# Patient Record
Sex: Male | Born: 1950
Health system: Southern US, Community
[De-identification: ages and names within clinical notes are randomized; demographics above are authoritative.]

## PROBLEM LIST (undated history)

## (undated) DIAGNOSIS — N201 Calculus of ureter: Secondary | ICD-10-CM

## (undated) DIAGNOSIS — R399 Unspecified symptoms and signs involving the genitourinary system: Secondary | ICD-10-CM

## (undated) DIAGNOSIS — Z86010 Personal history of colonic polyps: Secondary | ICD-10-CM

## (undated) DIAGNOSIS — R030 Elevated blood-pressure reading, without diagnosis of hypertension: Secondary | ICD-10-CM

## (undated) DIAGNOSIS — M1990 Unspecified osteoarthritis, unspecified site: Secondary | ICD-10-CM

## (undated) DIAGNOSIS — E291 Testicular hypofunction: Secondary | ICD-10-CM

## (undated) DIAGNOSIS — I10 Essential (primary) hypertension: Secondary | ICD-10-CM

## (undated) DIAGNOSIS — Z87442 Personal history of urinary calculi: Secondary | ICD-10-CM

## (undated) DIAGNOSIS — M545 Low back pain, unspecified: Secondary | ICD-10-CM

## (undated) DIAGNOSIS — Z860101 Personal history of adenomatous and serrated colon polyps: Secondary | ICD-10-CM

## (undated) DIAGNOSIS — I4510 Unspecified right bundle-branch block: Secondary | ICD-10-CM

## (undated) DIAGNOSIS — C679 Malignant neoplasm of bladder, unspecified: Secondary | ICD-10-CM

## (undated) DIAGNOSIS — G8929 Other chronic pain: Secondary | ICD-10-CM

## (undated) DIAGNOSIS — N400 Enlarged prostate without lower urinary tract symptoms: Secondary | ICD-10-CM

## (undated) DIAGNOSIS — E7800 Pure hypercholesterolemia, unspecified: Secondary | ICD-10-CM

## (undated) DIAGNOSIS — N529 Male erectile dysfunction, unspecified: Secondary | ICD-10-CM

## (undated) HISTORY — DX: Essential (primary) hypertension: I10

## (undated) HISTORY — PX: TONSILLECTOMY: SUR1361

## (undated) HISTORY — PX: COLONOSCOPY: SHX174

## (undated) HISTORY — PX: REFRACTIVE SURGERY: SHX103

---

## 1987-09-27 HISTORY — PX: TRANSURETHRAL RESECTION OF BLADDER TUMOR WITH GYRUS (TURBT-GYRUS): SHX6458

## 1987-09-27 HISTORY — PX: CYSTOSCOPY/RETROGRADE/URETEROSCOPY/STONE EXTRACTION WITH BASKET: SHX5317

## 1998-09-16 ENCOUNTER — Emergency Department (HOSPITAL_COMMUNITY): Admission: EM | Admit: 1998-09-16 | Discharge: 1998-09-16 | Payer: Self-pay | Admitting: Emergency Medicine

## 1999-05-02 ENCOUNTER — Encounter: Payer: Self-pay | Admitting: Urology

## 1999-05-02 ENCOUNTER — Emergency Department (HOSPITAL_COMMUNITY): Admission: EM | Admit: 1999-05-02 | Discharge: 1999-05-02 | Payer: Self-pay | Admitting: Emergency Medicine

## 2000-09-06 ENCOUNTER — Encounter: Payer: Self-pay | Admitting: Urology

## 2000-09-06 ENCOUNTER — Encounter: Admission: RE | Admit: 2000-09-06 | Discharge: 2000-09-06 | Payer: Self-pay | Admitting: Urology

## 2000-09-12 ENCOUNTER — Encounter: Admission: RE | Admit: 2000-09-12 | Discharge: 2000-09-12 | Payer: Self-pay | Admitting: Urology

## 2000-09-12 ENCOUNTER — Encounter: Payer: Self-pay | Admitting: Urology

## 2000-09-26 HISTORY — PX: PROSTATE SURGERY: SHX751

## 2002-10-28 ENCOUNTER — Encounter: Admission: RE | Admit: 2002-10-28 | Discharge: 2002-10-28 | Payer: Self-pay | Admitting: Urology

## 2002-10-28 ENCOUNTER — Encounter: Payer: Self-pay | Admitting: Urology

## 2003-10-20 ENCOUNTER — Ambulatory Visit (HOSPITAL_COMMUNITY): Admission: RE | Admit: 2003-10-20 | Discharge: 2003-10-20 | Payer: Self-pay | Admitting: Gastroenterology

## 2005-06-24 ENCOUNTER — Ambulatory Visit (HOSPITAL_COMMUNITY): Admission: RE | Admit: 2005-06-24 | Discharge: 2005-06-24 | Payer: Self-pay | Admitting: Family Medicine

## 2005-06-28 ENCOUNTER — Emergency Department (HOSPITAL_COMMUNITY): Admission: EM | Admit: 2005-06-28 | Discharge: 2005-06-28 | Payer: Self-pay | Admitting: Emergency Medicine

## 2008-04-15 ENCOUNTER — Ambulatory Visit: Payer: Self-pay | Admitting: Internal Medicine

## 2008-04-29 ENCOUNTER — Encounter: Payer: Self-pay | Admitting: Internal Medicine

## 2008-04-29 ENCOUNTER — Ambulatory Visit: Payer: Self-pay | Admitting: Internal Medicine

## 2008-05-01 ENCOUNTER — Encounter: Payer: Self-pay | Admitting: Internal Medicine

## 2011-09-27 HISTORY — PX: SHOULDER ARTHROSCOPY WITH SUBACROMIAL DECOMPRESSION, ROTATOR CUFF REPAIR AND BICEP TENDON REPAIR: SHX5687

## 2012-05-18 ENCOUNTER — Ambulatory Visit (HOSPITAL_COMMUNITY)
Admission: RE | Admit: 2012-05-18 | Discharge: 2012-05-18 | Disposition: A | Discharge status: Home / Self Care | Payer: Worker's Compensation | Source: Ambulatory Visit | Attending: Orthopedic Surgery | Admitting: Orthopedic Surgery

## 2012-05-18 ENCOUNTER — Other Ambulatory Visit (HOSPITAL_COMMUNITY): Payer: Self-pay | Admitting: Orthopedic Surgery

## 2012-05-18 DIAGNOSIS — T1590XA Foreign body on external eye, part unspecified, unspecified eye, initial encounter: Secondary | ICD-10-CM

## 2012-05-18 DIAGNOSIS — Z1389 Encounter for screening for other disorder: Secondary | ICD-10-CM | POA: Insufficient documentation

## 2012-10-05 ENCOUNTER — Other Ambulatory Visit (HOSPITAL_COMMUNITY): Payer: Self-pay | Admitting: Cardiovascular Disease

## 2012-10-05 DIAGNOSIS — I10 Essential (primary) hypertension: Secondary | ICD-10-CM

## 2012-10-05 DIAGNOSIS — I4510 Unspecified right bundle-branch block: Secondary | ICD-10-CM

## 2012-10-05 DIAGNOSIS — IMO0001 Reserved for inherently not codable concepts without codable children: Secondary | ICD-10-CM

## 2012-10-05 DIAGNOSIS — I714 Abdominal aortic aneurysm, without rupture: Secondary | ICD-10-CM

## 2012-10-05 DIAGNOSIS — I493 Ventricular premature depolarization: Secondary | ICD-10-CM

## 2012-10-16 ENCOUNTER — Ambulatory Visit (HOSPITAL_COMMUNITY)
Admission: RE | Admit: 2012-10-16 | Discharge: 2012-10-16 | Disposition: A | Discharge status: Home / Self Care | Payer: BC Managed Care – PPO | Source: Ambulatory Visit | Attending: Cardiovascular Disease | Admitting: Cardiovascular Disease

## 2012-10-16 DIAGNOSIS — I493 Ventricular premature depolarization: Secondary | ICD-10-CM

## 2012-10-16 DIAGNOSIS — I10 Essential (primary) hypertension: Secondary | ICD-10-CM

## 2012-10-16 DIAGNOSIS — I4510 Unspecified right bundle-branch block: Secondary | ICD-10-CM

## 2012-10-16 DIAGNOSIS — Z8249 Family history of ischemic heart disease and other diseases of the circulatory system: Secondary | ICD-10-CM | POA: Insufficient documentation

## 2012-10-16 HISTORY — PX: TRANSTHORACIC ECHOCARDIOGRAM: SHX275

## 2012-10-16 HISTORY — PX: CARDIOVASCULAR STRESS TEST: SHX262

## 2012-10-16 MED ORDER — TECHNETIUM TC 99M SESTAMIBI GENERIC - CARDIOLITE
10.8000 | Freq: Once | INTRAVENOUS | Status: AC | PRN
  Administered 2012-10-16: 11 via INTRAVENOUS

## 2012-10-16 MED ORDER — TECHNETIUM TC 99M SESTAMIBI GENERIC - CARDIOLITE
30.5000 | Freq: Once | INTRAVENOUS | Status: AC | PRN
  Administered 2012-10-16: 31 via INTRAVENOUS

## 2012-10-16 NOTE — Progress Notes (Signed)
2D Echo Performed 08/26/2013    Myrakle Wingler, RCS.  

## 2012-10-16 NOTE — Procedures (Addendum)
[facility] [address] [place] [address] [place] [address] [phone]  Cardiology Nuclear Med Study  Ryan Scott is a 62 y.o. male     MRN : [id number]     DOB: [birth year]  Procedure Date: 10/16/2012  Nuclear Med Background Indication for Stress Test:  Evaluation for Ischemia History:  no prior cardiac history Cardiac Risk Factors: Family History - CAD, Hypertension, Lipids and Overweight  Symptoms:  none   Nuclear Pre-Procedure Caffeine/Decaff Intake:  7:00pm NPO After: 5:00am   IV Site: R Antecubital  IV 0.9% NS with Angio Cath:  22g  Chest Size (in):  44 IV Started by: Koren Shiver, CNMT  Height: 5\' 10"  (1.778 m)  Cup Size: n/a  BMI:  Body mass index is 34.72 kg/(m^2). Weight:  242 lb (109.77 kg)   Tech Comments:  n/a    Nuclear Med Study 1 or 2 day study: 1 day  Stress Test Type:  Stress  Order Authorizing Provider:  Susa Griffins, MD   Resting Radionuclide: Technetium 23m Sestamibi  Resting Radionuclide Dose: 10.8 mCi   Stress Radionuclide:  Technetium 54m Sestamibi  Stress Radionuclide Dose: 30.5 mCi           Stress Protocol Rest HR: 59 Stress HR: 144  Rest BP: 144/95 Stress BP: 206/106  Exercise Time (min): 11:00 METS: 13.4   Predicted Max HR: 158 bpm % Max HR: 91.14 bpm Rate Pressure Product: 21308   Dose of Adenosine (mg):  n/a Dose of Lexiscan: n/a mg  Dose of Atropine (mg): n/a Dose of Dobutamine: n/a mcg/kg/min (at max HR)  Stress Test Technologist: Esperanza Sheets, CCT Nuclear Technologist: Gonzella Lex, CNMT   Rest Procedure:  Myocardial perfusion imaging was performed at rest 45 minutes following the intravenous administration of Technetium 45m Sestamibi. Stress Procedure:  The patient performed treadmill exercise using a Bruce  Protocol for 11:00 minutes. The patient stopped due to SOB and Leg Fatigue and denied any chest pain.  There were no significant ST-T wave changes.  Technetium 59m  Sestamibi was injected at peak exercise and myocardial perfusion imaging was performed after a brief delay.  Transient Ischemic Dilatation (Normal <1.22):  1.03 Lung/Heart Ratio (Normal <0.45):  0.35 QGS EDV:  n/a ml QGS ESV:  n/a ml LV Ejection Fraction: Study not gated  Signed by [name], CNMT  Rest ECG: NSR-RSR' / Incomplete RBBB  Frequent PVCs  No acute changes  Stress ECG: No significant ST segment change suggestive of ischemia. and there are scattered PVCs that decrease with frequency by mid exercise.  QPS Raw Data Images:  There is increased tracer uptake in either the Left Hepatic lobe or splenic flexure of the colon that likely attenuates inferoseptal scintillation. Stress Images:  There is decreased uptake in the basal infero-septal wall. There is a small to medium sized, mild intensity perfusion defect noted in the basal inferoseptal wall. Rest Images:  Comparison with the stress images reveals no significant change. Subtraction (SDS):  There is a fixed inferior defect that is most consistent with diaphragmatic / gut attenuation. No reversibility is appreciated. Without the benefit of gated imaging, unable to determine if this defect is infarct vs. Attenuation artifact.  Impression Exercise Capacity:  Excellent exercise capacity. BP Response:  Hypertensive blood pressure response. Clinical Symptoms:  There is dyspnea. ECG Impression:  No significant ST segment change suggestive of ischemia. There are scattered PVCs. The PVCs were more frequent during initial stages of exercise and  recovery, decreasing in frequency with increased exercise. LV Wall Motion:  Not evaluated - non-gated due to PVCs.  Comparison with Prior Nuclear Study: No previous nuclear study performed  Overall Impression:  Low risk stress nuclear study.  No scintigraphic evidence of myocardial ischemia.   Marykay Lex, MD  10/16/2012 1:22 PM

## 2012-10-30 ENCOUNTER — Ambulatory Visit (HOSPITAL_COMMUNITY)
Admission: RE | Admit: 2012-10-30 | Discharge: 2012-10-30 | Disposition: A | Discharge status: Home / Self Care | Payer: BC Managed Care – PPO | Source: Ambulatory Visit | Attending: Cardiovascular Disease | Admitting: Cardiovascular Disease

## 2012-10-30 DIAGNOSIS — I714 Abdominal aortic aneurysm, without rupture, unspecified: Secondary | ICD-10-CM | POA: Insufficient documentation

## 2012-10-30 DIAGNOSIS — I10 Essential (primary) hypertension: Secondary | ICD-10-CM

## 2012-10-30 DIAGNOSIS — IMO0001 Reserved for inherently not codable concepts without codable children: Secondary | ICD-10-CM

## 2012-10-30 NOTE — Progress Notes (Signed)
Renal Duplex Completed. Ryan Scott  

## 2013-01-29 ENCOUNTER — Encounter: Payer: Self-pay | Admitting: Cardiovascular Disease

## 2013-01-31 ENCOUNTER — Encounter: Payer: Self-pay | Admitting: Family Medicine

## 2013-01-31 ENCOUNTER — Encounter: Payer: Self-pay | Admitting: *Deleted

## 2013-01-31 ENCOUNTER — Ambulatory Visit (INDEPENDENT_AMBULATORY_CARE_PROVIDER_SITE_OTHER): Payer: BC Managed Care – PPO | Admitting: Family Medicine

## 2013-01-31 VITALS — BP 130/86 | Ht 70.0 in | Wt 237.0 lb

## 2013-01-31 DIAGNOSIS — G5602 Carpal tunnel syndrome, left upper limb: Secondary | ICD-10-CM

## 2013-01-31 DIAGNOSIS — G5600 Carpal tunnel syndrome, unspecified upper limb: Secondary | ICD-10-CM

## 2013-01-31 NOTE — Progress Notes (Signed)
  Subjective:    Patient ID: Ryan Scott, male    DOB: 12-30-1950, 62 y.o.   MRN: [id number]  HPI This patient has intermittent hand pain along with numbness tingling it's been going on for the past several months.. To some degree he has had off and on for several months. Progressively worse. He has worn braces at nighttime for years without much help.he relates that it's very difficult to grip it's difficult to manipulate items lift and push and pull. He sees a heart doctor for his blood pressure and a urologist for his prostate as well as testosterone shots. He denies wheezing or shortness of breath chest pain denies any neck pain. He is disabled because of his left shoulder has 30% disability with it.   Review of Systemssee above.     Objective:   Physical Exam Blood pressure fine. Lungs clear heart regular. Positive Tinel. No sign of atrophy. Strength in the hands fair       Assessment & Plan:  Work difficulties-given the discomforts he is having in his hands with gripping pulling and manipulation it would be difficult for this patient to work a regular job.  Probable carpal tunnel syndrome-referral for nerve conduction studies. Once these are back most likely referral to a hand specialist possibly Dr. Merlyn Lot.

## 2013-03-05 ENCOUNTER — Ambulatory Visit (INDEPENDENT_AMBULATORY_CARE_PROVIDER_SITE_OTHER): Payer: BC Managed Care – PPO | Admitting: Family Medicine

## 2013-03-05 ENCOUNTER — Encounter: Payer: Self-pay | Admitting: Family Medicine

## 2013-03-05 VITALS — BP 114/81 | HR 80 | Wt 239.4 lb

## 2013-03-05 DIAGNOSIS — G5600 Carpal tunnel syndrome, unspecified upper limb: Secondary | ICD-10-CM

## 2013-03-05 NOTE — Progress Notes (Signed)
  Subjective:    Patient ID: Ryan Scott, male    DOB: 1951/08/13, 62 y.o.   MRN: [id number]  HPI I reviewed with the patient the results of this test 10-15 minutes spent with patient discussing treatment options including injections and treatment. No other particular problems otherwise. Shows mild symptoms but is worse on the right than the left there is no strength loss on physical exam we will go ahead and refer him to orthopedics   Review of Systems     Objective:   Physical Exam        Assessment & Plan:  Carpal tunnel-referral to orthopedics more than likely they will recommend surgery on the right first than the left

## 2013-03-07 ENCOUNTER — Other Ambulatory Visit: Payer: Self-pay | Admitting: Family Medicine

## 2013-03-07 DIAGNOSIS — G5601 Carpal tunnel syndrome, right upper limb: Secondary | ICD-10-CM

## 2013-04-01 DIAGNOSIS — Z0289 Encounter for other administrative examinations: Secondary | ICD-10-CM

## 2013-05-06 ENCOUNTER — Encounter: Payer: Self-pay | Admitting: Internal Medicine

## 2013-05-09 ENCOUNTER — Encounter: Payer: Self-pay | Admitting: Gastroenterology

## 2013-05-16 ENCOUNTER — Other Ambulatory Visit: Payer: Self-pay | Admitting: Cardiovascular Disease

## 2013-05-16 LAB — COMPREHENSIVE METABOLIC PANEL
ALT: 20 U/L (ref 0–53)
AST: 19 U/L (ref 0–37)
Alkaline Phosphatase: 44 U/L (ref 39–117)
BUN: 19 mg/dL (ref 6–23)
Calcium: 10 mg/dL (ref 8.4–10.5)
Chloride: 103 mEq/L (ref 96–112)
Creat: 1.36 mg/dL — ABNORMAL HIGH (ref 0.50–1.35)
Total Bilirubin: 1.6 mg/dL — ABNORMAL HIGH (ref 0.3–1.2)

## 2013-05-16 LAB — CBC WITH DIFFERENTIAL/PLATELET
Hemoglobin: 17.2 g/dL — ABNORMAL HIGH (ref 13.0–17.0)
MCHC: 34.5 g/dL (ref 30.0–36.0)
MCV: 87.1 fL (ref 78.0–100.0)
Monocytes Absolute: 0.7 10*3/uL (ref 0.1–1.0)
Monocytes Relative: 10 % (ref 3–12)
RBC: 5.73 MIL/uL (ref 4.22–5.81)
RDW: 14.3 % (ref 11.5–15.5)
WBC: 6.9 10*3/uL (ref 4.0–10.5)

## 2013-07-03 ENCOUNTER — Encounter: Payer: Self-pay | Admitting: Internal Medicine

## 2013-07-03 ENCOUNTER — Ambulatory Visit (AMBULATORY_SURGERY_CENTER): Payer: Self-pay | Admitting: *Deleted

## 2013-07-03 VITALS — Ht 69.0 in | Wt 233.4 lb

## 2013-07-03 DIAGNOSIS — Z86010 Personal history of colonic polyps: Secondary | ICD-10-CM

## 2013-07-03 MED ORDER — PEG-KCL-NACL-NASULF-NA ASC-C 100 G PO SOLR: ORAL | Status: DC

## 2013-07-03 NOTE — Progress Notes (Signed)
No egg or soy allergy 

## 2013-07-17 ENCOUNTER — Encounter: Payer: Self-pay | Admitting: Internal Medicine

## 2013-07-26 ENCOUNTER — Ambulatory Visit (AMBULATORY_SURGERY_CENTER): Payer: BC Managed Care – PPO | Admitting: Internal Medicine

## 2013-07-26 ENCOUNTER — Encounter: Payer: Self-pay | Admitting: Internal Medicine

## 2013-07-26 VITALS — BP 111/68 | HR 55 | Temp 96.8°F | Resp 24 | Ht 69.0 in | Wt 233.0 lb

## 2013-07-26 DIAGNOSIS — D126 Benign neoplasm of colon, unspecified: Secondary | ICD-10-CM

## 2013-07-26 DIAGNOSIS — Z86010 Personal history of colonic polyps: Secondary | ICD-10-CM

## 2013-07-26 MED ORDER — SODIUM CHLORIDE 0.9 % IV SOLN: 500.0000 mL | INTRAVENOUS | Status: DC

## 2013-07-26 NOTE — Progress Notes (Signed)
Patient did not experience any of the following events: a burn prior to discharge; a fall within the facility; wrong site/side/patient/procedure/implant event; or a hospital transfer or hospital admission upon discharge from the facility. (G8907) Patient did not have preoperative order for IV antibiotic SSI prophylaxis. (G8918)  

## 2013-07-26 NOTE — Op Note (Signed)
[facility] [address] [place], [address]   COLONOSCOPY PROCEDURE REPORT  PATIENT: Ryan Scott, Ryan Scott  MR#: [id number] BIRTHDATE: 1951-01-26 , 62  yrs. old GENDER: Male ENDOSCOPIST: Hart Carwin, MD REFERRED AO:ZHYQM Gerda Diss, M.D. PROCEDURE DATE:  07/26/2013 PROCEDURE:   Colonoscopy with snare polypectomy and Colonoscopy with cold biopsy polypectomy First Screening Colonoscopy - Avg.  risk and is 50 yrs.  old or older - No.  Prior Negative Screening - Now for repeat screening. N/A  History of Adenoma - Now for follow-up colonoscopy & has been > or = to 3 yrs.  Yes hx of adenoma.  Has been 3 or more years since last colonoscopy.  Polyps Removed Today? Yes. ASA CLASS:   Class II INDICATIONS:adenomatous polyp removed in August 2009, hyperplastic polyp removed in 2004. MEDICATIONS: MAC sedation, administered by CRNA and propofol (Diprivan) 250mg  IV  DESCRIPTION OF PROCEDURE:   After the risks benefits and alternatives of the procedure were thoroughly explained, informed consent was obtained.  A digital rectal exam revealed no abnormalities of the rectum.   The     endoscope was introduced through the anus and advanced to the cecum, which was identified by both the appendix and ileocecal valve. No adverse events experienced.   The quality of the prep was good, using MoviPrep The instrument was then slowly withdrawn as the colon was fully examined.      COLON FINDINGS: Two polypoid shaped sessile polyps ranging between 3-45mm in size were found in the ascending colon.  A polypectomy was performed with cold forceps and with a cold snare.  The resection was complete and the polyp tissue was partially retrieved. Retroflexed views revealed no abnormalities. The time to cecum=5 minutes 39 seconds.  Withdrawal time=8 minutes 52 seconds.  The scope was withdrawn and the procedure completed. COMPLICATIONS: There were no complications.  ENDOSCOPIC IMPRESSION: Two  sessile polyps ranging between 3-63mm in size were found in the ascending colon; polypectomy was performed with cold forceps and with a cold snare , only one polyp retrieved  RECOMMENDATIONS: 1.  Await pathology results 2.  High fiber diet   eSigned:  Hart Carwin, MD 07/26/2013 10:52 AM   cc:   PATIENT NAME:  Ricardo, Schubach MR#: [id number]

## 2013-07-26 NOTE — Patient Instructions (Signed)
YOU HAD AN ENDOSCOPIC PROCEDURE TODAY AT THE [facility]: Refer to the procedure report that was given to you for any specific questions about what was found during the examination.  If the procedure report does not answer your questions, please call your gastroenterologist to clarify.  If you requested that your care partner not be given the details of your procedure findings, then the procedure report has been included in a sealed envelope for you to review at your convenience later.  YOU SHOULD EXPECT: Some feelings of bloating in the abdomen. Passage of more gas than usual.  Walking can help get rid of the air that was put into your GI tract during the procedure and reduce the bloating. If you had a lower endoscopy (such as a colonoscopy or flexible sigmoidoscopy) you may notice spotting of blood in your stool or on the toilet paper. If you underwent a bowel prep for your procedure, then you may not have a normal bowel movement for a few days.  DIET: Your first meal following the procedure should be a light meal and then it is ok to progress to your normal diet.  A half-sandwich or bowl of soup is an example of a good first meal.  Heavy or fried foods are harder to digest and may make you feel nauseous or bloated.  Likewise meals heavy in dairy and vegetables can cause extra gas to form and this can also increase the bloating.  Drink plenty of fluids but you should avoid alcoholic beverages for 24 hours.  ACTIVITY: Your care partner should take you home directly after the procedure.  You should plan to take it easy, moving slowly for the rest of the day.  You can resume normal activity the day after the procedure however you should NOT DRIVE or use heavy machinery for 24 hours (because of the sedation medicines used during the test).    SYMPTOMS TO REPORT IMMEDIATELY: A gastroenterologist can be reached at any hour.  During normal business hours, 8:30 AM to 5:00 PM Monday through Friday,  call [phone].  After hours and on weekends, please call the GI answering service at [phone] who will take a message and have the physician on call contact you.   Following lower endoscopy (colonoscopy or flexible sigmoidoscopy):  Excessive amounts of blood in the stool  Significant tenderness or worsening of abdominal pains  Swelling of the abdomen that is new, acute  Fever of 100F or higher    FOLLOW UP: If any biopsies were taken you will be contacted by phone or by letter within the next 1-3 weeks.  Call your gastroenterologist if you have not heard about the biopsies in 3 weeks.  Our staff will call the home number listed on your records the next business day following your procedure to check on you and address any questions or concerns that you may have at that time regarding the information given to you following your procedure. This is a courtesy call and so if there is no answer at the home number and we have not heard from you through the emergency physician on call, we will assume that you have returned to your regular daily activities without incident.  SIGNATURES/CONFIDENTIALITY: You and/or your care partner have signed paperwork which will be entered into your electronic medical record.  These signatures attest to the fact that that the information above on your After Visit Summary has been reviewed and is understood.  Full responsibility of the confidentiality   of this discharge information lies with you and/or your care-partner.   Information on polyps & high fiber diet  given to you today    

## 2013-07-26 NOTE — Progress Notes (Signed)
Called to room to assist during endoscopic procedure.  Patient ID and intended procedure confirmed with present staff. Received instructions for my participation in the procedure from the performing physician.  

## 2013-07-29 ENCOUNTER — Telehealth: Payer: Self-pay | Admitting: *Deleted

## 2013-07-29 NOTE — Telephone Encounter (Signed)
  Follow up Call-  Call back number 07/26/2013  Post procedure Call Back phone  # [phone]  Permission to leave phone message Yes    No answer,left message.

## 2013-07-30 ENCOUNTER — Encounter: Payer: Self-pay | Admitting: Internal Medicine

## 2015-05-07 ENCOUNTER — Other Ambulatory Visit: Payer: Self-pay | Admitting: Urology

## 2015-05-07 ENCOUNTER — Encounter (HOSPITAL_BASED_OUTPATIENT_CLINIC_OR_DEPARTMENT_OTHER): Payer: Self-pay | Admitting: *Deleted

## 2015-05-08 ENCOUNTER — Encounter (HOSPITAL_BASED_OUTPATIENT_CLINIC_OR_DEPARTMENT_OTHER): Payer: Self-pay | Admitting: *Deleted

## 2015-05-08 NOTE — Progress Notes (Signed)
NPO AFTER MN.  ARRIVE AT 0600.  NEEDS HG AND EKG.  WILL TAKE FLOMAX AND IF NEEDED TAKE OXYCODONE AM DOS W/ SIPS OF WATER.

## 2015-05-11 ENCOUNTER — Ambulatory Visit (HOSPITAL_BASED_OUTPATIENT_CLINIC_OR_DEPARTMENT_OTHER): Admission: RE | Admit: 2015-05-11 | Payer: BLUE CROSS/BLUE SHIELD | Source: Ambulatory Visit | Admitting: Urology

## 2015-05-11 HISTORY — DX: Calculus of ureter: N20.1

## 2015-05-11 HISTORY — DX: Personal history of adenomatous and serrated colon polyps: Z86.0101

## 2015-05-11 HISTORY — DX: Personal history of colonic polyps: Z86.010

## 2015-05-11 HISTORY — DX: Benign prostatic hyperplasia without lower urinary tract symptoms: N40.0

## 2015-05-11 HISTORY — DX: Unspecified symptoms and signs involving the genitourinary system: R39.9

## 2015-05-11 HISTORY — DX: Male erectile dysfunction, unspecified: N52.9

## 2015-05-11 HISTORY — DX: Personal history of urinary calculi: Z87.442

## 2015-05-11 HISTORY — DX: Unspecified right bundle-branch block: I45.10

## 2015-05-11 HISTORY — DX: Testicular hypofunction: E29.1

## 2015-05-11 HISTORY — DX: Elevated blood-pressure reading, without diagnosis of hypertension: R03.0

## 2015-05-11 SURGERY — CYSTOURETEROSCOPY, WITH RETROGRADE PYELOGRAM AND STENT INSERTION
Anesthesia: General | Laterality: Right

## 2015-10-16 DIAGNOSIS — Z0000 Encounter for general adult medical examination without abnormal findings: Secondary | ICD-10-CM | POA: Diagnosis not present

## 2015-10-16 DIAGNOSIS — R030 Elevated blood-pressure reading, without diagnosis of hypertension: Secondary | ICD-10-CM | POA: Diagnosis not present

## 2015-10-16 DIAGNOSIS — R911 Solitary pulmonary nodule: Secondary | ICD-10-CM | POA: Diagnosis not present

## 2015-10-16 DIAGNOSIS — Z23 Encounter for immunization: Secondary | ICD-10-CM | POA: Diagnosis not present

## 2015-10-16 DIAGNOSIS — E291 Testicular hypofunction: Secondary | ICD-10-CM | POA: Diagnosis not present

## 2015-10-16 DIAGNOSIS — Z136 Encounter for screening for cardiovascular disorders: Secondary | ICD-10-CM | POA: Diagnosis not present

## 2015-10-19 DIAGNOSIS — R350 Frequency of micturition: Secondary | ICD-10-CM | POA: Diagnosis not present

## 2015-10-19 DIAGNOSIS — N401 Enlarged prostate with lower urinary tract symptoms: Secondary | ICD-10-CM | POA: Diagnosis not present

## 2015-10-19 DIAGNOSIS — E291 Testicular hypofunction: Secondary | ICD-10-CM | POA: Diagnosis not present

## 2015-10-19 DIAGNOSIS — Z0000 Encounter for general adult medical examination without abnormal findings: Secondary | ICD-10-CM | POA: Diagnosis not present

## 2015-11-20 DIAGNOSIS — R350 Frequency of micturition: Secondary | ICD-10-CM | POA: Diagnosis not present

## 2015-12-18 DIAGNOSIS — E291 Testicular hypofunction: Secondary | ICD-10-CM | POA: Diagnosis not present

## 2016-01-15 DIAGNOSIS — E291 Testicular hypofunction: Secondary | ICD-10-CM | POA: Diagnosis not present

## 2016-02-16 DIAGNOSIS — E291 Testicular hypofunction: Secondary | ICD-10-CM | POA: Diagnosis not present

## 2016-02-18 DIAGNOSIS — N200 Calculus of kidney: Secondary | ICD-10-CM | POA: Diagnosis not present

## 2016-03-18 DIAGNOSIS — E291 Testicular hypofunction: Secondary | ICD-10-CM | POA: Diagnosis not present

## 2016-04-12 DIAGNOSIS — E790 Hyperuricemia without signs of inflammatory arthritis and tophaceous disease: Secondary | ICD-10-CM | POA: Diagnosis not present

## 2016-04-19 DIAGNOSIS — N5201 Erectile dysfunction due to arterial insufficiency: Secondary | ICD-10-CM | POA: Diagnosis not present

## 2016-04-19 DIAGNOSIS — N200 Calculus of kidney: Secondary | ICD-10-CM | POA: Diagnosis not present

## 2016-04-19 DIAGNOSIS — N401 Enlarged prostate with lower urinary tract symptoms: Secondary | ICD-10-CM | POA: Diagnosis not present

## 2016-04-19 DIAGNOSIS — E291 Testicular hypofunction: Secondary | ICD-10-CM | POA: Diagnosis not present

## 2016-05-05 DIAGNOSIS — M7742 Metatarsalgia, left foot: Secondary | ICD-10-CM | POA: Diagnosis not present

## 2016-05-20 DIAGNOSIS — E291 Testicular hypofunction: Secondary | ICD-10-CM | POA: Diagnosis not present

## 2016-06-21 DIAGNOSIS — E291 Testicular hypofunction: Secondary | ICD-10-CM | POA: Diagnosis not present

## 2016-07-19 DIAGNOSIS — E291 Testicular hypofunction: Secondary | ICD-10-CM | POA: Diagnosis not present

## 2016-08-17 DIAGNOSIS — E291 Testicular hypofunction: Secondary | ICD-10-CM | POA: Diagnosis not present

## 2016-09-15 ENCOUNTER — Other Ambulatory Visit: Payer: Self-pay | Admitting: Family Medicine

## 2016-09-15 DIAGNOSIS — R911 Solitary pulmonary nodule: Secondary | ICD-10-CM

## 2016-09-16 DIAGNOSIS — E291 Testicular hypofunction: Secondary | ICD-10-CM | POA: Diagnosis not present

## 2016-09-23 ENCOUNTER — Other Ambulatory Visit: Payer: Self-pay | Admitting: Family Medicine

## 2016-09-23 DIAGNOSIS — R911 Solitary pulmonary nodule: Secondary | ICD-10-CM

## 2016-09-27 ENCOUNTER — Ambulatory Visit
Admission: RE | Admit: 2016-09-27 | Discharge: 2016-09-27 | Disposition: A | Discharge status: Home / Self Care | Payer: PPO | Source: Ambulatory Visit | Attending: Family Medicine | Admitting: Family Medicine

## 2016-09-27 DIAGNOSIS — R918 Other nonspecific abnormal finding of lung field: Secondary | ICD-10-CM | POA: Diagnosis not present

## 2016-09-27 DIAGNOSIS — R911 Solitary pulmonary nodule: Secondary | ICD-10-CM

## 2016-10-18 DIAGNOSIS — E291 Testicular hypofunction: Secondary | ICD-10-CM | POA: Diagnosis not present

## 2016-10-19 DIAGNOSIS — Z8551 Personal history of malignant neoplasm of bladder: Secondary | ICD-10-CM | POA: Diagnosis not present

## 2016-10-19 DIAGNOSIS — Z23 Encounter for immunization: Secondary | ICD-10-CM | POA: Diagnosis not present

## 2016-10-19 DIAGNOSIS — N5201 Erectile dysfunction due to arterial insufficiency: Secondary | ICD-10-CM | POA: Diagnosis not present

## 2016-10-19 DIAGNOSIS — N183 Chronic kidney disease, stage 3 (moderate): Secondary | ICD-10-CM | POA: Diagnosis not present

## 2016-10-19 DIAGNOSIS — Z0000 Encounter for general adult medical examination without abnormal findings: Secondary | ICD-10-CM | POA: Diagnosis not present

## 2016-10-19 DIAGNOSIS — E7800 Pure hypercholesterolemia, unspecified: Secondary | ICD-10-CM | POA: Diagnosis not present

## 2016-10-19 DIAGNOSIS — K1370 Unspecified lesions of oral mucosa: Secondary | ICD-10-CM | POA: Diagnosis not present

## 2016-11-22 DIAGNOSIS — E291 Testicular hypofunction: Secondary | ICD-10-CM | POA: Diagnosis not present

## 2016-11-22 DIAGNOSIS — N401 Enlarged prostate with lower urinary tract symptoms: Secondary | ICD-10-CM | POA: Diagnosis not present

## 2016-12-20 DIAGNOSIS — E291 Testicular hypofunction: Secondary | ICD-10-CM | POA: Diagnosis not present

## 2017-01-20 DIAGNOSIS — E291 Testicular hypofunction: Secondary | ICD-10-CM | POA: Diagnosis not present

## 2017-02-17 DIAGNOSIS — E291 Testicular hypofunction: Secondary | ICD-10-CM | POA: Diagnosis not present

## 2017-03-21 DIAGNOSIS — E291 Testicular hypofunction: Secondary | ICD-10-CM | POA: Diagnosis not present

## 2017-04-18 DIAGNOSIS — E291 Testicular hypofunction: Secondary | ICD-10-CM | POA: Diagnosis not present

## 2017-05-01 DIAGNOSIS — N401 Enlarged prostate with lower urinary tract symptoms: Secondary | ICD-10-CM | POA: Diagnosis not present

## 2017-05-08 DIAGNOSIS — R350 Frequency of micturition: Secondary | ICD-10-CM | POA: Diagnosis not present

## 2017-05-08 DIAGNOSIS — E291 Testicular hypofunction: Secondary | ICD-10-CM | POA: Diagnosis not present

## 2017-05-08 DIAGNOSIS — N401 Enlarged prostate with lower urinary tract symptoms: Secondary | ICD-10-CM | POA: Diagnosis not present

## 2017-05-08 DIAGNOSIS — N5201 Erectile dysfunction due to arterial insufficiency: Secondary | ICD-10-CM | POA: Diagnosis not present

## 2017-05-19 DIAGNOSIS — E291 Testicular hypofunction: Secondary | ICD-10-CM | POA: Diagnosis not present

## 2017-06-22 DIAGNOSIS — E291 Testicular hypofunction: Secondary | ICD-10-CM | POA: Diagnosis not present

## 2017-07-21 DIAGNOSIS — E291 Testicular hypofunction: Secondary | ICD-10-CM | POA: Diagnosis not present

## 2017-08-28 DIAGNOSIS — M25511 Pain in right shoulder: Secondary | ICD-10-CM | POA: Diagnosis not present

## 2017-08-28 DIAGNOSIS — R140 Abdominal distension (gaseous): Secondary | ICD-10-CM | POA: Diagnosis not present

## 2017-08-28 DIAGNOSIS — Z23 Encounter for immunization: Secondary | ICD-10-CM | POA: Diagnosis not present

## 2017-08-29 ENCOUNTER — Other Ambulatory Visit: Payer: Self-pay | Admitting: Family Medicine

## 2017-08-29 DIAGNOSIS — E291 Testicular hypofunction: Secondary | ICD-10-CM | POA: Diagnosis not present

## 2017-08-29 DIAGNOSIS — R140 Abdominal distension (gaseous): Secondary | ICD-10-CM

## 2017-09-01 ENCOUNTER — Ambulatory Visit
Admission: RE | Admit: 2017-09-01 | Discharge: 2017-09-01 | Disposition: A | Discharge status: Home / Self Care | Payer: PPO | Source: Ambulatory Visit | Attending: Family Medicine | Admitting: Family Medicine

## 2017-09-01 DIAGNOSIS — R140 Abdominal distension (gaseous): Secondary | ICD-10-CM

## 2017-09-01 DIAGNOSIS — N200 Calculus of kidney: Secondary | ICD-10-CM | POA: Diagnosis not present

## 2017-09-06 DIAGNOSIS — M7541 Impingement syndrome of right shoulder: Secondary | ICD-10-CM | POA: Diagnosis not present

## 2017-09-24 DIAGNOSIS — B349 Viral infection, unspecified: Secondary | ICD-10-CM | POA: Diagnosis not present

## 2017-09-26 DIAGNOSIS — I2510 Atherosclerotic heart disease of native coronary artery without angina pectoris: Secondary | ICD-10-CM

## 2017-09-26 HISTORY — DX: Atherosclerotic heart disease of native coronary artery without angina pectoris: I25.10

## 2017-09-29 DIAGNOSIS — E291 Testicular hypofunction: Secondary | ICD-10-CM | POA: Diagnosis not present

## 2017-09-29 DIAGNOSIS — K8020 Calculus of gallbladder without cholecystitis without obstruction: Secondary | ICD-10-CM | POA: Diagnosis not present

## 2017-10-14 DIAGNOSIS — M25551 Pain in right hip: Secondary | ICD-10-CM | POA: Diagnosis not present

## 2017-10-14 DIAGNOSIS — M545 Low back pain: Secondary | ICD-10-CM | POA: Diagnosis not present

## 2017-10-30 ENCOUNTER — Encounter: Payer: Self-pay | Admitting: Gastroenterology

## 2017-10-30 ENCOUNTER — Ambulatory Visit (INDEPENDENT_AMBULATORY_CARE_PROVIDER_SITE_OTHER): Payer: PPO | Admitting: Gastroenterology

## 2017-10-30 VITALS — BP 118/70 | HR 60 | Ht 68.0 in | Wt 237.0 lb

## 2017-10-30 DIAGNOSIS — R140 Abdominal distension (gaseous): Secondary | ICD-10-CM | POA: Diagnosis not present

## 2017-10-30 DIAGNOSIS — R6881 Early satiety: Secondary | ICD-10-CM

## 2017-10-30 NOTE — Patient Instructions (Addendum)
You have been scheduled for an endoscopy. Please follow written instructions given to you at your visit today. If you use inhalers (even only as needed), please bring them with you on the day of your procedure. Your physician has requested that you go to www.startemmi.com and enter the access code given to you at your visit today. This web site gives a general overview about your procedure. However, you should still follow specific instructions given to you by our office regarding your preparation for the procedure.  If you are age 24 or older, your body mass index should be between 23-30. Your Body mass index is 36.04 kg/m. If this is out of the aforementioned range listed, please consider follow up with your Primary Care Provider.  If you are age 52 or younger, your body mass index should be between 19-25. Your Body mass index is 36.04 kg/m. If this is out of the aformentioned range listed, please consider follow up with your Primary Care Provider.   Thank you for choosing [facility]  Dr Wilfrid Lund III

## 2017-10-30 NOTE — Progress Notes (Signed)
[facility] Consult Note:  History: Ryan Scott 10/30/2017  Referring physician: [name], MD  Reason for consult/chief complaint: Bloated (to discuss having an EGD) and Cholelithiasis   Subjective  HPI:  This is a 67 year old man referred by primary care for abdominal pain and bloating.  He was last seen by Dr. Olevia Perches in October 2014 for a routine colonoscopy, at which time 2 subcentimeter polyps were removed, the one retrieved was a tubular adenoma.  He has experienced post-prandial upper abdominal bloating, which led to the [place], then a surgical referral .  Dr. Excell Seltzer was uncertain the symptoms were typical for biliary colic.  He describes about a year of slowly worsening chronic upper abdominal bloating that is worse after meals.  It has not improved after he has changed his eating habits, eating more slowly and having smaller portions.  He has visible distention by report.  Week he denies nausea or vomiting, but he feels like he gets full easily.  There is no clear triggers beforehand such as new medicines or another illness.  While he had some constipation many years ago worked up by Dr. Velora Heckler, his bowel habits have been regular for a long time since then, 1 or 2 formed BMs per day without rectal bleeding.  He denies dysphagia or odynophagia.  ROS:  Review of Systems  Constitutional: Negative for appetite change and unexpected weight change.  HENT: Negative for mouth sores and voice change.   Eyes: Negative for pain and redness.  Respiratory: Negative for cough and shortness of breath.   Cardiovascular: Negative for chest pain and palpitations.  Genitourinary: Negative for dysuria and hematuria.  Musculoskeletal: Negative for arthralgias and myalgias.  Skin: Negative for pallor and rash.  Neurological: Negative for weakness and headaches.  Hematological: Negative for adenopathy.     Past Medical History: Past Medical History:    Diagnosis Date  . Borderline hypertension   . BPH (benign prostatic hyperplasia)   . ED (erectile dysfunction)   . History of adenomatous polyp of colon    tubular adenoma  . History of bladder cancer    hx TURBT 1989  TCC of bladder  . History of kidney stones   . Hypogonadism male   . Left ureteral stone   . Lower urinary tract symptoms (LUTS)   . Nephrolithiasis    bilateral  . RBBB (right bundle branch block)      Past Surgical History: Past Surgical History:  Procedure Laterality Date  . CARDIOVASCULAR STRESS TEST  10-16-2012   normal myocardial perfusion study,  no ischemia/  not gated  . COLONOSCOPY  last one 07-26-2013  . PROSTATE SURGERY  2002  . SHOULDER SURGERY Left 2013  . TONSILLECTOMY    . TRANSTHORACIC ECHOCARDIOGRAM  10-16-2012   normal LVF, ef 55-65%,  mild AV sclerosis without stenosis,  mild MR and TR,  mild LAE,  trivial PR  . TRANSURETHRAL RESECTION OF BLADDER TUMOR WITH GYRUS (TURBT-GYRUS)  1989   and URETEROSCOPIC STONE EXTRACTION  . TRANSURETHRAL RESECTION OF PROSTATE    . UMBILICAL HERNIA REPAIR    . VENTRAL HERNIA REPAIR       Family History: Family History  Problem Relation Age of Onset  . Heart attack Father   . Heart disease Father   . Arthritis Father   . Hypertension Father   . Breast cancer Mother   . Arthritis Sister   . Stroke Maternal Grandmother   . Lung cancer Maternal Grandfather   .  Other Paternal Grandmother        brain tumor  . Aneurysm Paternal Grandfather   . Colon cancer Neg Hx   . Esophageal cancer Neg Hx   . Rectal cancer Neg Hx   . Stomach cancer Neg Hx     Social History: Social History   Socioeconomic History  . Marital status: Married    Spouse name: None  . Number of children: None  . Years of education: None  . Highest education level: None  Social Needs  . Financial resource strain: None  . Food insecurity - worry: None  . Food insecurity - inability: None  . Transportation needs - medical:  None  . Transportation needs - non-medical: None  Occupational History  . None  Tobacco Use  . Smoking status: Never Smoker  . Smokeless tobacco: Former Systems developer    Types: Chew  Substance and Sexual Activity  . Alcohol use: Yes    Comment: occasionally  . Drug use: No  . Sexual activity: None  Other Topics Concern  . None  Social History Narrative  . None    Allergies: No Known Allergies  Outpatient Meds: Current Outpatient Medications  Medication Sig Dispense Refill  . allopurinol (ZYLOPRIM) 100 MG tablet Take 100 mg by mouth daily.    Marland Kitchen aspirin 81 MG tablet Take 81 mg by mouth daily.    . meloxicam (MOBIC) 15 MG tablet Take 15 mg by mouth as needed for pain (for bursitis).    Marland Kitchen testosterone cypionate (DEPOTESTOTERONE CYPIONATE) 200 MG/ML injection Inject 200 mg into the muscle every 28 (twenty-eight) days. Every other week    . valsartan-hydrochlorothiazide (DIOVAN-HCT) 160-12.5 MG tablet Take 1 tablet by mouth daily.     No current facility-administered medications for this visit.       ___________________________________________________________________ Objective   Exam:  BP 118/70 (BP Location: Left Arm, Patient Position: Sitting, Cuff Size: Normal)   Pulse 60 Comment: irregular  Ht 5\' 8"  (1.727 m) Comment: height measured without shoes  Wt 237 lb (107.5 kg)   BMI 36.04 kg/m    General: this is a(n) well-appearing man  Eyes: sclera anicteric, no redness  ENT: oral mucosa moist without lesions, no cervical or supraclavicular lymphadenopathy, good dentition  CV: RRR without murmur, S1/S2, no JVD, no peripheral edema  Resp: clear to auscultation bilaterally, normal RR and effort noted  GI: soft, obese, mild tenderness over the superior aspect of his rectus diastasis, with active bowel sounds of normal character. No guarding or palpable organomegaly noted.  Skin; warm and dry, no rash or jaundice noted  Neuro: awake, alert and oriented x 3. Normal gross  motor function and fluent speech  Labs:  CMP Latest Ref Rng & Units 05/16/2013  Glucose 70 - 99 mg/dL 86  BUN 6 - 23 mg/dL 19  Creatinine 0.50 - 1.35 mg/dL 1.36(H)  Sodium 135 - 145 mEq/L 137  Potassium 3.5 - 5.3 mEq/L 4.4  Chloride 96 - 112 mEq/L 103  CO2 19 - 32 mEq/L 26  Calcium 8.4 - 10.5 mg/dL 10.0  Total Protein 6.0 - 8.3 g/dL 6.9  Total Bilirubin 0.3 - 1.2 mg/dL 1.6(H)  Alkaline Phos 39 - 117 U/L 44  AST 0 - 37 U/L 19  ALT 0 - 53 U/L 20     Radiologic Studies:  12/18 Abd [place]: Fatty appearing liver and gallstones  Assessment: Encounter Diagnoses  Name Primary?  . Abdominal bloating Yes  . Early satiety     Vague  symptoms, somewhat difficult to characterize.  No red flag symptoms to  suggest malignancy, no nausea or vomiting or weight loss to suggest obstruction.  Possible functional dyspepsia/gastric dysrhythmia or low-grade gastroparesis. I agree that his symptoms do not sound typical for biliary colic. Plan:  EGD, he is agreeable  The benefits and risks of the planned procedure were described in detail with the patient or (when appropriate) their health care proxy.  Risks were outlined as including, but not limited to, bleeding, infection, perforation, adverse medication reaction leading to cardiac or pulmonary decompensation, or pancreatitis (if ERCP).  The limitation of incomplete mucosal visualization was also discussed.  No guarantees or warranties were given.   Thank you for the courtesy of this consult.  Please call me with any questions or concerns.  [name]  CC: Rankins, Bill Salinas, MD  Excell Seltzer, MD

## 2017-10-31 DIAGNOSIS — E291 Testicular hypofunction: Secondary | ICD-10-CM | POA: Diagnosis not present

## 2017-11-01 DIAGNOSIS — I10 Essential (primary) hypertension: Secondary | ICD-10-CM | POA: Diagnosis not present

## 2017-11-01 DIAGNOSIS — Z0000 Encounter for general adult medical examination without abnormal findings: Secondary | ICD-10-CM | POA: Diagnosis not present

## 2017-11-01 DIAGNOSIS — R911 Solitary pulmonary nodule: Secondary | ICD-10-CM | POA: Diagnosis not present

## 2017-11-01 DIAGNOSIS — N400 Enlarged prostate without lower urinary tract symptoms: Secondary | ICD-10-CM | POA: Diagnosis not present

## 2017-11-01 DIAGNOSIS — M109 Gout, unspecified: Secondary | ICD-10-CM | POA: Diagnosis not present

## 2017-11-05 ENCOUNTER — Other Ambulatory Visit: Payer: Self-pay | Admitting: Family Medicine

## 2017-11-05 DIAGNOSIS — R911 Solitary pulmonary nodule: Secondary | ICD-10-CM

## 2017-11-08 ENCOUNTER — Encounter: Payer: Self-pay | Admitting: Gastroenterology

## 2017-11-09 ENCOUNTER — Ambulatory Visit (AMBULATORY_SURGERY_CENTER): Payer: PPO | Admitting: Gastroenterology

## 2017-11-09 ENCOUNTER — Encounter: Payer: Self-pay | Admitting: Gastroenterology

## 2017-11-09 ENCOUNTER — Other Ambulatory Visit: Payer: Self-pay

## 2017-11-09 VITALS — BP 100/70 | HR 57 | Temp 97.8°F | Resp 9 | Ht 68.0 in | Wt 237.0 lb

## 2017-11-09 DIAGNOSIS — K2970 Gastritis, unspecified, without bleeding: Secondary | ICD-10-CM

## 2017-11-09 DIAGNOSIS — K3189 Other diseases of stomach and duodenum: Secondary | ICD-10-CM | POA: Diagnosis not present

## 2017-11-09 DIAGNOSIS — R140 Abdominal distension (gaseous): Secondary | ICD-10-CM | POA: Diagnosis not present

## 2017-11-09 MED ORDER — SODIUM CHLORIDE 0.9 % IV SOLN: 500.0000 mL | Freq: Once | INTRAVENOUS | Status: DC

## 2017-11-09 NOTE — Progress Notes (Signed)
Pt's states no medical or surgical changes since previsit or office visit. 

## 2017-11-09 NOTE — Progress Notes (Signed)
Report given to PACU, vss 

## 2017-11-09 NOTE — Op Note (Signed)
[facility] Patient Name: Ryan Scott Procedure Date: 11/09/2017 9:33 AM MRN: [id number] Endoscopist: Mallie Mussel L. Loletha Carrow , MD Age: 67 Referring MD:  Date of Birth: [birth year] Gender: Male Account #: [id number] Procedure:                Upper GI endoscopy Indications:              Abdominal bloating, Early satiety Medicines:                Monitored Anesthesia Care Procedure:                Pre-Anesthesia Assessment:                           - Prior to the procedure, a History and Physical                            was performed, and patient medications and                            allergies were reviewed. The patient's tolerance of                            previous anesthesia was also reviewed. The risks                            and benefits of the procedure and the sedation                            options and risks were discussed with the patient.                            All questions were answered, and informed consent                            was obtained. Anticoagulants: The patient has taken                            aspirin. It was decided not to withhold this                            medication prior to the procedure. ASA Grade                            Assessment: II - A patient with mild systemic                            disease. After reviewing the risks and benefits,                            the patient was deemed in satisfactory condition to                            undergo the procedure.  After obtaining informed consent, the endoscope was                            passed under direct vision. Throughout the                            procedure, the patient's blood pressure, pulse, and                            oxygen saturations were monitored continuously. The                            Endoscope was introduced through the mouth, and                            advanced to the second part of duodenum. The  upper                            GI endoscopy was accomplished without difficulty.                            The patient tolerated the procedure well. Scope In: Scope Out: Findings:                 The larynx was normal.                           The esophagus was normal.                           Diffuse inflammation characterized by adherent                            blood, congestion (edema) and erosions was found in                            the gastric body and in the gastric antrum.                            Biopsies were taken with a cold forceps for                            histology. (Sidney protocol).                           The cardia and gastric fundus were normal on                            retroflexion.                           A few erosions with adjacent inflammation were                            found in the duodenal bulb. Complications:  No immediate complications. Estimated Blood Loss:     Estimated blood loss was minimal. Impression:               - Normal larynx.                           - Normal esophagus.                           - Gastritis. Biopsied.                           - Erosive duodenopathy. Recommendation:           - Patient has a contact number available for                            emergencies. The signs and symptoms of potential                            delayed complications were discussed with the                            patient. Return to normal activities tomorrow.                            Written discharge instructions were provided to the                            patient.                           - Resume previous diet.                           - Discontinue aspirin and NSAIDs (meloxicam) today.                           - Await pathology results.                           - Use over-the-counter Prilosec (omeprazole) 20 mg                            PO daily for 28 days. Henry L. Loletha Carrow, MD 11/09/2017  9:58:01 AM This report has been signed electronically.

## 2017-11-09 NOTE — Patient Instructions (Signed)
Handout given for gastritis.  NO ASPIRIN OR MELOXICAM TODAY  GET OVER THE COUNTER PRILOSEC (omeprazole) 20 mg and take daily for 28 days.  YOU HAD AN ENDOSCOPIC PROCEDURE TODAY AT [facility]:   Refer to the procedure report that was given to you for any specific questions about what was found during the examination.  If the procedure report does not answer your questions, please call your gastroenterologist to clarify.  If you requested that your care partner not be given the details of your procedure findings, then the procedure report has been included in a sealed envelope for you to review at your convenience later.  YOU SHOULD EXPECT: Some feelings of bloating in the abdomen. Passage of more gas than usual.  Walking can help get rid of the air that was put into your GI tract during the procedure and reduce the bloating. If you had a lower endoscopy (such as a colonoscopy or flexible sigmoidoscopy) you may notice spotting of blood in your stool or on the toilet paper. If you underwent a bowel prep for your procedure, you may not have a normal bowel movement for a few days.  Please Note:  You might notice some irritation and congestion in your nose or some drainage.  This is from the oxygen used during your procedure.  There is no need for concern and it should clear up in a day or so.  SYMPTOMS TO REPORT IMMEDIATELY:    Following upper endoscopy (EGD)  Vomiting of blood or coffee ground material  New chest pain or pain under the shoulder blades  Painful or persistently difficult swallowing  New shortness of breath  Fever of 100F or higher  Black, tarry-looking stools  For urgent or emergent issues, a gastroenterologist can be reached at any hour by calling [phone].   DIET:  We do recommend a small meal at first, but then you may proceed to your regular diet.  Drink plenty of fluids but you should avoid alcoholic beverages for 24 hours.  ACTIVITY:  You should  plan to take it easy for the rest of today and you should NOT DRIVE or use heavy machinery until tomorrow (because of the sedation medicines used during the test).    FOLLOW UP: Our staff will call the number listed on your records the next business day following your procedure to check on you and address any questions or concerns that you may have regarding the information given to you following your procedure. If we do not reach you, we will leave a message.  However, if you are feeling well and you are not experiencing any problems, there is no need to return our call.  We will assume that you have returned to your regular daily activities without incident.  If any biopsies were taken you will be contacted by phone or by letter within the next 1-3 weeks.  Please call us at [phone] if you have not heard about the biopsies in 3 weeks.    SIGNATURES/CONFIDENTIALITY: You and/or your care partner have signed paperwork which will be entered into your electronic medical record.  These signatures attest to the fact that that the information above on your After Visit Summary has been reviewed and is understood.  Full responsibility of the confidentiality of this discharge information lies with you and/or your care-partner.

## 2017-11-09 NOTE — Progress Notes (Signed)
Called to room to assist during endoscopic procedure.  Patient ID and intended procedure confirmed with present staff. Received instructions for my participation in the procedure from the performing physician.  

## 2017-11-10 ENCOUNTER — Telehealth: Payer: Self-pay | Admitting: *Deleted

## 2017-11-10 DIAGNOSIS — M5431 Sciatica, right side: Secondary | ICD-10-CM | POA: Diagnosis not present

## 2017-11-10 NOTE — Telephone Encounter (Signed)
  Follow up Call-  Call back number 11/09/2017  Post procedure Call Back phone  # [phone]  Permission to leave phone message Yes  Some recent data might be hidden     Patient questions:  Do you have a fever, pain , or abdominal swelling? No. Pain Score  0 *  Have you tolerated food without any problems? Yes.    Have you been able to return to your normal activities? Yes.    Do you have any questions about your discharge instructions: Diet   No. Medications  No. Follow up visit  No.  Do you have questions or concerns about your Care? No.  Actions: * If pain score is 4 or above: No action needed, pain <4.

## 2017-11-17 ENCOUNTER — Ambulatory Visit
Admission: RE | Admit: 2017-11-17 | Discharge: 2017-11-17 | Disposition: A | Discharge status: Home / Self Care | Payer: PPO | Source: Ambulatory Visit | Attending: Family Medicine | Admitting: Family Medicine

## 2017-11-17 DIAGNOSIS — R918 Other nonspecific abnormal finding of lung field: Secondary | ICD-10-CM | POA: Diagnosis not present

## 2017-11-17 DIAGNOSIS — R911 Solitary pulmonary nodule: Secondary | ICD-10-CM

## 2017-11-28 DIAGNOSIS — E291 Testicular hypofunction: Secondary | ICD-10-CM | POA: Diagnosis not present

## 2017-11-29 ENCOUNTER — Telehealth: Payer: Self-pay | Admitting: Adult Health

## 2017-11-29 ENCOUNTER — Telehealth: Payer: Self-pay

## 2017-11-29 NOTE — Telephone Encounter (Signed)
Received CT Chest report from [facility] 11/29/17. Appt 12/18/17 NV

## 2017-11-29 NOTE — Telephone Encounter (Signed)
FAXED NOTES TO [facility] 

## 2017-12-13 NOTE — H&P (View-Only) (Signed)
Cardiology Office Note   Date:  12/18/2017   ID:  Ryan Scott, DOB 06/29/1951, MRN [id number]  PCP:  Rankins, Victoria R, MD  Cardiologist: Dr. Kelly   Chief Complaint  Patient presents with  . New Patient (Initial Visit)    Abnormal CT scan     History of Present Illness: Ryan Scott is a 66 y.o. male who presents for establishment in our practice. Formerly seen by Dr. Harding in 2014.  He comes today at the request of his primary care physician, Dr. Victoria Rankins. who completed a CT scan of his chest in the setting of worsening dyspnea flu and pneumonia.  CT scan of the chest revealed coronary calcifications.  He has been retired for 2 years, is normally very active, working in his yard, going fishing, doing chainsaw walking a good bit.  Up until December he was walking anywhere from 2-3 miles a day for exercise,.  He stopped doing so after he had the flu, and since that time has been more short of breath and is not able to get his energy back.  The patient also had a period of gastritis, it was thought that he had gallbladder disease but this was ruled out.  He has since found that he has a pinched nerve in his back.  He has noticed that his stamina and breathing status is worsened over the last year, he denies frank chest pain, dizziness, or diaphoresis.   As a result of abnormal CT scan and his symptoms, it was felt that the best for him to follow-up with cardiology for further recommendations.  Past Medical History:  Diagnosis Date  . Borderline hypertension   . BPH (benign prostatic hyperplasia)   . ED (erectile dysfunction)   . History of adenomatous polyp of colon    tubular adenoma  . History of bladder cancer    hx TURBT 1989  TCC of bladder  . History of kidney stones   . Hypogonadism male   . Left ureteral stone   . Lower urinary tract symptoms (LUTS)   . Nephrolithiasis    bilateral  . RBBB (right bundle branch block)     Past Surgical History:    Procedure Laterality Date  . CARDIOVASCULAR STRESS TEST  10-16-2012   normal myocardial perfusion study,  no ischemia/  not gated  . COLONOSCOPY  last one 07-26-2013  . PROSTATE SURGERY  2002  . SHOULDER SURGERY Left 2013  . TONSILLECTOMY    . TRANSTHORACIC ECHOCARDIOGRAM  10-16-2012   normal LVF, ef 55-65%,  mild AV sclerosis without stenosis,  mild MR and TR,  mild LAE,  trivial PR  . TRANSURETHRAL RESECTION OF BLADDER TUMOR WITH GYRUS (TURBT-GYRUS)  1989   and URETEROSCOPIC STONE EXTRACTION  . TRANSURETHRAL RESECTION OF PROSTATE    . UMBILICAL HERNIA REPAIR    . VENTRAL HERNIA REPAIR       Current Outpatient Medications  Medication Sig Dispense Refill  . allopurinol (ZYLOPRIM) 100 MG tablet Take 100 mg by mouth daily.    . aspirin 81 MG tablet Take 81 mg by mouth daily.    . meloxicam (MOBIC) 15 MG tablet Take 15 mg by mouth as needed for pain (for bursitis).    . testosterone cypionate (DEPOTESTOTERONE CYPIONATE) 200 MG/ML injection Inject 200 mg into the muscle every 28 (twenty-eight) days. Every other week    . valsartan-hydrochlorothiazide (DIOVAN-HCT) 160-12.5 MG tablet Take 1 tablet by mouth daily.    . ezetimibe (ZETIA) 10   MG tablet Take 1 tablet (10 mg total) by mouth daily. 30 tablet 3  . Pitavastatin Calcium (LIVALO) 1 MG TABS Take 10 tablets (10 mg total) by mouth daily. 30 tablet 3   Current Facility-Administered Medications  Medication Dose Route Frequency Provider Last Rate Last Dose  . 0.9 %  sodium chloride infusion  500 mL Intravenous Once Doran Stabler, MD        Allergies:   Patient has no known allergies.    Social History:  The patient  reports that he has never smoked. He quit smokeless tobacco use about 23 years ago. His smokeless tobacco use included chew. He reports that he drinks alcohol. He reports that he does not use drugs.   Family History:  The patient's family history includes Aneurysm in his paternal grandfather; Arthritis in his father  and sister; Breast cancer in his mother; Heart attack in his father; Heart disease in his father; Hypertension in his father; Lung cancer in his maternal grandfather; Other in his paternal grandmother; Stroke in his maternal grandmother; Thyroid disease in his mother.    ROS: All other systems are reviewed and negative. Unless otherwise mentioned in H&P    PHYSICAL EXAM: VS:  BP 118/78 (BP Location: Right Arm)   Pulse 65   Ht _0  (1.727 m)   Wt 242 lb 9.6 oz (110 kg)   BMI 36.89 kg/m  , BMI Body mass index is 36.89 kg/m. GEN: Well nourished, well developed, in no acute distress obese HEENT: normal  Neck: no JVD, carotid bruits, or masses Cardiac: RRR; S3 versus split S2 at the left sternal border fourth intercostal space  No rubs, or gallops,no edema  Respiratory:  Clear to auscultation bilaterally, normal work of breathing GI: soft, nontender, nondistended, + BS MS: no deformity or atrophy  Skin: warm and dry, no rash Neuro:  Strength and sensation are intact Psych: euthymic mood, full affect   EKG: Normal sinus rhythm with incomplete right bundle branch block.  Frequent PVC's. Unchanged since 2014.   Recent Labs: No results found for requested labs within last 8760 hours.    Lipid Panel No results found for: CHOL, TRIG, HDL, CHOLHDL, VLDL, LDLCALC, LDLDIRECT    Wt Readings from Last 3 Encounters:  12/18/17 242 lb 9.6 oz (110 kg)  11/09/17 237 lb (107.5 kg)  10/30/17 237 lb (107.5 kg)      Other studies Reviewed: Stress Test 1/21/2014Impression Exercise Capacity:  Excellent exercise capacity. BP Response:  Hypertensive blood pressure response. Clinical Symptoms:  There is dyspnea. ECG Impression:  No significant ST segment change suggestive of ischemia. There are scattered PVCs. The PVCs were more frequent during initial stages of exercise and recovery, decreasing in frequency with increased exercise. LV Wall Motion:  Not evaluated - non-gated due to  PVCs.  Echocardiogram 10/16/2012 Left ventricle: The cavity size was normal. Wall thickness was normal. Systolic function was normal. The estimated ejection fraction was in the range of 55% to 65%. Wall motion was normal; there were no regional wall motion abnormalities. - Mitral valve: Mild regurgitation. Valve area by pressure half-time: 2.1cm^2. - Left atrium: The atrium was mildly dilated. - Atrial septum: No defect or patent foramen ovale was identified. Impressions:  Sinus rhythm with occas. PVC's.  CT Chest:  IMPRESSION: 1. Two right middle lobe pulmonary nodules are unchanged since 2016 and benign. No additional imaging follow-up is necessary. 2. No acute pulmonary findings. Mild bilateral middle lobe and lower lobe scarring/atelectasis. 3.  Extensive coronary artery calcified atherosclerosis and/or stents.  Cholesterol Study 11/01/2017 Cholesterol 238; cholesterol/HDL 4.1; HDL D 58 Triglycerides 132; LDL C 154;  ASSESSMENT AND PLAN:  1.  Abnormal CT scan: This revealed a history of coronary artery calcification found incidentally for review of lung the setting of worsening shortness of breath.  Patient is noted also that he is having less energy, and not been able to walk any long distances as he has in the past without becoming more fatigued and short of breath.  I have reviewed this with [name], (DOD at [facility] today).  He has reviewed the CT scan, medications, recent testing, and EKG.  [name] agrees with my recommendation for cardiac catheterization.  He is met with and spoken to the patient in person as well.  I have discussed with the patient cardiac catheterization procedure, risks and benefits.  He wishes to forego stress testing and move forward with catheterization after explanation.  The patient understands that risks include but are not limited to stroke (1 in 1000), death (1 in 34), kidney failure [usually temporary] (1 in 500), bleeding (1 in 200),  allergic reaction [possibly serious] (1 in 200), and agrees to proceed.   Cardiac  Is scheduled for 12/19/2017, first case-with [name]  2.  Hypercholesterolemia: Statins in the past.  He is tried Crestor at 10 mg and atorvastatin at 40 mg.  It is caused significant pain:, myalgia and joint pain.  Her on Livalo 1 mg daily, and Zetia 10 mg daily for secondary prevention.  3.  Hypertension: Patient remains on valsartan HCTZ 160/12.5 mg daily.  Blood pressures well controlled.  4.  Chronic bursitis pain: The patient takes meloxicam 15 mg daily as needed for bursitis pain.  We have asked him to hold that dose prior to catheterization.  Current medicines are reviewed at length with the patient today.    Labs/ tests ordered today include: Left cardiac catheterization with coronary angiography and possible PCI.  Phill Myron. West Pugh, ANP, AACC   12/18/2017 11:57 AM    [facility] [address], [place], [place] [address] Phone: [phone]; Fax: [phone]

## 2017-12-13 NOTE — Progress Notes (Signed)
Cardiology Office Note   Date:  12/18/2017   ID:  Ryan Scott, Ryan Scott 06-23-1951, MRN [id number]  PCP:  [name], MD  Cardiologist: [name]   Chief Complaint  Patient presents with  . New Patient (Initial Visit)    Abnormal CT scan     History of Present Illness: Ryan Scott is a 67 y.o. male who presents for establishment in our practice. Formerly seen by Dr. Ellyn Hack in 2014.  He comes today at the request of his primary care physician, Dr. [name]. who completed a CT scan of his chest in the setting of worsening dyspnea flu and pneumonia.  CT scan of the chest revealed coronary calcifications.  He has been retired for 2 years, is normally very active, working in his yard, going fishing, doing chainsaw walking a good bit.  Up until December he was walking anywhere from 2-3 miles a day for exercise,.  He stopped doing so after he had the flu, and since that time has been more short of breath and is not able to get his energy back.  The patient also had a period of gastritis, it was thought that he had gallbladder disease but this was ruled out.  He has since found that he has a pinched nerve in his back.  He has noticed that his stamina and breathing status is worsened over the last year, he denies frank chest pain, dizziness, or diaphoresis.   As a result of abnormal CT scan and his symptoms, it was felt that the best for him to follow-up with cardiology for further recommendations.  Past Medical History:  Diagnosis Date  . Borderline hypertension   . BPH (benign prostatic hyperplasia)   . ED (erectile dysfunction)   . History of adenomatous polyp of colon    tubular adenoma  . History of bladder cancer    hx TURBT 1989  TCC of bladder  . History of kidney stones   . Hypogonadism male   . Left ureteral stone   . Lower urinary tract symptoms (LUTS)   . Nephrolithiasis    bilateral  . RBBB (right bundle branch block)     Past Surgical History:    Procedure Laterality Date  . CARDIOVASCULAR STRESS TEST  10-16-2012   normal myocardial perfusion study,  no ischemia/  not gated  . COLONOSCOPY  last one 07-26-2013  . PROSTATE SURGERY  2002  . SHOULDER SURGERY Left 2013  . TONSILLECTOMY    . TRANSTHORACIC ECHOCARDIOGRAM  10-16-2012   normal LVF, ef 55-65%,  mild AV sclerosis without stenosis,  mild MR and TR,  mild LAE,  trivial PR  . TRANSURETHRAL RESECTION OF BLADDER TUMOR WITH GYRUS (TURBT-GYRUS)  1989   and URETEROSCOPIC STONE EXTRACTION  . TRANSURETHRAL RESECTION OF PROSTATE    . UMBILICAL HERNIA REPAIR    . VENTRAL HERNIA REPAIR       Current Outpatient Medications  Medication Sig Dispense Refill  . allopurinol (ZYLOPRIM) 100 MG tablet Take 100 mg by mouth daily.    Marland Kitchen aspirin 81 MG tablet Take 81 mg by mouth daily.    . meloxicam (MOBIC) 15 MG tablet Take 15 mg by mouth as needed for pain (for bursitis).    Marland Kitchen testosterone cypionate (DEPOTESTOTERONE CYPIONATE) 200 MG/ML injection Inject 200 mg into the muscle every 28 (twenty-eight) days. Every other week    . valsartan-hydrochlorothiazide (DIOVAN-HCT) 160-12.5 MG tablet Take 1 tablet by mouth daily.    Marland Kitchen ezetimibe (ZETIA) 10  MG tablet Take 1 tablet (10 mg total) by mouth daily. 30 tablet 3  . Pitavastatin Calcium (LIVALO) 1 MG TABS Take 10 tablets (10 mg total) by mouth daily. 30 tablet 3   Current Facility-Administered Medications  Medication Dose Route Frequency Provider Last Rate Last Dose  . 0.9 %  sodium chloride infusion  500 mL Intravenous Once Doran Stabler, MD        Allergies:   Patient has no known allergies.    Social History:  The patient  reports that he has never smoked. He quit smokeless tobacco use about 23 years ago. His smokeless tobacco use included chew. He reports that he drinks alcohol. He reports that he does not use drugs.   Family History:  The patient's family history includes Aneurysm in his paternal grandfather; Arthritis in his father  and sister; Breast cancer in his mother; Heart attack in his father; Heart disease in his father; Hypertension in his father; Lung cancer in his maternal grandfather; Other in his paternal grandmother; Stroke in his maternal grandmother; Thyroid disease in his mother.    ROS: All other systems are reviewed and negative. Unless otherwise mentioned in H&P    PHYSICAL EXAM: VS:  BP 118/78 (BP Location: Right Arm)   Pulse 65   Ht _0  (1.727 m)   Wt 242 lb 9.6 oz (110 kg)   BMI 36.89 kg/m  , BMI Body mass index is 36.89 kg/m. GEN: Well nourished, well developed, in no acute distress obese HEENT: normal  Neck: no JVD, carotid bruits, or masses Cardiac: RRR; S3 versus split S2 at the left sternal border fourth intercostal space  No rubs, or gallops,no edema  Respiratory:  Clear to auscultation bilaterally, normal work of breathing GI: soft, nontender, nondistended, + BS MS: no deformity or atrophy  Skin: warm and dry, no rash Neuro:  Strength and sensation are intact Psych: euthymic mood, full affect   EKG: Normal sinus rhythm with incomplete right bundle branch block.  Frequent PVC's. Unchanged since 2014.   Recent Labs: No results found for requested labs within last 8760 hours.    Lipid Panel No results found for: CHOL, TRIG, HDL, CHOLHDL, VLDL, LDLCALC, LDLDIRECT    Wt Readings from Last 3 Encounters:  12/18/17 242 lb 9.6 oz (110 kg)  11/09/17 237 lb (107.5 kg)  10/30/17 237 lb (107.5 kg)      Other studies Reviewed: Stress Test 1/21/2014Impression Exercise Capacity:  Excellent exercise capacity. BP Response:  Hypertensive blood pressure response. Clinical Symptoms:  There is dyspnea. ECG Impression:  No significant ST segment change suggestive of ischemia. There are scattered PVCs. The PVCs were more frequent during initial stages of exercise and recovery, decreasing in frequency with increased exercise. LV Wall Motion:  Not evaluated - non-gated due to  PVCs.  Echocardiogram 10/16/2012 Left ventricle: The cavity size was normal. Wall thickness was normal. Systolic function was normal. The estimated ejection fraction was in the range of 55% to 65%. Wall motion was normal; there were no regional wall motion abnormalities. - Mitral valve: Mild regurgitation. Valve area by pressure half-time: 2.1cm^2. - Left atrium: The atrium was mildly dilated. - Atrial septum: No defect or patent foramen ovale was identified. Impressions:  Sinus rhythm with occas. PVC's.  CT Chest:  IMPRESSION: 1. Two right middle lobe pulmonary nodules are unchanged since 2016 and benign. No additional imaging follow-up is necessary. 2. No acute pulmonary findings. Mild bilateral middle lobe and lower lobe scarring/atelectasis. 3.  Extensive coronary artery calcified atherosclerosis and/or stents.  Cholesterol Study 11/01/2017 Cholesterol 238; cholesterol/HDL 4.1; HDL D 58 Triglycerides 132; LDL C 154;  ASSESSMENT AND PLAN:  1.  Abnormal CT scan: This revealed a history of coronary artery calcification found incidentally for review of lung the setting of worsening shortness of breath.  Patient is noted also that he is having less energy, and not been able to walk any long distances as he has in the past without becoming more fatigued and short of breath.  I have reviewed this with [name], (DOD at [facility] today).  He has reviewed the CT scan, medications, recent testing, and EKG.  [name] agrees with my recommendation for cardiac catheterization.  He is met with and spoken to the patient in person as well.  I have discussed with the patient cardiac catheterization procedure, risks and benefits.  He wishes to forego stress testing and move forward with catheterization after explanation.  The patient understands that risks include but are not limited to stroke (1 in 1000), death (1 in 34), kidney failure [usually temporary] (1 in 500), bleeding (1 in 200),  allergic reaction [possibly serious] (1 in 200), and agrees to proceed.   Cardiac  Is scheduled for 12/19/2017, first case-with [name]  2.  Hypercholesterolemia: Statins in the past.  He is tried Crestor at 10 mg and atorvastatin at 40 mg.  It is caused significant pain:, myalgia and joint pain.  Her on Livalo 1 mg daily, and Zetia 10 mg daily for secondary prevention.  3.  Hypertension: Patient remains on valsartan HCTZ 160/12.5 mg daily.  Blood pressures well controlled.  4.  Chronic bursitis pain: The patient takes meloxicam 15 mg daily as needed for bursitis pain.  We have asked him to hold that dose prior to catheterization.  Current medicines are reviewed at length with the patient today.    Labs/ tests ordered today include: Left cardiac catheterization with coronary angiography and possible PCI.  Phill Myron. West Pugh, ANP, AACC   12/18/2017 11:57 AM    [facility] [address], [place], [place] [address] Phone: [phone]; Fax: [phone]

## 2017-12-18 ENCOUNTER — Encounter: Payer: Self-pay | Admitting: Adult Health

## 2017-12-18 ENCOUNTER — Ambulatory Visit (INDEPENDENT_AMBULATORY_CARE_PROVIDER_SITE_OTHER): Payer: PPO | Admitting: Adult Health

## 2017-12-18 VITALS — BP 118/78 | HR 65 | Ht 68.0 in | Wt 242.6 lb

## 2017-12-18 DIAGNOSIS — I10 Essential (primary) hypertension: Secondary | ICD-10-CM | POA: Diagnosis not present

## 2017-12-18 DIAGNOSIS — I2510 Atherosclerotic heart disease of native coronary artery without angina pectoris: Secondary | ICD-10-CM | POA: Diagnosis not present

## 2017-12-18 DIAGNOSIS — E7800 Pure hypercholesterolemia, unspecified: Secondary | ICD-10-CM

## 2017-12-18 DIAGNOSIS — R931 Abnormal findings on diagnostic imaging of heart and coronary circulation: Secondary | ICD-10-CM | POA: Diagnosis not present

## 2017-12-18 MED ORDER — EZETIMIBE 10 MG PO TABS: 10.0000 mg | ORAL_TABLET | Freq: Every day | ORAL | 3 refills | Status: DC

## 2017-12-18 MED ORDER — PITAVASTATIN CALCIUM 1 MG PO TABS: 10.0000 mg | ORAL_TABLET | Freq: Every day | ORAL | 3 refills | Status: DC

## 2017-12-18 NOTE — Patient Instructions (Signed)
Medication Instructions:  START LIVALO 1MG  DAILY  START ZETIA 10MG  DAILY  If you need a refill on your cardiac medications before your next appointment, please call your pharmacy.  Labwork: BMET, CBC AND PT/INR TODAY HERE IN OUR OFFICE AT [facility]  Testing/Procedures: Your physician has requested that you have a cardiac catheterization. Cardiac catheterization is used to diagnose and/or treat various heart conditions. Doctors may recommend this procedure for a number of different reasons. The most common reason is to evaluate chest pain. Chest pain can be a symptom of coronary artery disease (CAD), and cardiac catheterization can show whether plaque is narrowing or blocking your heart's arteries. This procedure is also used to evaluate the valves, as well as measure the blood flow and oxygen levels in different parts of your heart. For further information please visit [email or web address]. Please follow instruction sheet, as given. DO NOT TAKE MELOXICAM THE DAY OF THE CATH.  Follow-Up: Your physician wants you to follow-up in: AFTER CATH   Thank you for choosing [facility] at [facility]!!      [place] [address] [place] [address] Dept: [phone] Loc: [phone]  Ryan Scott  12/18/2017  You are scheduled for a Cardiac Catheterization on Tuesday, March 26 with Dr. Shelva Majestic.  1. Please arrive at the [facility] (Main Entrance A) at [facility]: [address] [place], [place] [address] at 5:30 AM (two hours before your procedure to ensure your preparation). Free valet parking service is available.   Special note: Every effort is made to have your procedure done on time. Please understand that emergencies sometimes delay scheduled procedures.  2. Diet: Do not eat or drink anything after midnight prior to your procedure except sips of water to take  medications.  3. Labs: You will need to have blood drawn on Monday, March 25 at [place], [place]  Open: [place] (Lunch 12:30 - 1:30)   Phone: [phone]. You do not need to be fasting.  4. Medication instructions in preparation for your procedure: MAKE SURE TO HOLD MELOXICAM THE DAY OF THE PROCEDURE     On the morning of your procedure, take your Aspirin 81MG  and any morning medicines NOT listed above.  You may use sips of water.  5. Plan for one night stay--bring personal belongings. 6. Bring a current list of your medications and current insurance cards. 7. You MUST have a responsible person to drive you home. 8. Someone MUST be with you the first 24 hours after you arrive home or your discharge will be delayed. 9. Please wear clothes that are easy to get on and off and wear slip-on shoes.  Thank you for allowing [place] to care for you!   -- [facility]

## 2017-12-18 NOTE — Addendum Note (Signed)
Addended by: Lendon Colonel on: 12/18/2017 12:37 PM   Modules accepted: Level of Service

## 2017-12-18 NOTE — H&P (Signed)
Date:  12/18/2017   ID:  Ryan Scott, DOB 04/30/1951, MRN [id number]  PCP:  [name], MD  Cardiologist: [name]   Chief Complaint  Patient presents with  . New Patient (Initial Visit)    Abnormal CT scan     History of Present Illness: Ryan Scott is a 67 y.o. male who presents for establishment in our practice. Formerly seen by Dr. Ellyn Hack in 2014.  He comes today at the request of his primary care physician, Dr. [name]. who completed a CT scan of his chest in the setting of worsening dyspnea flu and pneumonia.  CT scan of the chest revealed coronary calcifications.  He has been retired for 2 years, is normally very active, working in his yard, going fishing, doing chainsaw walking a good bit.  Up until December he was walking anywhere from 2-3 miles a day for exercise,.  He stopped doing so after he had the flu, and since that time has been more short of breath and is not able to get his energy back.  The patient also had a period of gastritis, it was thought that he had gallbladder disease but this was ruled out.  He has since found that he has a pinched nerve in his back.  He has noticed that his stamina and breathing status is worsened over the last year, he denies frank chest pain, dizziness, or diaphoresis.   As a result of abnormal CT scan and his symptoms, it was felt that the best for him to follow-up with cardiology for further recommendations.  Past Medical History:  Diagnosis Date  . Borderline hypertension   . BPH (benign prostatic hyperplasia)   . ED (erectile dysfunction)   . History of adenomatous polyp of colon    tubular adenoma  . History of bladder cancer    hx TURBT 1989  TCC of bladder  . History of kidney stones   . Hypogonadism male   . Left ureteral stone   . Lower urinary tract symptoms (LUTS)   . Nephrolithiasis    bilateral  . RBBB (right bundle branch block)     Past Surgical History:  Procedure Laterality Date  .  CARDIOVASCULAR STRESS TEST  10-16-2012   normal myocardial perfusion study,  no ischemia/  not gated  . COLONOSCOPY  last one 07-26-2013  . PROSTATE SURGERY  2002  . SHOULDER SURGERY Left 2013  . TONSILLECTOMY    . TRANSTHORACIC ECHOCARDIOGRAM  10-16-2012   normal LVF, ef 55-65%,  mild AV sclerosis without stenosis,  mild MR and TR,  mild LAE,  trivial PR  . TRANSURETHRAL RESECTION OF BLADDER TUMOR WITH GYRUS (TURBT-GYRUS)  1989   and URETEROSCOPIC STONE EXTRACTION  . TRANSURETHRAL RESECTION OF PROSTATE    . UMBILICAL HERNIA REPAIR    . VENTRAL HERNIA REPAIR       Current Outpatient Medications  Medication Sig Dispense Refill  . allopurinol (ZYLOPRIM) 100 MG tablet Take 100 mg by mouth daily.    Marland Kitchen aspirin 81 MG tablet Take 81 mg by mouth daily.    . meloxicam (MOBIC) 15 MG tablet Take 15 mg by mouth as needed for pain (for bursitis).    Marland Kitchen testosterone cypionate (DEPOTESTOTERONE CYPIONATE) 200 MG/ML injection Inject 200 mg into the muscle every 28 (twenty-eight) days. Every other week    . valsartan-hydrochlorothiazide (DIOVAN-HCT) 160-12.5 MG tablet Take 1 tablet by mouth daily.    Marland Kitchen ezetimibe (ZETIA) 10 MG tablet Take 1 tablet (10  mg total) by mouth daily. 30 tablet 3  . Pitavastatin Calcium (LIVALO) 1 MG TABS Take 10 tablets (10 mg total) by mouth daily. 30 tablet 3   Current Facility-Administered Medications  Medication Dose Route Frequency Provider Last Rate Last Dose  . 0.9 %  sodium chloride infusion  500 mL Intravenous Once Doran Stabler, MD        Allergies:   Patient has no known allergies.    Social History:  The patient  reports that he has never smoked. He quit smokeless tobacco use about 23 years ago. His smokeless tobacco use included chew. He reports that he drinks alcohol. He reports that he does not use drugs.   Family History:  The patient's family history includes Aneurysm in his paternal grandfather; Arthritis in his father and sister; Breast cancer in his  mother; Heart attack in his father; Heart disease in his father; Hypertension in his father; Lung cancer in his maternal grandfather; Other in his paternal grandmother; Stroke in his maternal grandmother; Thyroid disease in his mother.    ROS: All other systems are reviewed and negative. Unless otherwise mentioned in H&P    PHYSICAL EXAM: VS:  BP 118/78 (BP Location: Right Arm)   Pulse 65   Ht 5' 8"  (1.727 m)   Wt 242 lb 9.6 oz (110 kg)   BMI 36.89 kg/m  , BMI Body mass index is 36.89 kg/m. GEN: Well nourished, well developed, in no acute distress obese HEENT: normal  Neck: no JVD, carotid bruits, or masses Cardiac: RRR; S3 versus split S2 at the left sternal border fourth intercostal space  No rubs, or gallops,no edema  Respiratory:  Clear to auscultation bilaterally, normal work of breathing GI: soft, nontender, nondistended, + BS MS: no deformity or atrophy  Skin: warm and dry, no rash Neuro:  Strength and sensation are intact Psych: euthymic mood, full affect   EKG: Normal sinus rhythm with incomplete right bundle branch block.  Recent Labs: No results found for requested labs within last 8760 hours.    Lipid Panel No results found for: CHOL, TRIG, HDL, CHOLHDL, VLDL, LDLCALC, LDLDIRECT    Wt Readings from Last 3 Encounters:  12/18/17 242 lb 9.6 oz (110 kg)  11/09/17 237 lb (107.5 kg)  10/30/17 237 lb (107.5 kg)      Other studies Reviewed: Stress Test 1/21/2014Impression Exercise Capacity:  Excellent exercise capacity. BP Response:  Hypertensive blood pressure response. Clinical Symptoms:  There is dyspnea. ECG Impression:  No significant ST segment change suggestive of ischemia. There are scattered PVCs. The PVCs were more frequent during initial stages of exercise and recovery, decreasing in frequency with increased exercise. LV Wall Motion:  Not evaluated - non-gated due to PVCs.  Echocardiogram 10/16/2012 Left ventricle: The cavity size was normal. Wall  thickness was normal. Systolic function was normal. The estimated ejection fraction was in the range of 55% to 65%. Wall motion was normal; there were no regional wall motion abnormalities. - Mitral valve: Mild regurgitation. Valve area by pressure half-time: 2.1cm^2. - Left atrium: The atrium was mildly dilated. - Atrial septum: No defect or patent foramen ovale was identified. Impressions:  Sinus rhythm with occas. PVC's.  CT Chest:  IMPRESSION: 1. Two right middle lobe pulmonary nodules are unchanged since 2016 and benign. No additional imaging follow-up is necessary. 2. No acute pulmonary findings. Mild bilateral middle lobe and lower lobe scarring/atelectasis. 3. Extensive coronary artery calcified atherosclerosis and/or stents.  Cholesterol Study 11/01/2017 Cholesterol 238;  cholesterol/HDL 4.1; HDL D 58 Triglycerides 132; LDL C 154;  ASSESSMENT AND PLAN:  1.  Abnormal CT scan: This revealed a history of coronary artery calcification found incidentally for review of lung the setting of worsening shortness of breath.  Patient is noted also that he is having less energy, and not been able to walk any long distances as he has in the past without becoming more fatigued and short of breath.  I have reviewed this with [name], (DOD at [facility] today).  He has reviewed the CT scan, medications, recent testing, and EKG.  [name] agrees with my recommendation for cardiac catheterization.  He is met with and spoken to the patient in person as well.  I have discussed with the patient cardiac catheterization procedure, risks and benefits.  He wishes to forego stress testing and move forward with catheterization after explanation.  The patient understands that risks include but are not limited to stroke (1 in 1000), death (1 in 14), kidney failure [usually temporary] (1 in 500), bleeding (1 in 200), allergic reaction [possibly serious] (1 in 200), and agrees to proceed.   Cardiac   Is scheduled for 12/19/2017, first case-with [name]  2.  Hypercholesterolemia: Statins in the past.  He is tried Crestor at 10 mg and atorvastatin at 40 mg.  It is caused significant pain:, myalgia and joint pain.  Her on Livalo 1 mg daily, and Zetia 10 mg daily for secondary prevention.  3.  Hypertension: Patient remains on valsartan HCTZ 160/12.5 mg daily.  Blood pressures well controlled.  4.  Chronic bursitis pain: The patient takes meloxicam 15 mg daily as needed for bursitis pain.  We have asked him to hold that dose prior to catheterization.  Current medicines are reviewed at length with the patient today.    Labs/ tests ordered today include: Left cardiac catheterization with coronary angiography and possible PCI.  Phill Myron. West Pugh, ANP, AACC   12/18/2017 11:57 AM    [facility] [address], [place], [place] [address] Phone: [phone]; Fax: [phone]

## 2017-12-19 ENCOUNTER — Encounter (HOSPITAL_COMMUNITY): Payer: Self-pay | Admitting: Cardiovascular Disease

## 2017-12-19 ENCOUNTER — Ambulatory Visit (HOSPITAL_COMMUNITY)
Admission: RE | Admit: 2017-12-19 | Discharge: 2017-12-20 | Disposition: A | Discharge status: Home / Self Care | Payer: PPO | Source: Ambulatory Visit | Attending: Cardiovascular Disease | Admitting: Cardiovascular Disease

## 2017-12-19 ENCOUNTER — Ambulatory Visit (HOSPITAL_COMMUNITY)
Admission: RE | Disposition: A | Discharge status: Home / Self Care | Payer: Self-pay | Source: Ambulatory Visit | Attending: Cardiovascular Disease

## 2017-12-19 ENCOUNTER — Other Ambulatory Visit: Payer: Self-pay

## 2017-12-19 DIAGNOSIS — E785 Hyperlipidemia, unspecified: Secondary | ICD-10-CM | POA: Diagnosis not present

## 2017-12-19 DIAGNOSIS — I25118 Atherosclerotic heart disease of native coronary artery with other forms of angina pectoris: Secondary | ICD-10-CM

## 2017-12-19 DIAGNOSIS — N183 Chronic kidney disease, stage 3 unspecified: Secondary | ICD-10-CM

## 2017-12-19 DIAGNOSIS — Z8551 Personal history of malignant neoplasm of bladder: Secondary | ICD-10-CM | POA: Diagnosis not present

## 2017-12-19 DIAGNOSIS — Z7982 Long term (current) use of aspirin: Secondary | ICD-10-CM | POA: Insufficient documentation

## 2017-12-19 DIAGNOSIS — N400 Enlarged prostate without lower urinary tract symptoms: Secondary | ICD-10-CM | POA: Diagnosis not present

## 2017-12-19 DIAGNOSIS — Z79899 Other long term (current) drug therapy: Secondary | ICD-10-CM | POA: Insufficient documentation

## 2017-12-19 DIAGNOSIS — R931 Abnormal findings on diagnostic imaging of heart and coronary circulation: Secondary | ICD-10-CM | POA: Diagnosis present

## 2017-12-19 DIAGNOSIS — R140 Abdominal distension (gaseous): Secondary | ICD-10-CM

## 2017-12-19 DIAGNOSIS — F17290 Nicotine dependence, other tobacco product, uncomplicated: Secondary | ICD-10-CM | POA: Insufficient documentation

## 2017-12-19 DIAGNOSIS — Z789 Other specified health status: Secondary | ICD-10-CM

## 2017-12-19 DIAGNOSIS — I129 Hypertensive chronic kidney disease with stage 1 through stage 4 chronic kidney disease, or unspecified chronic kidney disease: Secondary | ICD-10-CM | POA: Diagnosis not present

## 2017-12-19 DIAGNOSIS — Z9861 Coronary angioplasty status: Secondary | ICD-10-CM

## 2017-12-19 DIAGNOSIS — E7800 Pure hypercholesterolemia, unspecified: Secondary | ICD-10-CM | POA: Insufficient documentation

## 2017-12-19 DIAGNOSIS — I2584 Coronary atherosclerosis due to calcified coronary lesion: Secondary | ICD-10-CM | POA: Diagnosis not present

## 2017-12-19 DIAGNOSIS — I2510 Atherosclerotic heart disease of native coronary artery without angina pectoris: Secondary | ICD-10-CM

## 2017-12-19 DIAGNOSIS — I25119 Atherosclerotic heart disease of native coronary artery with unspecified angina pectoris: Secondary | ICD-10-CM | POA: Diagnosis present

## 2017-12-19 DIAGNOSIS — Z955 Presence of coronary angioplasty implant and graft: Secondary | ICD-10-CM

## 2017-12-19 HISTORY — DX: Low back pain: M54.5

## 2017-12-19 HISTORY — DX: Pure hypercholesterolemia, unspecified: E78.00

## 2017-12-19 HISTORY — DX: Low back pain, unspecified: M54.50

## 2017-12-19 HISTORY — PX: CORONARY STENT INTERVENTION: CATH118234

## 2017-12-19 HISTORY — DX: Other chronic pain: G89.29

## 2017-12-19 HISTORY — PX: LEFT HEART CATH AND CORONARY ANGIOGRAPHY: CATH118249

## 2017-12-19 HISTORY — DX: Malignant neoplasm of bladder, unspecified: C67.9

## 2017-12-19 HISTORY — DX: Unspecified osteoarthritis, unspecified site: M19.90

## 2017-12-19 LAB — BASIC METABOLIC PANEL
BUN/Creatinine Ratio: 9 — ABNORMAL LOW (ref 10–24)
BUN: 12 mg/dL (ref 8–27)
CHLORIDE: 102 mmol/L (ref 96–106)
CO2: 24 mmol/L (ref 20–29)
Calcium: 9.4 mg/dL (ref 8.6–10.2)
Creatinine, Ser: 1.29 mg/dL — ABNORMAL HIGH (ref 0.76–1.27)
GFR calc Af Amer: 66 mL/min/{1.73_m2} (ref >59)
GFR calc non Af Amer: 57 mL/min/{1.73_m2} — ABNORMAL LOW (ref >59)
Glucose: 81 mg/dL (ref 65–99)
POTASSIUM: 4.4 mmol/L (ref 3.5–5.2)
Sodium: 139 mmol/L (ref 134–144)

## 2017-12-19 LAB — POCT ACTIVATED CLOTTING TIME: ACTIVATED CLOTTING TIME: 483 s

## 2017-12-19 LAB — CBC
Hematocrit: 49.8 % (ref 37.5–51.0)
Hemoglobin: 17 g/dL (ref 13.0–17.7)
MCH: 30.4 pg (ref 26.6–33.0)
MCHC: 34.1 g/dL (ref 31.5–35.7)
MCV: 89 fL (ref 79–97)
PLATELETS: 236 10*3/uL (ref 150–379)
RBC: 5.6 x10E6/uL (ref 4.14–5.80)
RDW: 14.2 % (ref 12.3–15.4)
WBC: 6.9 10*3/uL (ref 3.4–10.8)

## 2017-12-19 LAB — PROTIME-INR
INR: 1 (ref 0.8–1.2)
PROTHROMBIN TIME: 10.5 s (ref 9.1–12.0)

## 2017-12-19 SURGERY — LEFT HEART CATH AND CORONARY ANGIOGRAPHY
Anesthesia: LOCAL

## 2017-12-19 MED ORDER — MIDAZOLAM HCL 2 MG/2ML IJ SOLN
INTRAMUSCULAR | Status: AC
  Filled 2017-12-19: qty 2

## 2017-12-19 MED ORDER — ALLOPURINOL 100 MG PO TABS
100.0000 mg | ORAL_TABLET | Freq: Every day | ORAL | Status: DC
  Administered 2017-12-20: 100 mg via ORAL
  Filled 2017-12-19 (×2): qty 1

## 2017-12-19 MED ORDER — SODIUM CHLORIDE 0.9 % IV SOLN: INTRAVENOUS | Status: DC

## 2017-12-19 MED ORDER — NITROGLYCERIN 1 MG/10 ML FOR IR/CATH LAB
INTRA_ARTERIAL | Status: AC
  Filled 2017-12-19: qty 10

## 2017-12-19 MED ORDER — BIVALIRUDIN TRIFLUOROACETATE 250 MG IV SOLR
INTRAVENOUS | Status: AC
  Filled 2017-12-19: qty 250

## 2017-12-19 MED ORDER — SODIUM CHLORIDE 0.9 % IV SOLN: 500.0000 mL | Freq: Once | INTRAVENOUS | Status: DC

## 2017-12-19 MED ORDER — ONDANSETRON HCL 4 MG/2ML IJ SOLN: 4.0000 mg | Freq: Four times a day (QID) | INTRAMUSCULAR | Status: DC | PRN

## 2017-12-19 MED ORDER — SODIUM CHLORIDE 0.9 % IV SOLN
INTRAVENOUS | Status: AC | PRN
  Administered 2017-12-19 (×2): 1.75 mg/kg/h via INTRAVENOUS

## 2017-12-19 MED ORDER — IOPAMIDOL (ISOVUE-370) INJECTION 76%
INTRAVENOUS | Status: DC | PRN
  Administered 2017-12-19: 230 mL via INTRAVENOUS

## 2017-12-19 MED ORDER — IOPAMIDOL (ISOVUE-370) INJECTION 76%
INTRAVENOUS | Status: AC
  Filled 2017-12-19: qty 100

## 2017-12-19 MED ORDER — SODIUM CHLORIDE 0.9 % IV SOLN: 250.0000 mL | INTRAVENOUS | Status: DC | PRN

## 2017-12-19 MED ORDER — LIDOCAINE HCL 1 % IJ SOLN
INTRAMUSCULAR | Status: AC
  Filled 2017-12-19: qty 20

## 2017-12-19 MED ORDER — ACETAMINOPHEN 325 MG PO TABS
650.0000 mg | ORAL_TABLET | ORAL | Status: DC | PRN
  Administered 2017-12-19: 650 mg via ORAL

## 2017-12-19 MED ORDER — FENTANYL CITRATE (PF) 100 MCG/2ML IJ SOLN
INTRAMUSCULAR | Status: DC | PRN
  Administered 2017-12-19 (×2): 25 ug via INTRAVENOUS

## 2017-12-19 MED ORDER — METOPROLOL TARTRATE 12.5 MG HALF TABLET
12.5000 mg | ORAL_TABLET | Freq: Two times a day (BID) | ORAL | Status: DC
  Administered 2017-12-19 – 2017-12-20 (×2): 12.5 mg via ORAL
  Filled 2017-12-19 (×3): qty 1

## 2017-12-19 MED ORDER — HEPARIN SODIUM (PORCINE) 1000 UNIT/ML IJ SOLN
INTRAMUSCULAR | Status: AC
  Filled 2017-12-19: qty 1

## 2017-12-19 MED ORDER — LIDOCAINE HCL (PF) 1 % IJ SOLN
INTRAMUSCULAR | Status: DC | PRN
  Administered 2017-12-19: 2 mL

## 2017-12-19 MED ORDER — HYDRALAZINE HCL 20 MG/ML IJ SOLN: 5.0000 mg | INTRAMUSCULAR | Status: AC | PRN

## 2017-12-19 MED ORDER — ASPIRIN 81 MG PO TABS: 81.0000 mg | ORAL_TABLET | Freq: Every day | ORAL | Status: DC

## 2017-12-19 MED ORDER — BIVALIRUDIN BOLUS VIA INFUSION - CUPID
INTRAVENOUS | Status: DC | PRN
  Administered 2017-12-19: 79.95 mg via INTRAVENOUS

## 2017-12-19 MED ORDER — SODIUM CHLORIDE 0.9 % IV SOLN
INTRAVENOUS | Status: DC
  Administered 2017-12-19: 07:00:00 via INTRAVENOUS

## 2017-12-19 MED ORDER — HEPARIN (PORCINE) IN NACL 2-0.9 UNIT/ML-% IJ SOLN
INTRAMUSCULAR | Status: AC | PRN
  Administered 2017-12-19 (×2): 500 mL

## 2017-12-19 MED ORDER — HEPARIN SODIUM (PORCINE) 1000 UNIT/ML IJ SOLN
INTRAMUSCULAR | Status: DC | PRN
  Administered 2017-12-19: 5000 [IU] via INTRAVENOUS

## 2017-12-19 MED ORDER — ASPIRIN 81 MG PO CHEW: 81.0000 mg | CHEWABLE_TABLET | ORAL | Status: DC

## 2017-12-19 MED ORDER — HEPARIN (PORCINE) IN NACL 2-0.9 UNIT/ML-% IJ SOLN
INTRAMUSCULAR | Status: AC
  Filled 2017-12-19: qty 1000

## 2017-12-19 MED ORDER — MIDAZOLAM HCL 2 MG/2ML IJ SOLN
INTRAMUSCULAR | Status: DC | PRN
  Administered 2017-12-19: 2 mg via INTRAVENOUS
  Administered 2017-12-19: 1 mg via INTRAVENOUS

## 2017-12-19 MED ORDER — VERAPAMIL HCL 2.5 MG/ML IV SOLN
INTRAVENOUS | Status: AC
  Filled 2017-12-19: qty 2

## 2017-12-19 MED ORDER — LABETALOL HCL 5 MG/ML IV SOLN: 10.0000 mg | INTRAVENOUS | Status: AC | PRN

## 2017-12-19 MED ORDER — ISOSORBIDE MONONITRATE ER 30 MG PO TB24
30.0000 mg | ORAL_TABLET | Freq: Every day | ORAL | Status: DC
  Administered 2017-12-19 – 2017-12-20 (×3): 30 mg via ORAL
  Filled 2017-12-19 (×3): qty 1

## 2017-12-19 MED ORDER — DIAZEPAM 5 MG PO TABS: 5.0000 mg | ORAL_TABLET | Freq: Four times a day (QID) | ORAL | Status: DC | PRN

## 2017-12-19 MED ORDER — NITROGLYCERIN 1 MG/10 ML FOR IR/CATH LAB
INTRA_ARTERIAL | Status: DC | PRN
  Administered 2017-12-19: 200 ug via INTRACORONARY
  Administered 2017-12-19: 100 ug via INTRACORONARY

## 2017-12-19 MED ORDER — SODIUM CHLORIDE 0.9% FLUSH: 3.0000 mL | Freq: Two times a day (BID) | INTRAVENOUS | Status: DC

## 2017-12-19 MED ORDER — SODIUM CHLORIDE 0.9% FLUSH: 3.0000 mL | INTRAVENOUS | Status: DC | PRN

## 2017-12-19 MED ORDER — SODIUM CHLORIDE 0.9% FLUSH
3.0000 mL | Freq: Two times a day (BID) | INTRAVENOUS | Status: DC
  Administered 2017-12-19: 3 mL via INTRAVENOUS

## 2017-12-19 MED ORDER — TICAGRELOR 90 MG PO TABS
90.0000 mg | ORAL_TABLET | Freq: Two times a day (BID) | ORAL | Status: DC
  Administered 2017-12-19 – 2017-12-20 (×2): 90 mg via ORAL
  Filled 2017-12-19 (×2): qty 1

## 2017-12-19 MED ORDER — EZETIMIBE 10 MG PO TABS
10.0000 mg | ORAL_TABLET | Freq: Every day | ORAL | Status: DC
  Administered 2017-12-19 – 2017-12-20 (×2): 10 mg via ORAL
  Filled 2017-12-19 (×2): qty 1

## 2017-12-19 MED ORDER — SODIUM CHLORIDE 0.9 % IV SOLN: INTRAVENOUS | Status: AC

## 2017-12-19 MED ORDER — TICAGRELOR 90 MG PO TABS
ORAL_TABLET | ORAL | Status: DC | PRN
  Administered 2017-12-19: 180 mg via ORAL

## 2017-12-19 MED ORDER — FENTANYL CITRATE (PF) 100 MCG/2ML IJ SOLN
INTRAMUSCULAR | Status: AC
  Filled 2017-12-19: qty 2

## 2017-12-19 MED ORDER — ACETAMINOPHEN 325 MG PO TABS
ORAL_TABLET | ORAL | Status: AC
  Filled 2017-12-19: qty 2

## 2017-12-19 MED ORDER — ASPIRIN 81 MG PO CHEW
81.0000 mg | CHEWABLE_TABLET | Freq: Every day | ORAL | Status: DC
  Administered 2017-12-20: 10:00:00 81 mg via ORAL
  Filled 2017-12-19: qty 1

## 2017-12-19 MED ORDER — SODIUM CHLORIDE 0.9 % IV SOLN
INTRAVENOUS | Status: AC
  Administered 2017-12-19: 16:00:00 via INTRAVENOUS

## 2017-12-19 MED ORDER — MELOXICAM 15 MG PO TABS
15.0000 mg | ORAL_TABLET | Freq: Every day | ORAL | Status: DC | PRN
  Filled 2017-12-19: qty 1

## 2017-12-19 SURGICAL SUPPLY — 24 items
BALLN SAPPHIRE 2.0X10 (BALLOONS) ×2
BALLN SAPPHIRE 2.5X15 (BALLOONS) ×2
BALLN SAPPHIRE ~~LOC~~ 3.25X10 (BALLOONS) ×2 IMPLANT
BALLN WOLVERINE 2.00X10 (BALLOONS) ×2
BALLOON SAPPHIRE 2.0X10 (BALLOONS) ×1 IMPLANT
BALLOON SAPPHIRE 2.5X15 (BALLOONS) ×1 IMPLANT
BALLOON WOLVERINE 2.00X10 (BALLOONS) ×1 IMPLANT
BAND ZEPHYR COMPRESS 30 LONG (HEMOSTASIS) ×2 IMPLANT
CATH INFINITI 5 FR JL3.5 (CATHETERS) ×2 IMPLANT
CATH INFINITI 5FR ANG PIGTAIL (CATHETERS) ×2 IMPLANT
CATH INFINITI JR4 5F (CATHETERS) ×2 IMPLANT
CATH OPTITORQUE TIG 4.0 5F (CATHETERS) ×2 IMPLANT
CATH VISTA GUIDE 6FR XBLAD3.5 (CATHETERS) ×2 IMPLANT
GUIDEWIRE INQWIRE 1.5J.035X260 (WIRE) ×1 IMPLANT
INQWIRE 1.5J .035X260CM (WIRE) ×2
KIT HEART LEFT (KITS) ×2 IMPLANT
NEEDLE PERC ENTRY 21G 2.5CM (NEEDLE) ×2 IMPLANT
PACK CARDIAC CATHETERIZATION (CUSTOM PROCEDURE TRAY) ×2 IMPLANT
SHEATH RAIN RADIAL 21G 6FR (SHEATH) ×2 IMPLANT
STENT SYNERGY DES 3X16 (Permanent Stent) ×2 IMPLANT
SYR MEDRAD MARK V 150ML (SYRINGE) ×2 IMPLANT
TRANSDUCER W/STOPCOCK (MISCELLANEOUS) ×2 IMPLANT
TUBING CIL FLEX 10 FLL-RA (TUBING) ×2 IMPLANT
WIRE PT2 MS 185 (WIRE) ×2 IMPLANT

## 2017-12-19 NOTE — Progress Notes (Signed)
[facility] BAND REMOVAL  LOCATION:    right radial  DEFLATED PER PROTOCOL:    Yes.    TIME BAND OFF / DRESSING APPLIED:    1430   SITE UPON ARRIVAL:    Level 1( small bruise distal to incission site)  SITE AFTER BAND REMOVAL:    Level 1 ( small bruise distal to incission site)  CIRCULATION SENSATION AND MOVEMENT:    Within Normal Limits   Yes.    COMMENTS:   Tolerated procedure well

## 2017-12-19 NOTE — Plan of Care (Signed)
  Problem: Education: Goal: Knowledge of General Education information will improve Outcome: Progressing   Problem: Education: Goal: Understanding of CV disease, CV risk reduction, and recovery process will improve Outcome: Progressing   Problem: Activity: Goal: Ability to return to baseline activity level will improve Outcome: Progressing   Problem: Cardiovascular: Goal: Ability to achieve and maintain adequate cardiovascular perfusion will improve Outcome: Progressing Goal: Vascular access site(s) Level 0-1 will be maintained Outcome: Progressing   Problem: Health Behavior/Discharge Planning: Goal: Ability to safely manage health-related needs after discharge will improve Outcome: Progressing

## 2017-12-19 NOTE — Interval H&P Note (Signed)
Cath Lab Visit (complete for each Cath Lab visit)  Clinical Evaluation Leading to the Procedure:   ACS: No.  Non-ACS:    Anginal Classification: CCS III  Anti-ischemic medical therapy: No Therapy  Non-Invasive Test Results: No non-invasive testing performed  Prior CABG: No previous CABG      History and Physical Interval Note:  12/19/2017 7:28 AM  Ryan Scott  has presented today for surgery, with the diagnosis of abnormal ct  The various methods of treatment have been discussed with the patient and family. After consideration of risks, benefits and other options for treatment, the patient has consented to  Procedure(s): LEFT HEART CATH AND CORONARY ANGIOGRAPHY (N/A) as a surgical intervention .  The patient's history has been reviewed, patient examined, no change in status, stable for surgery.  I have reviewed the patient's chart and labs.  Questions were answered to the patient's satisfaction.     Shelva Majestic

## 2017-12-19 NOTE — Care Management Note (Addendum)
Case Management Note  Patient Details  Name: Ryan Scott MRN: [id number] Date of Birth: [birth year]  Subjective/Objective:            Patient provided with Brilinta coupon, spoke w Maudie Mercury at [employer] who states they have it stock.   Jacqualin Combes, RN        # 5.  S/W TRISTAS @ [facility] # [phone] OPT- 2    BRILINTA 90 MG BID  COVER- YES  CO-PAY- $ 45.00  TIER- 3 DRUG  PRIOR APPROVAL- NO   PREFERRED PHARMACY : YES- [facility]            Action/Plan:   Expected Discharge Date:                  Expected Discharge Plan:     In-House Referral:     Discharge planning Services  CM Consult, Medication Assistance  Post Acute Care Choice:    Choice offered to:     DME Arranged:    DME Agency:     HH Arranged:    HH Agency:     Status of Service:  In process, will continue to follow  If discussed at Long Length of Stay Meetings, dates discussed:    Additional Comments:  Carles Collet, RN 12/19/2017, 3:08 PM

## 2017-12-20 ENCOUNTER — Telehealth: Payer: Self-pay | Admitting: Cardiovascular Disease

## 2017-12-20 DIAGNOSIS — N183 Chronic kidney disease, stage 3 unspecified: Secondary | ICD-10-CM

## 2017-12-20 DIAGNOSIS — E785 Hyperlipidemia, unspecified: Secondary | ICD-10-CM | POA: Diagnosis not present

## 2017-12-20 DIAGNOSIS — E7800 Pure hypercholesterolemia, unspecified: Secondary | ICD-10-CM | POA: Diagnosis not present

## 2017-12-20 DIAGNOSIS — I2584 Coronary atherosclerosis due to calcified coronary lesion: Secondary | ICD-10-CM | POA: Diagnosis not present

## 2017-12-20 DIAGNOSIS — N400 Enlarged prostate without lower urinary tract symptoms: Secondary | ICD-10-CM | POA: Diagnosis not present

## 2017-12-20 DIAGNOSIS — F17290 Nicotine dependence, other tobacco product, uncomplicated: Secondary | ICD-10-CM | POA: Diagnosis not present

## 2017-12-20 DIAGNOSIS — I25119 Atherosclerotic heart disease of native coronary artery with unspecified angina pectoris: Secondary | ICD-10-CM

## 2017-12-20 DIAGNOSIS — Z8551 Personal history of malignant neoplasm of bladder: Secondary | ICD-10-CM | POA: Diagnosis not present

## 2017-12-20 DIAGNOSIS — Z79899 Other long term (current) drug therapy: Secondary | ICD-10-CM | POA: Diagnosis not present

## 2017-12-20 DIAGNOSIS — Z7982 Long term (current) use of aspirin: Secondary | ICD-10-CM | POA: Diagnosis not present

## 2017-12-20 DIAGNOSIS — I129 Hypertensive chronic kidney disease with stage 1 through stage 4 chronic kidney disease, or unspecified chronic kidney disease: Secondary | ICD-10-CM | POA: Diagnosis not present

## 2017-12-20 DIAGNOSIS — I25118 Atherosclerotic heart disease of native coronary artery with other forms of angina pectoris: Secondary | ICD-10-CM | POA: Diagnosis not present

## 2017-12-20 DIAGNOSIS — Z789 Other specified health status: Secondary | ICD-10-CM | POA: Diagnosis not present

## 2017-12-20 LAB — BASIC METABOLIC PANEL
ANION GAP: 8 (ref 5–15)
BUN: 15 mg/dL (ref 6–20)
CALCIUM: 8.3 mg/dL — AB (ref 8.9–10.3)
CO2: 25 mmol/L (ref 22–32)
Chloride: 103 mmol/L (ref 101–111)
Creatinine, Ser: 1.42 mg/dL — ABNORMAL HIGH (ref 0.61–1.24)
GFR calc non Af Amer: 50 mL/min — ABNORMAL LOW (ref >60)
GFR, EST AFRICAN AMERICAN: 58 mL/min — AB (ref >60)
Glucose, Bld: 100 mg/dL — ABNORMAL HIGH (ref 65–99)
Potassium: 3.7 mmol/L (ref 3.5–5.1)
Sodium: 136 mmol/L (ref 135–145)

## 2017-12-20 LAB — CBC
HCT: 44.7 % (ref 39.0–52.0)
HEMOGLOBIN: 14.8 g/dL (ref 13.0–17.0)
MCH: 29.8 pg (ref 26.0–34.0)
MCHC: 33.1 g/dL (ref 30.0–36.0)
MCV: 90.1 fL (ref 78.0–100.0)
Platelets: 190 10*3/uL (ref 150–400)
RBC: 4.96 MIL/uL (ref 4.22–5.81)
RDW: 13.5 % (ref 11.5–15.5)
WBC: 8.8 10*3/uL (ref 4.0–10.5)

## 2017-12-20 MED ORDER — HEPARIN SODIUM (PORCINE) 1000 UNIT/ML IJ SOLN
INTRAMUSCULAR | Status: AC
  Filled 2017-12-20: qty 1

## 2017-12-20 MED ORDER — ISOSORBIDE MONONITRATE ER 30 MG PO TB24: 30.0000 mg | ORAL_TABLET | Freq: Every day | ORAL | 3 refills | Status: DC

## 2017-12-20 MED ORDER — FENTANYL CITRATE (PF) 100 MCG/2ML IJ SOLN
INTRAMUSCULAR | Status: AC
  Filled 2017-12-20: qty 2

## 2017-12-20 MED ORDER — TICAGRELOR 90 MG PO TABS: 90.0000 mg | ORAL_TABLET | Freq: Two times a day (BID) | ORAL | 3 refills | Status: DC

## 2017-12-20 MED ORDER — METOPROLOL TARTRATE 25 MG PO TABS: 12.5000 mg | ORAL_TABLET | Freq: Two times a day (BID) | ORAL | 2 refills | Status: DC

## 2017-12-20 MED ORDER — HEPARIN (PORCINE) IN NACL 2-0.9 UNIT/ML-% IJ SOLN
INTRAMUSCULAR | Status: AC
  Filled 2017-12-20: qty 1000

## 2017-12-20 MED ORDER — ASPIRIN 81 MG PO CHEW: 81.0000 mg | CHEWABLE_TABLET | Freq: Every day | ORAL | Status: DC

## 2017-12-20 MED ORDER — TICAGRELOR 90 MG PO TABS: 90.0000 mg | ORAL_TABLET | Freq: Two times a day (BID) | ORAL | 0 refills | Status: DC

## 2017-12-20 MED ORDER — IOPAMIDOL (ISOVUE-370) INJECTION 76%
INTRAVENOUS | Status: AC
  Filled 2017-12-20: qty 125

## 2017-12-20 MED ORDER — LIDOCAINE HCL 1 % IJ SOLN
INTRAMUSCULAR | Status: AC
  Filled 2017-12-20: qty 20

## 2017-12-20 MED ORDER — MIDAZOLAM HCL 2 MG/2ML IJ SOLN
INTRAMUSCULAR | Status: AC
  Filled 2017-12-20: qty 2

## 2017-12-20 MED ORDER — VERAPAMIL HCL 2.5 MG/ML IV SOLN
INTRAVENOUS | Status: AC
  Filled 2017-12-20: qty 2

## 2017-12-20 MED ORDER — ANGIOPLASTY BOOK
Freq: Once | Status: AC
  Administered 2017-12-20: 07:00:00
  Filled 2017-12-20: qty 1

## 2017-12-20 MED ORDER — NITROGLYCERIN 1 MG/10 ML FOR IR/CATH LAB
INTRA_ARTERIAL | Status: AC
  Filled 2017-12-20: qty 10

## 2017-12-20 MED FILL — Heparin Sodium (Porcine) 2 Unit/ML in Sodium Chloride 0.9%: INTRAMUSCULAR | Qty: 1000 | Status: AC

## 2017-12-20 MED FILL — Verapamil HCl IV Soln 2.5 MG/ML: INTRAVENOUS | Qty: 4 | Status: AC

## 2017-12-20 MED FILL — Lidocaine HCl Local Inj 1%: INTRAMUSCULAR | Qty: 40 | Status: AC

## 2017-12-20 MED FILL — Heparin Sodium (Porcine) Inj 1000 Unit/ML: INTRAMUSCULAR | Qty: 10 | Status: AC

## 2017-12-20 NOTE — Discharge Summary (Addendum)
Discharge Summary    Patient ID: Ryan Scott,  MRN: [id number], DOB/AGE: 68-Nov-1952 68 y.o.  Admit date: 12/19/2017 Discharge date: 12/20/2017  Primary Care Provider: Milagros Scott R Primary Cardiologist: Ryan Majestic, MD  Discharge Diagnoses    Principal Problem:   Coronary artery disease involving native coronary artery of native heart with angina pectoris [facility]) Active Problems:   Abnormal CT scan, heart   CAD S/P percutaneous coronary angioplasty   Hyperlipidemia   CKD (chronic kidney disease) stage 3, GFR 30-59 ml/min (HCC)   Statin intolerance   Allergies No Known Allergies  Diagnostic Studies/Procedures    Cath 12/19/2017 Conclusion     Mid LAD lesion is 80% stenosed.  Ost LAD to Prox LAD lesion is 20% stenosed.  Prox LAD-1 lesion is 30% stenosed.  Prox LAD-2 lesion is 80% stenosed.  Ost 1st Mrg lesion is 60% stenosed.  Prox RCA to Mid RCA lesion is 50% stenosed.  Mid RCA to Dist RCA lesion is 70% stenosed.  Dist RCA-1 lesion is 80% stenosed.  Dist RCA-2 lesion is 95% stenosed.  The left ventricular ejection fraction is 50-55% by visual estimate.  LV end diastolic pressure is normal.  The left ventricular systolic function is normal.   Low normal global LV function with an EF of 50-55% without definitive focal segmental wall motion abnormalities.  Significant coronary calcification with vessel CAD.  The LAD had proximal 20-30% stenosis and a calcified segment and then following a sharp and had 80% stenosis after the first diagonal vessel and 80% distal stenosis;  60% focal OM1 stenosis prior to a bifurcation; calcified RCA with possible 50% stenosis on a proximal bend in the vessel.  There is diffuse 70-80-95% stenoses  beyond the acute margin extending up to the takeoff of the PDA.  There is collateralization of the PDA and PLA vessels via the distal circumflex.  Difficult but successful PCI to the calcified LAD with ultimate  insertion of a 3.016 mm Synergy DES stent to the proximal lesion with the 80% stenosis being reduced to 0%, and PTCA of the distal LAD lesion with the 80% stenosis being reduced to less than 5% utilizing bivalirudin and aspirin/Brilinta.  RECOMMENDATION: DAPT for a mininum of one year. Following this initial procedure he will be started on anti-ischemic medical therapy including beta blocker and nitrates.  Will review angiograms with colleagues.  The distal RCA is collateralized.  Consider attempt at initial medical therapy of the diffusely diseased distal calcified RCA versus staged  PCI.    _____________   History of Present Illness     Ryan Scott is a 67 y.o. male who presents for establishment in our practice. Formerly seen by Dr. Ellyn Scott in 2014.  He comes today at the request of his primary care physician, Dr. Milagros Scott. who completed a CT scan of his chest in the setting of worsening dyspnea flu and pneumonia.  CT scan of the chest revealed coronary calcifications.  He has been retired for 2 years, is normally very active, working in his yard, going fishing, doing chainsaw walking a good bit.  Up until December he was walking anywhere from 2-3 miles a day for exercise,.  He stopped doing so after he had the flu, and since that time has been more short of breath and is not able to get his energy back.  The patient also had a period of gastritis, it was thought that he had gallbladder disease but this was ruled out.  He has since found that he has a pinched nerve in his back.  He has noticed that his stamina and breathing status is worsened over the last year, he denies frank chest pain, dizziness, or diaphoresis.   As a result of abnormal CT scan and his symptoms, it was felt that the best for him to follow-up with cardiology for further recommendations.   Hospital Course     Patient underwent a scheduled cardiac catheterization on 12/19/2017 which showed 80% mid LAD lesion,  20% ostial to proximal LAD lesion, 80% proximal LAD, 70% mid RCA lesion, 95% distal RCA lesion, EF 50-55%.  He underwent successful PCI with 3.0 x 16 mm Synergy DES to proximal LAD, he also underwent PTCA of distal LAD lesion.  Postprocedure, patient was prior placed on aspirin and Brilinta.  He was also placed on 30 mg daily of Imdur given significant residual disease.  He was seen in the morning of 12/20/2817 by Dr. Marlou Scott, at which time he denies any chest pain or shortness of breath.  Her doctors again, we can potentially consider staged outpatient PCI to distal RCA in the future if patient fails medical therapy.  Also we may have to consider PCSK 9 inhibitor if her cholesterol cannot be controlled on the current Livalo and Zetia. I did hold her Diovan due to soft BP.   _____________  Discharge Vitals Blood pressure (!) 103/59, pulse 69, temperature 97.6 F (36.4 C), temperature source Oral, resp. rate (!) 23, height 5\' 10"  (1.778 m), weight 235 lb 14.3 oz (107 kg), SpO2 94 %.  Filed Weights   12/19/17 0549 12/20/17 0248  Weight: 235 lb (106.6 kg) 235 lb 14.3 oz (107 kg)    Labs & Radiologic Studies    CBC Recent Labs    12/18/17 1129 12/20/17 0232  WBC 6.9 8.8  HGB 17.0 14.8  HCT 49.8 44.7  MCV 89 90.1  PLT 236 025   Basic Metabolic Panel Recent Labs    12/18/17 1129 12/20/17 0232  NA 139 136  K 4.4 3.7  CL 102 103  CO2 24 25  GLUCOSE 81 100*  BUN 12 15  CREATININE 1.29* 1.42*  CALCIUM 9.4 8.3*   _____________  No results found. Disposition   Pt is being discharged home today in good condition.  Follow-up Plans & Appointments    Follow-up Information    Ryan Colonel, NP Follow up on 01/01/2018.   Specialties:  Nurse Practitioner, Radiology, Cardiology Why:  9:30AM. Followup with Dr. Evette Georges NP for hospital followup.  Contact information: [address] [place] [address] [phone]          Discharge Instructions    Amb  Referral to Cardiac Rehabilitation   Complete by:  As directed    Diagnosis:   Coronary Stents PTCA     Diet - low sodium heart healthy   Complete by:  As directed    Discharge instructions   Complete by:  As directed    No driving for 24 hours. No lifting over 5 lbs for 1 week. No sexual activity for 1 week. Keep procedure site clean & dry. If you notice increased pain, swelling, bleeding or pus, call/return!  You may shower, but no soaking baths/hot tubs/pools for 1 week.   Increase activity slowly   Complete by:  As directed       Discharge Medications   Allergies as of 12/20/2017   No Known Allergies     Medication List  STOP taking these medications   aspirin 81 MG tablet Replaced by:  aspirin 81 MG chewable tablet   valsartan-hydrochlorothiazide 160-12.5 MG tablet Commonly known as:  DIOVAN-HCT     TAKE these medications   allopurinol 100 MG tablet Commonly known as:  ZYLOPRIM Take 100 mg by mouth daily.   aspirin 81 MG chewable tablet Chew 1 tablet (81 mg total) by mouth daily. Start taking on:  12/21/2017 Replaces:  aspirin 81 MG tablet   ezetimibe 10 MG tablet Commonly known as:  ZETIA Take 1 tablet (10 mg total) by mouth daily.   isosorbide mononitrate 30 MG 24 hr tablet Commonly known as:  IMDUR Take 1 tablet (30 mg total) by mouth daily. Start taking on:  12/21/2017   meloxicam 15 MG tablet Commonly known as:  MOBIC Take 15 mg by mouth as needed for pain (for bursitis).   metoprolol tartrate 25 MG tablet Commonly known as:  LOPRESSOR Take 0.5 tablets (12.5 mg total) by mouth 2 (two) times daily.   Pitavastatin Calcium 1 MG Tabs Commonly known as:  LIVALO Take 10 tablets (10 mg total) by mouth daily. What changed:  how much to take   testosterone cypionate 200 MG/ML injection Commonly known as:  DEPOTESTOSTERONE CYPIONATE Inject 200 mg into the muscle every 28 (twenty-eight) days.   ticagrelor 90 MG Tabs tablet Commonly known as:   BRILINTA Take 1 tablet (90 mg total) by mouth 2 (two) times daily.        Aspirin prescribed at discharge?  Yes High Intensity Statin Prescribed? (Lipitor 40-80mg  or Crestor 20-40mg ): No, on Livalo Beta Blocker Prescribed? Yes For EF <40%, was ACEI/ARB Prescribed? No: EF normal ADP Receptor Inhibitor Prescribed? (i.e. Plavix etc.-Includes Medically Managed Patients): Yes For EF <40%, Aldosterone Inhibitor Prescribed? No: EF normal Was EF assessed during THIS hospitalization? Yes Was Cardiac Rehab II ordered? (Included Medically managed Patients): Yes   Outstanding Labs/Studies   None  Duration of Discharge Encounter   Greater than 30 minutes including physician time.  SignedAlmyra Deforest NP 12/20/2017, 10:43 AM  Personally seen and examined. Agree with above.   Feels well, no CP, no SOB.    GEN:No acute distress.   Neck:No JVD Cardiac:RRR, no murmurs, rubs, or gallops.  Respiratory:Clear to auscultation bilaterally. ZO:XWRU, nontender, non-distended  MS:No edema; No deformity. Cath site normal Neuro:Nonfocal  Psych: Normal affect     CAD with angina  - PCI to LAD and PTCA to distal LAD  - other diffuse dz as above  - DAPT one year, Bb low dose, brady at rest, normal EF 50-55%, LVEDP normal.   - considering staged PCI to distal RCA in future if medical therapy does not suffice.   - Imdur.   - Cardiac rehab  Hyperlipidemia  - statin intolerance with Crestor and atorvastatin.   - Zetia 10 and Livalo 1mg  PO QD (see [place] NP note)  - Consider PCSK9 inhibitor.  CKD 3  - minimal elevation in creatinine. Avoid NSAID.   [place] for DC.  Follow up in 1-2 weeks, APP.    Candee Furbish, MD

## 2017-12-20 NOTE — Progress Notes (Signed)
CARDIAC REHAB PHASE I   PRE:  Rate/Rhythm: 58 SR with PVCs    BP: sitting 104/75    SaO2:   MODE:  Ambulation: 1000 ft   POST:  Rate/Rhythm: 84 SR    BP: sitting 134/81     SaO2:   Pt able to walk 1000 ft however he did still have significant SOB starting at about 500- 600 ft.  Sts he feels this is partly due to recent gastritis and his stomach gets bloated after eating. Ed completed with good reception. Understands importance of Brilinta. Will refer to [place], ACSM 12/20/2017 8:58 AM

## 2017-12-20 NOTE — Telephone Encounter (Signed)
New Message  [facility] appointment made on 01/01/2018 at 9:30 with Jory Sims per Almyra Deforest

## 2017-12-20 NOTE — Progress Notes (Signed)
Progress Note  Patient Name: Ryan Scott Date of Encounter: 12/20/2017  Primary Cardiologist: No primary care provider on file. [name]  Subjective   Feels well, no CP, no SOB.   Inpatient Medications    Scheduled Meds: . allopurinol  100 mg Oral Daily  . aspirin  81 mg Oral Daily  . ezetimibe  10 mg Oral Daily  . isosorbide mononitrate  30 mg Oral Daily  . metoprolol tartrate  12.5 mg Oral BID  . sodium chloride flush  3 mL Intravenous Q12H  . ticagrelor  90 mg Oral BID   Continuous Infusions: . sodium chloride    . sodium chloride     PRN Meds: sodium chloride, acetaminophen, diazepam, meloxicam, ondansetron (ZOFRAN) IV, sodium chloride flush   Vital Signs    Vitals:   12/19/17 1952 12/19/17 2127 12/20/17 0248 12/20/17 0700  BP: 94/67  102/61 (!) 103/59  Pulse: 74 (!) 55 (!) 56 69  Resp: (!) 26  16 (!) 23  Temp: (!) 97.5 F (36.4 C)  (!) 97.4 F (36.3 C) 97.6 F (36.4 C)  TempSrc: Oral  Oral Oral  SpO2: 94%  93% 94%  Weight:   235 lb 14.3 oz (107 kg)   Height:        Intake/Output Summary (Last 24 hours) at 12/20/2017 0854 Last data filed at 12/20/2017 0700 Gross per 24 hour  Intake 1951.67 ml  Output -  Net 1951.67 ml   Filed Weights   12/19/17 0549 12/20/17 0248  Weight: 235 lb (106.6 kg) 235 lb 14.3 oz (107 kg)    Telemetry    NSR, no VT - Personally Reviewed  ECG    SB IRBBB, no ischemic changes.  - Personally Reviewed  Physical Exam   GEN: No acute distress.   Neck: No JVD Cardiac: RRR, no murmurs, rubs, or gallops.  Respiratory: Clear to auscultation bilaterally. GI: Soft, nontender, non-distended  MS: No edema; No deformity. Cath site normal Neuro:  Nonfocal  Psych: Normal affect   Labs    Chemistry Recent Labs  Lab 12/18/17 1129 12/20/17 0232  NA 139 136  K 4.4 3.7  CL 102 103  CO2 24 25  GLUCOSE 81 100*  BUN 12 15  CREATININE 1.29* 1.42*  CALCIUM 9.4 8.3*  GFRNONAA 57* 50*  GFRAA 66 58*  ANIONGAP  --  8       Hematology Recent Labs  Lab 12/18/17 1129 12/20/17 0232  WBC 6.9 8.8  RBC 5.60 4.96  HGB 17.0 14.8  HCT 49.8 44.7  MCV 89 90.1  MCH 30.4 29.8  MCHC 34.1 33.1  RDW 14.2 13.5  PLT 236 190    Cardiac EnzymesNo results for input(s): TROPONINI in the last 168 hours. No results for input(s): TROPIPOC in the last 168 hours.   BNPNo results for input(s): BNP, PROBNP in the last 168 hours.   DDimer No results for input(s): DDIMER in the last 168 hours.   Radiology    No results found.  Cardiac Studies   Cath 12/19/17:  Mid LAD lesion is 80% stenosed.  Ost LAD to Prox LAD lesion is 20% stenosed.  Prox LAD-1 lesion is 30% stenosed.  Prox LAD-2 lesion is 80% stenosed.  Ost 1st Mrg lesion is 60% stenosed.  Prox RCA to Mid RCA lesion is 50% stenosed.  Mid RCA to Dist RCA lesion is 70% stenosed.  Dist RCA-1 lesion is 80% stenosed.  Dist RCA-2 lesion is 95% stenosed.  The  left ventricular ejection fraction is 50-55% by visual estimate.  LV end diastolic pressure is normal.  The left ventricular systolic function is normal.   Low normal global LV function with an EF of 50-55% without definitive focal segmental wall motion abnormalities.  Significant coronary calcification with vessel CAD.  The LAD had proximal 20-30% stenosis and a calcified segment and then following a sharp and had 80% stenosis after the first diagonal vessel and 80% distal stenosis;  60% focal OM1 stenosis prior to a bifurcation; calcified RCA with possible 50% stenosis on a proximal bend in the vessel.  There is diffuse 70-80-95% stenoses  beyond the acute margin extending up to the takeoff of the PDA.  There is collateralization of the PDA and PLA vessels via the distal circumflex.  Difficult but successful PCI to the calcified LAD with ultimate insertion of a 3.016 mm Synergy DES stent to the proximal lesion with the 80% stenosis being reduced to 0%, and PTCA of the distal LAD lesion with the  80% stenosis being reduced to less than 5% utilizing bivalirudin and aspirin/Brilinta.  RECOMMENDATION: DAPT for a mininum of one year. Following this initial procedure he will be started on anti-ischemic medical therapy including beta blocker and nitrates.  Will review angiograms with colleagues.  The distal RCA is collateralized.  Consider attempt at initial medical therapy of the diffusely diseased distal calcified RCA versus staged  PCI.   Diagnostic Diagram           Patient Profile     67 y.o. male with CAD post PCI  Assessment & Plan    CAD with angina  - PCI to LAD and PTCA to distal LAD  - other diffuse dz as above  - DAPT one year, Bb low dose, brady at rest, normal EF 50-55%, LVEDP normal.   - considering staged PCI to distal RCA in future if medical therapy does not suffice.   - Imdur.   - Cardiac rehab  Hyperlipidemia  - statin intolerance with Crestor and atorvastatin.   - Zetia 10 and Livalo 1mg  PO QD (see [place] NP note)  - Consider PCSK9 inhibitor.  CKD 3  - minimal elevation in creatinine. Avoid NSAID.   [place] for DC.  Follow up in 1-2 weeks, APP.   For questions or updates, please contact [facility] [place] Please consult [email or web address] for contact info under Cardiology/STEMI.      Signed, Candee Furbish, MD  12/20/2017, 8:54 AM

## 2017-12-20 NOTE — Telephone Encounter (Signed)
**Note De-Identified Deissy Guilbert Obfuscation** The pt is following up in our [facility] office. Will forward to the NL Clinical pool.

## 2017-12-21 ENCOUNTER — Telehealth (HOSPITAL_COMMUNITY): Payer: Self-pay

## 2017-12-21 MED FILL — Lidocaine HCl Local Inj 1%: INTRAMUSCULAR | Qty: 20 | Status: AC

## 2017-12-21 NOTE — Telephone Encounter (Signed)
Patient contacted regarding discharge from [facility] on 12/20/17.  Patient understands to follow up with provider LA[facility] on 01/01/18 at 9:30 AMat [facility]. Patient understands discharge instructions? yes  Patient understands medications and regiment? yes  Patient understands to bring all medications to this visit? yes

## 2017-12-21 NOTE — Telephone Encounter (Signed)
Patients insurance is active and benefits verified through [place] - $15.00 co-pay, no deductible, out of pocket amount of $3,400/$264.40 has been met, no co-insurance, and no pre-authorization is required. Reference [phone]  Patient will be contacted for scheduling upon review by the RN Navigator.

## 2017-12-22 ENCOUNTER — Telehealth: Payer: Self-pay | Admitting: Cardiovascular Disease

## 2017-12-22 NOTE — Telephone Encounter (Signed)
New Message:    Pt c/o medication issue:  1. Name of Medication: Pitavastatin Calcium (LIVALO) 1 MG TABS, ezetimibe (ZETIA) 10 MG tablet  2. How are you currently taking this medication (dosage and times per day)? Take 1 tablet (10 mg total) by mouth daily. Take 10 tablets (10 mg total) by mouth daily. Patient taking differently: Take 1 mg by mouth daily.  3. Are you having a reaction (difficulty breathing--STAT)? No  4. What is your medication issue? Pt wants to know if he should be taking both of these medications at one time after having his stint put in

## 2017-12-22 NOTE — Telephone Encounter (Signed)
Patient is aware he should take BOTH pitavastatin & zetia to lower cholesterol

## 2017-12-25 ENCOUNTER — Other Ambulatory Visit: Payer: Self-pay | Admitting: Physician Assistant

## 2017-12-25 NOTE — Telephone Encounter (Signed)
Please review for refill, Thanks !  

## 2017-12-25 NOTE — Telephone Encounter (Signed)
REFILL 

## 2017-12-26 ENCOUNTER — Telehealth (HOSPITAL_COMMUNITY): Payer: Self-pay

## 2017-12-26 DIAGNOSIS — E291 Testicular hypofunction: Secondary | ICD-10-CM | POA: Diagnosis not present

## 2017-12-26 NOTE — Telephone Encounter (Signed)
Called and spoke with patient in regards to Cardiac Rehab - Patient stated he would like to think on it and call me back. Will contact patient if no phone call.

## 2017-12-28 NOTE — Progress Notes (Signed)
Cardiology Office Note   Date:  01/01/2018   ID:  Ryan, Scott December 05, 1950, MRN [id number]  PCP:  [name], MD  Cardiologist: [name] No chief complaint on file.    History of Present Illness: Ryan Scott is a 67 y.o. male who presents for posthospitalization follow-up, after admission for chest discomfort.  The patient has been followed by PCP Dr. [name] who completed a CT scan of his chest in the setting of worsening dyspnea and flu symptoms.  CT scan of the chest revealed coronary calcifications.  The patient also had noticed that his stamina had decreased along with some mild shortness of breath.  The patient ultimately underwent cardiac catheterization on 12/19/2017 that showed multivessel disease.  Most prominent was an 80% mid LAD lesion, proximal LAD lesion, and 70% mid RCA lesion with a 95% distal RCA lesion.  The patient required PCI with a 3.0 x 16 mm Synergy drug-eluting stent to the proximal LAD, also PTCA of the distal LAD lesion.  The RCA was collateralized and therefore no intervention was indicated.  However, a staged PCI to distal RCA in the future if he failed medical therapy.   Discharge he was continued on statin, and Zetia.  Diovan was held due to soft blood pressure.  He was continued on aspirin 81 and Brilinta 90 mg twice daily, isosorbide mononitrate 30 mg daily, metoprolol 25 mg daily.  He was recommended for cardiac rehab.  The patient had not made a decision yet to participate.  He comes today with some concerns and questions. He has had a gnawing H/A post discharge and some dyspnea. He has had no recurrent chest pain, bleeding or fatigue. He is walking about a mile a day but has not done any heavy lifting. He is changing his diet slowly and is watching cholesterol.   Past Medical History:  Diagnosis Date  . Arthritis    "back, neck, shoulders" (12/19/2017)  . Bladder cancer ([name])   . Borderline hypertension   . BPH (benign  prostatic hyperplasia)   . Chronic lower back pain   . ED (erectile dysfunction)   . High cholesterol    "started BP RX 12/18/2017"  . History of adenomatous polyp of colon    tubular adenoma  . History of kidney stones   . Hypertension   . Hypogonadism male   . Left ureteral stone   . Lower urinary tract symptoms (LUTS)   . RBBB (right bundle branch block)     Past Surgical History:  Procedure Laterality Date  . CARDIOVASCULAR STRESS TEST  10-16-2012   normal myocardial perfusion study,  no ischemia/  not gated  . COLONOSCOPY  last one 07-26-2013  . CORONARY STENT INTERVENTION N/A 12/19/2017   Procedure: CORONARY STENT INTERVENTION;  Surgeon: [name], MD;  Location: [facility];  Service: Cardiovascular;  Laterality: N/A;  . CYSTOSCOPY/RETROGRADE/URETEROSCOPY/STONE EXTRACTION WITH BASKET  1989  . LEFT HEART CATH AND CORONARY ANGIOGRAPHY N/A 12/19/2017   Procedure: LEFT HEART CATH AND CORONARY ANGIOGRAPHY;  Surgeon: [name], MD;  Location: [name];  Service: Cardiovascular;  Laterality: N/A;  LAD   . PROSTATE SURGERY  2002  . PROSTATE SURGERY  2002  . REFRACTIVE SURGERY Bilateral   . SHOULDER ARTHROSCOPY WITH SUBACROMIAL DECOMPRESSION, ROTATOR CUFF REPAIR AND BICEP TENDON REPAIR Left 2013  . TONSILLECTOMY  ~ 1956  . TRANSTHORACIC ECHOCARDIOGRAM  10-16-2012   normal LVF, ef 55-65%,  mild AV sclerosis  without stenosis,  mild MR and TR,  mild LAE,  trivial PR  . TRANSURETHRAL RESECTION OF BLADDER TUMOR WITH GYRUS (TURBT-GYRUS)  1989   and URETEROSCOPIC STONE EXTRACTION     Current Outpatient Medications  Medication Sig Dispense Refill  . allopurinol (ZYLOPRIM) 100 MG tablet Take 100 mg by mouth daily.    Marland Kitchen aspirin 81 MG chewable tablet Chew 1 tablet (81 mg total) by mouth daily.    Marland Kitchen ezetimibe (ZETIA) 10 MG tablet Take 1 tablet (10 mg total) by mouth daily. 30 tablet 3  . isosorbide mononitrate (IMDUR) 30 MG 24 hr tablet Take 0.5 tablets (15 mg  total) by mouth daily. 90 tablet 3  . metoprolol tartrate (LOPRESSOR) 25 MG tablet Take 0.5 tablets (12.5 mg total) by mouth 2 (two) times daily. 90 tablet 2  . Pitavastatin Calcium (LIVALO) 1 MG TABS Take 10 tablets (10 mg total) by mouth daily. (Patient taking differently: Take 1 mg by mouth daily. ) 30 tablet 3  . testosterone cypionate (DEPOTESTOTERONE CYPIONATE) 200 MG/ML injection Inject 200 mg into the muscle every 28 (twenty-eight) days.     . ticagrelor (BRILINTA) 90 MG TABS tablet Take 1 tablet (90 mg total) by mouth 2 (two) times daily. 180 tablet 3  . meloxicam (MOBIC) 15 MG tablet Take 15 mg by mouth as needed for pain (for bursitis).    . valsartan-hydrochlorothiazide (DIOVAN-HCT) 160-12.5 MG tablet TAKE 1 TABLET BY MOUTH EVERY DAY (Patient not taking: Reported on 01/01/2018) 90 tablet 3   No current facility-administered medications for this visit.     Allergies:   Patient has no known allergies.    Social History:  The patient  reports that he has never smoked. He quit smokeless tobacco use about 28 years ago. His smokeless tobacco use included chew. He reports that he drinks about 1.2 oz of alcohol per week. He reports that he has current or past drug history.   Family History:  The patient's family history includes Aneurysm in his paternal grandfather; Arthritis in his father and sister; Breast cancer in his mother; Heart attack in his father; Heart disease in his father; Hypertension in his father; Lung cancer in his maternal grandfather; Other in his paternal grandmother; Stroke in his maternal grandmother; Thyroid disease in his mother.    ROS: All other systems are reviewed and negative. Unless otherwise mentioned in H&P    PHYSICAL EXAM: VS:  BP 116/76   Pulse (!) 53   Ht 5\' 9"  (1.753 m)   Wt 242 lb 6.4 oz (110 kg)   BMI 35.80 kg/m  , BMI Body mass index is 35.8 kg/m. GEN: Well nourished, well developed, in no acute distress  HEENT: normal  Neck: no JVD, carotid  bruits, or masses Cardiac: RRR; no murmurs, rubs, or gallops,no edema  Respiratory:  clear to auscultation bilaterally, normal work of breathing GI: soft, nontender, nondistended, + BS MS: no deformity or atrophy  Skin: warm and dry, no rash Neuro:  Strength and sensation are intact Psych: euthymic mood, full affect   EKG: Sinus bradycardia rate of 53 bpm with incomplete RBBB.   Recent Labs: 12/20/2017: BUN 15; Creatinine, Ser 1.42; Hemoglobin 14.8; Platelets 190; Potassium 3.7; Sodium 136    Lipid Panel No results found for: CHOL, TRIG, HDL, CHOLHDL, VLDL, LDLCALC, LDLDIRECT    Wt Readings from Last 3 Encounters:  01/01/18 242 lb 6.4 oz (110 kg)  12/20/17 235 lb 14.3 oz (107 kg)  12/18/17 242 lb 9.6 oz (  110 kg)      Other studies Reviewed: Cardiac Cath 12/19/2017  Conclusion     Mid LAD lesion is 80% stenosed.  Ost LAD to Prox LAD lesion is 20% stenosed.  Prox LAD-1 lesion is 30% stenosed.  Prox LAD-2 lesion is 80% stenosed.  Ost 1st Mrg lesion is 60% stenosed.  Prox RCA to Mid RCA lesion is 50% stenosed.  Mid RCA to Dist RCA lesion is 70% stenosed.  Dist RCA-1 lesion is 80% stenosed.  Dist RCA-2 lesion is 95% stenosed.  The left ventricular ejection fraction is 50-55% by visual estimate.  LV end diastolic pressure is normal.  The left ventricular systolic function is normal.   Low normal global LV function with an EF of 50-55% without definitive focal segmental wall motion abnormalities.  Significant coronary calcification with vessel CAD.  The LAD had proximal 20-30% stenosis and a calcified segment and then following a sharp and had 80% stenosis after the first diagonal vessel and 80% distal stenosis;  60% focal OM1 stenosis prior to a bifurcation; calcified RCA with possible 50% stenosis on a proximal bend in the vessel.  There is diffuse 70-80-95% stenoses  beyond the acute margin extending up to the takeoff of the PDA.  There is collateralization of  the PDA and PLA vessels via the distal circumflex.  Difficult but successful PCI to the calcified LAD with ultimate insertion of a 3.016 mm Synergy DES stent to the proximal lesion with the 80% stenosis being reduced to 0%, and PTCA of the distal LAD lesion with the 80% stenosis being reduced to less than 5% utilizing bivalirudin and aspirin/Brilinta.  RECOMMENDATION: DAPT for a mininum of one year. Following this initial procedure he will be started on anti-ischemic medical therapy including beta blocker and nitrates.  Will review angiograms with colleagues.  The distal RCA is collateralized.  Consider attempt at initial medical therapy of the diffusely diseased distal calcified RCA versus staged  PCI.     ASSESSMENT AND PLAN:  1.  CAD: S/P cardiac cath revealing PCI to the calcified LAD with ultimate insertion of a 3.016 mm Synergy DES stent to the proximal lesion with the 80% stenosis being reduced to 0%, and PTCA of the distal LAD lesion with the 80% stenosis being reduced to less than 5%   I think he may be having some side effects from isosorbide concerning his headache.  I am going to reduce isosorbide to 15 mg daily as his blood pressure is soft and he may better.  He is to call us if he has recurrent angina with reduced dose of isosorbide.  Concerning shortness of breath after taking Brilinta, I have advised that he drinks a small amount of caffeine to see if this is helpful to him.  If this becomes more worrisome to him after approximately 6 weeks, he should let us know and we may need to adjust.  2.  Hypercholesterolemia: The patient will continue atorvastatin 80 mg daily.  He will have follow-up lipids and LFTs in 3 months prior to next appointment.  He has provided me with his current cholesterol status that was completed last month.  Total cholesterol 238, LDL 154, HDL 58.  3.  Hypertension: Blood pressures currently well controlled.  No changes in his regimen at this  time.  Current medicines are reviewed at length with the patient today.    Labs/ tests ordered today include: Fasting L/L in 3 months.  [place] Pugh, ANP, AACC  01/01/2018 12:58 PM    [facility] [address] [place], [place] [address] Phone: [phone]; Fax: [phone]

## 2018-01-01 ENCOUNTER — Ambulatory Visit (INDEPENDENT_AMBULATORY_CARE_PROVIDER_SITE_OTHER): Payer: PPO | Admitting: Adult Health

## 2018-01-01 ENCOUNTER — Encounter: Payer: Self-pay | Admitting: Adult Health

## 2018-01-01 VITALS — BP 116/76 | HR 53 | Ht 69.0 in | Wt 242.4 lb

## 2018-01-01 DIAGNOSIS — E7800 Pure hypercholesterolemia, unspecified: Secondary | ICD-10-CM

## 2018-01-01 DIAGNOSIS — Z79899 Other long term (current) drug therapy: Secondary | ICD-10-CM | POA: Diagnosis not present

## 2018-01-01 DIAGNOSIS — I10 Essential (primary) hypertension: Secondary | ICD-10-CM | POA: Diagnosis not present

## 2018-01-01 DIAGNOSIS — I25119 Atherosclerotic heart disease of native coronary artery with unspecified angina pectoris: Secondary | ICD-10-CM | POA: Diagnosis not present

## 2018-01-01 MED ORDER — ISOSORBIDE MONONITRATE ER 30 MG PO TB24: 15.0000 mg | ORAL_TABLET | Freq: Every day | ORAL | 3 refills | Status: DC

## 2018-01-01 NOTE — Patient Instructions (Signed)
Medication Instructions:  DECREASE ISOSORBIDE 15MG  DAILY  If you need a refill on your cardiac medications before your next appointment, please call your pharmacy.  Labwork: IN 3 MONTHS FLP AND LFT BEFORE FOLLOW UP APPT HERE IN OUR OFFICE AT [facility]  Take the provided lab slips for you to take with you to the lab for you blood draw.   You will need to fast. DO NOT EAT OR DRINK PAST MIDNIGHT.   Special Instructions: TRY TO TAKE BRILLINTA WITH CAFFEINE TO DECREASE SIDE EFFECTS  Follow-Up: Your physician wants you to follow-up in: 3 MONTHS WITH DR Claiborne Billings    Thank you for choosing [facility]!!

## 2018-01-02 ENCOUNTER — Telehealth (HOSPITAL_COMMUNITY): Payer: Self-pay

## 2018-01-02 NOTE — Telephone Encounter (Signed)
Called to follow up with patient in regards to Cardiac Rehab - Patient stated he is not interested in the program. Closed referral.

## 2018-01-16 ENCOUNTER — Other Ambulatory Visit: Payer: Self-pay

## 2018-01-16 MED ORDER — PITAVASTATIN CALCIUM 1 MG PO TABS: 1.0000 mg | ORAL_TABLET | Freq: Every day | ORAL | 3 refills | Status: DC

## 2018-01-23 DIAGNOSIS — E291 Testicular hypofunction: Secondary | ICD-10-CM | POA: Diagnosis not present

## 2018-01-31 ENCOUNTER — Ambulatory Visit: Payer: PPO | Admitting: Gastroenterology

## 2018-01-31 ENCOUNTER — Encounter: Payer: Self-pay | Admitting: Gastroenterology

## 2018-01-31 VITALS — BP 100/58 | HR 49 | Ht 69.5 in | Wt 236.0 lb

## 2018-01-31 DIAGNOSIS — R6881 Early satiety: Secondary | ICD-10-CM

## 2018-01-31 DIAGNOSIS — R140 Abdominal distension (gaseous): Secondary | ICD-10-CM | POA: Diagnosis not present

## 2018-01-31 DIAGNOSIS — K2960 Other gastritis without bleeding: Secondary | ICD-10-CM | POA: Diagnosis not present

## 2018-01-31 NOTE — Patient Instructions (Signed)
If you are age 68 or older, your body mass index should be between 23-30. Your Body mass index is 34.35 kg/m. If this is out of the aforementioned range listed, please consider follow up with your Primary Care Provider.  If you are age 91 or younger, your body mass index should be between 19-25. Your Body mass index is 34.35 kg/m. If this is out of the aformentioned range listed, please consider follow up with your Primary Care Provider.   Please take over the counter omeprazole 20 mg once a day.  Follow up as needed.  Thank you for choosing [facility]  Dr Wilfrid Lund III

## 2018-01-31 NOTE — Progress Notes (Signed)
[name]  Chief Complaint: bloating  Subjective  History:  Seen 2/4 for bloating and early satiety [place] had seen gallstones, not clear related to symptoms. EGD erosive gastritis and duodenitis.  Bx neg for H pylori.  Trial 28 days prilosec and stop meloxicam. I reviewed his 3/25 Cardiology office consult - led to cath.  Multivessel calcified CAD, had DES to one LAD lesions and PTI to another.He has not noticed an improvement in exercise tolerance or DOE. Able to walk 2 miles  Ryan Scott follows up for his abdominal bloating.  He had little improvement on a 28-day course of Prilosec.  After eating he tends to notice a protuberance of the upper abdomen feels like he gets full easily.  There is no nausea or vomiting dysphagia or weight loss.   ROS: Cardiovascular:  no chest pain Respiratory: Dyspnea with exertion as before  The patient's Past Medical, Family and Social History were reviewed and are on file in the EMR.  Objective:  Med list reviewed  Current Outpatient Medications:  .  allopurinol (ZYLOPRIM) 100 MG tablet, Take 100 mg by mouth daily., Disp: , Rfl:  .  aspirin 81 MG chewable tablet, Chew 1 tablet (81 mg total) by mouth daily., Disp: , Rfl:  .  ezetimibe (ZETIA) 10 MG tablet, Take 1 tablet (10 mg total) by mouth daily., Disp: 30 tablet, Rfl: 3 .  isosorbide mononitrate (IMDUR) 30 MG 24 hr tablet, Take 0.5 tablets (15 mg total) by mouth daily., Disp: 90 tablet, Rfl: 3 .  metoprolol tartrate (LOPRESSOR) 25 MG tablet, Take 0.5 tablets (12.5 mg total) by mouth 2 (two) times daily., Disp: 90 tablet, Rfl: 2 .  Pitavastatin Calcium (LIVALO) 1 MG TABS, Take 1 tablet (1 mg total) by mouth daily., Disp: 30 tablet, Rfl: 3 .  testosterone cypionate (DEPOTESTOTERONE CYPIONATE) 200 MG/ML injection, Inject 200 mg into the muscle every 28 (twenty-eight) days. , Disp: , Rfl:  .  ticagrelor (BRILINTA) 90 MG TABS tablet, Take 1 tablet (90 mg total) by mouth 2 (two)  times daily., Disp: 180 tablet, Rfl: 3   Vital signs in last 24 hrs: Vitals:   01/31/18 0833  BP: (!) 100/58  Pulse: (!) 49    Physical Exam   HEENT: sclera anicteric, oral mucosa moist without lesions  Neck: supple, no thyromegaly, JVD or lymphadenopathy  Cardiac: RRR without murmurs, S1S2 heard, no peripheral edema  Pulm: clear to auscultation bilaterally, normal RR and effort noted  Abdomen: soft, no tenderness, with active bowel sounds. No guarding or palpable hepatosplenomegaly.  Rectus diastasis as before   skin; warm and dry, no jaundice or rash  Recent Labs:  CBC Latest Ref Rng & Units 12/20/2017 12/18/2017 05/16/2013  WBC 4.0 - 10.5 K/uL 8.8 6.9 6.9  Hemoglobin 13.0 - 17.0 g/dL 14.8 17.0 17.2(H)  Hematocrit 39.0 - 52.0 % 44.7 49.8 49.9  Platelets 150 - 400 K/uL 190 236 231   Gastric biopsy negative for H. pylori  Radiologic studies:  EKG 01/01/18 - Qtc normal, no ischemic changes  @ASSESSMENTPLANBEGIN @ Assessment: Encounter Diagnoses  Name Primary?  . Abdominal bloating Yes  . Early satiety   . Erosive gastritis    Nonspecific dyspepsia with early satiety and bloating.  I suspect he has a mild gastric dysmotility related to accommodation or emptying.  Perhaps the gastritis was not contributing much.  Nevertheless, I feel he should continue once daily low-dose PPI since he is on dual antiplatelet therapy and had gastritis  on my EGD.  He was given some dietary advice.  I do not think further testing is needed or new medicines at this point.  He will see me as needed.  Total time 20 minutes, over half spent face-to-face with patient in counseling and coordination of care.   [name]

## 2018-02-13 DIAGNOSIS — S6990XA Unspecified injury of unspecified wrist, hand and finger(s), initial encounter: Secondary | ICD-10-CM | POA: Diagnosis not present

## 2018-02-13 DIAGNOSIS — Z6833 Body mass index (BMI) 33.0-33.9, adult: Secondary | ICD-10-CM | POA: Diagnosis not present

## 2018-02-20 DIAGNOSIS — E291 Testicular hypofunction: Secondary | ICD-10-CM | POA: Diagnosis not present

## 2018-03-04 ENCOUNTER — Other Ambulatory Visit: Payer: Self-pay | Admitting: Adult Health

## 2018-03-12 DIAGNOSIS — S40022A Contusion of left upper arm, initial encounter: Secondary | ICD-10-CM | POA: Diagnosis not present

## 2018-03-28 DIAGNOSIS — E291 Testicular hypofunction: Secondary | ICD-10-CM | POA: Diagnosis not present

## 2018-04-02 DIAGNOSIS — Z79899 Other long term (current) drug therapy: Secondary | ICD-10-CM | POA: Diagnosis not present

## 2018-04-02 LAB — LIPID PANEL
CHOL/HDL RATIO: 3.1 ratio (ref 0.0–5.0)
CHOLESTEROL TOTAL: 138 mg/dL (ref 100–199)
HDL: 45 mg/dL (ref >39)
LDL Calculated: 78 mg/dL (ref 0–99)
TRIGLYCERIDES: 77 mg/dL (ref 0–149)
VLDL Cholesterol Cal: 15 mg/dL (ref 5–40)

## 2018-04-02 LAB — HEPATIC FUNCTION PANEL
ALK PHOS: 51 IU/L (ref 39–117)
ALT: 24 IU/L (ref 0–44)
AST: 21 IU/L (ref 0–40)
Albumin: 4.4 g/dL (ref 3.6–4.8)
BILIRUBIN TOTAL: 1.2 mg/dL (ref 0.0–1.2)
BILIRUBIN, DIRECT: 0.27 mg/dL (ref 0.00–0.40)
Total Protein: 7.1 g/dL (ref 6.0–8.5)

## 2018-04-11 ENCOUNTER — Other Ambulatory Visit: Payer: Self-pay | Admitting: Adult Health

## 2018-04-13 ENCOUNTER — Encounter: Payer: Self-pay | Admitting: Cardiovascular Disease

## 2018-04-13 ENCOUNTER — Ambulatory Visit (INDEPENDENT_AMBULATORY_CARE_PROVIDER_SITE_OTHER): Payer: PPO | Admitting: Cardiovascular Disease

## 2018-04-13 VITALS — BP 120/70 | HR 50 | Ht 69.5 in | Wt 224.0 lb

## 2018-04-13 DIAGNOSIS — R918 Other nonspecific abnormal finding of lung field: Secondary | ICD-10-CM | POA: Diagnosis not present

## 2018-04-13 DIAGNOSIS — I25119 Atherosclerotic heart disease of native coronary artery with unspecified angina pectoris: Secondary | ICD-10-CM

## 2018-04-13 DIAGNOSIS — Z9861 Coronary angioplasty status: Secondary | ICD-10-CM | POA: Diagnosis not present

## 2018-04-13 DIAGNOSIS — I10 Essential (primary) hypertension: Secondary | ICD-10-CM | POA: Diagnosis not present

## 2018-04-13 DIAGNOSIS — E782 Mixed hyperlipidemia: Secondary | ICD-10-CM | POA: Diagnosis not present

## 2018-04-13 DIAGNOSIS — I2510 Atherosclerotic heart disease of native coronary artery without angina pectoris: Secondary | ICD-10-CM

## 2018-04-13 MED ORDER — PITAVASTATIN CALCIUM 1 MG PO TABS: 2.0000 | ORAL_TABLET | Freq: Every day | ORAL | 1 refills | Status: DC

## 2018-04-13 MED ORDER — CLOPIDOGREL BISULFATE 75 MG PO TABS: 75.0000 mg | ORAL_TABLET | Freq: Every day | ORAL | 3 refills | Status: DC

## 2018-04-13 NOTE — Patient Instructions (Signed)
Medication Instructions:  Your physician has recommended you make the following change in your medication:  1) STOP Brilinta  2) START Plavix 75 mg tablet by mouth:   - Saturday, July 20 take TWO tablets (150 mg) by mouth once daily  - Sunday, July 21 take TWO tables (150mg ) by mouth once daily  - Monday, July 22 START taking ONE tablet (75mg ) by mouth ONCE daily  3) INCREASE Livalo to 2 mg (two tablets) by mouth once daily   Labwork: Your physician recommends that you return for lab work in:   - Week of August 5th - Go to [facility] for a lab test P2Y12 (paper order given) - you do not need to  Fast   - September 2019 - RETURN to our office for follow up blood work - you need to be FASTING (CMET/LIPID)   Testing/Procedures: none  Follow-Up: Your physician wants you to follow-up in: 4 months with [name]. You will receive a reminder letter in the mail two months in advance. If you don't receive a letter, please call our office to schedule the follow-up appointment.   Any Other Special Instructions Will Be Listed Below (If Applicable).     If you need a refill on your cardiac medications before your next appointment, please call your pharmacy.

## 2018-04-13 NOTE — Progress Notes (Signed)
Cardiology Office Note    Date:  04/13/2018   ID:  Ryan Scott, Ryan Scott [birth year], MRN [id number]  PCP:  [name], MD  Cardiologist:  Shelva Majestic, MD   Initial office visit with me  History of Present Illness:  Ryan Scott is a 67 y.o. male who remotely had seen Dr. Rollene Fare.  He was seen in March 2019 by Jory Sims, NP referred by Dr. Harlene Ramus after his CT of his chest healed significant coronary calcification.  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  He had noticed more shortness of breath but denied any significant chest pain symptomatology.  With his symptoms and CT scan, he was referred for cardiac catheterization.  On December 19, 2017 cardiac catheterization performed by me showed low normal LV function with an EF of 50 to 55%.  There was significant coronary calcification 20 to 30% proximal LAD stenosis and then 80% stenosis after the first diagonal with 80% distal stenosis.  Circumflex had 60% focal OM1 stenosis prior to bifurcation and the RCA was significantly calcified with 50% stenosis in the proximal bend of the vessel and diffuse 70, 80, and 95% stenosis beyond the acute margin extending up to the takeoff of the PDA.  The PDA and PLA were collateralized via the distal circumflex.  He underwent a difficult but successful PCI to the calcified LAD with ultimate insertion of a 3.0 x 16 mm Synergy DES stent to the proximal lesion PTCA of the distal LAD.  Subsequently, he has been on aspirin and Brilinta and had been on isosorbide 30 mg in addition to metoprolol 12.5 mg twice a day.  He also has been on Zetia 10 mg in addition to Livalo reportedly at 1 mg daily.  He has experienced shortness of breath really when he lies down.  He denies any significant volume issues.  He has noticed some slight improvement with dyspnea following coffee.  However, he notes this almost daily.  He had seen Jory Sims, NP in initial  follow-up of his intervention.  He presents to the office for initial evaluation with me today    Past Medical History:  Diagnosis Date  . Arthritis    "back, neck, shoulders" (12/19/2017)  . Bladder cancer ([place])   . Borderline hypertension   . BPH (benign prostatic hyperplasia)   . Chronic lower back pain   . ED (erectile dysfunction)   . High cholesterol    "started BP RX 12/18/2017"  . History of adenomatous polyp of colon    tubular adenoma  . History of kidney stones   . Hypertension   . Hypogonadism male   . Left ureteral stone   . Lower urinary tract symptoms (LUTS)   . RBBB (right bundle branch block)     Past Surgical History:  Procedure Laterality Date  . CARDIOVASCULAR STRESS TEST  10-16-2012   normal myocardial perfusion study,  no ischemia/  not gated  . COLONOSCOPY  last one 07-26-2013  . CORONARY STENT INTERVENTION N/A 12/19/2017   Procedure: CORONARY STENT INTERVENTION;  Surgeon: [name], MD;  Location: [facility];  Service: Cardiovascular;  Laterality: N/A;  . CYSTOSCOPY/RETROGRADE/URETEROSCOPY/STONE EXTRACTION WITH BASKET  1989  . LEFT HEART CATH AND CORONARY ANGIOGRAPHY N/A 12/19/2017   Procedure: LEFT HEART CATH AND CORONARY ANGIOGRAPHY;  Surgeon: [name], MD;  Location: [facility];  Service: Cardiovascular;  Laterality: N/A;  LAD   .  PROSTATE SURGERY  2002  . PROSTATE SURGERY  2002  . REFRACTIVE SURGERY Bilateral   . SHOULDER ARTHROSCOPY WITH SUBACROMIAL DECOMPRESSION, ROTATOR CUFF REPAIR AND BICEP TENDON REPAIR Left 2013  . TONSILLECTOMY  ~ 1956  . TRANSTHORACIC ECHOCARDIOGRAM  10-16-2012   normal LVF, ef 55-65%,  mild AV sclerosis without stenosis,  mild MR and TR,  mild LAE,  trivial PR  . TRANSURETHRAL RESECTION OF BLADDER TUMOR WITH GYRUS (TURBT-GYRUS)  1989   and URETEROSCOPIC STONE EXTRACTION    Current Medications: Outpatient Medications Prior to Visit  Medication Sig Dispense Refill  . allopurinol (ZYLOPRIM)  100 MG tablet Take 100 mg by mouth daily.    Marland Kitchen aspirin 81 MG chewable tablet Chew 1 tablet (81 mg total) by mouth daily.    Marland Kitchen ezetimibe (ZETIA) 10 MG tablet TAKE 1 TABLET BY MOUTH EVERY DAY 30 tablet 2  . isosorbide mononitrate (IMDUR) 30 MG 24 hr tablet Take 0.5 tablets (15 mg total) by mouth daily. 90 tablet 3  . metoprolol tartrate (LOPRESSOR) 25 MG tablet Take 0.5 tablets (12.5 mg total) by mouth 2 (two) times daily. 90 tablet 2  . testosterone cypionate (DEPOTESTOTERONE CYPIONATE) 200 MG/ML injection Inject 200 mg into the muscle every 28 (twenty-eight) days.     Marland Kitchen LIVALO 1 MG TABS TAKE 1 TABLET BY MOUTH EVERY DAY (Patient taking differently: TAKE 1/2 TABLET BY MOUTH EVERY DAY) 90 tablet 1  . ticagrelor (BRILINTA) 90 MG TABS tablet Take 1 tablet (90 mg total) by mouth 2 (two) times daily. 180 tablet 3   No facility-administered medications prior to visit.      Allergies:   Patient has no known allergies.   Social History   Socioeconomic History  . Marital status: Married    Spouse name: Not on file  . Number of children: Not on file  . Years of education: Not on file  . Highest education level: Not on file  Occupational History  . Not on file  Social Needs  . Financial resource strain: Not on file  . Food insecurity:    Worry: Not on file    Inability: Not on file  . Transportation needs:    Medical: Not on file    Non-medical: Not on file  Tobacco Use  . Smoking status: Never Smoker  . Smokeless tobacco: Former Systems developer    Types: Chew  Substance and Sexual Activity  . Alcohol use: Yes    Alcohol/week: 1.2 oz    Types: 2 Standard drinks or equivalent per week  . Drug use: Not Currently  . Sexual activity: Not on file  Lifestyle  . Physical activity:    Days per week: Not on file    Minutes per session: Not on file  . Stress: Not on file  Relationships  . Social connections:    Talks on phone: Not on file    Gets together: Not on file    Attends religious service:  Not on file    Active member of club or organization: Not on file    Attends meetings of clubs or organizations: Not on file    Relationship status: Not on file  Other Topics Concern  . Not on file  Social History Narrative  . Not on file     Family History:  The patient's family history includes Aneurysm in his paternal grandfather; Arthritis in his father and sister; Breast cancer in his mother; Heart attack in his father; Heart disease in his father; Hypertension  in his father; Lung cancer in his maternal grandfather; Other in his paternal grandmother; Stroke in his maternal grandmother; Thyroid disease in his mother.   ROS General: Negative; No fevers, chills, or night sweats;  HEENT: Negative; No changes in vision or hearing, sinus congestion, difficulty swallowing Pulmonary: Mild shortness of breath.  Possible silicosis, not definitively diagnosed Cardiovascular: Negative; No chest pain, presyncope, syncope, palpitations GI: Negative; No nausea, vomiting, diarrhea, or abdominal pain GU: Negative; No dysuria, hematuria, or difficulty voiding Musculoskeletal: Negative; no myalgias, joint pain, or weakness Hematologic/Oncology: Negative; no easy bruising, bleeding Endocrine: Negative; no heat/cold intolerance; no diabetes Neuro: Negative; no changes in balance, headaches Skin: Negative; No rashes or skin lesions Psychiatric: Negative; No behavioral problems, depression Sleep: Negative; No snoring, daytime sleepiness, hypersomnolence, bruxism, restless legs, hypnogognic hallucinations, no cataplexy Other comprehensive 14 point system review is negative.   PHYSICAL EXAM:   VS:  BP 120/70   Pulse (!) 50   Ht 5' 9.5" (1.765 m)   Wt 224 lb (101.6 kg)   BMI 32.60 kg/m     Repeat blood pressure by me was 116/70  Wt Readings from Last 3 Encounters:  04/13/18 224 lb (101.6 kg)  01/31/18 236 lb (107 kg)  01/01/18 242 lb 6.4 oz (110 kg)    General: Alert, oriented, no distress.    Skin: normal turgor, no rashes, warm and dry HEENT: Normocephalic, atraumatic. Pupils equal round and reactive to light; sclera anicteric; extraocular muscles intact;  Nose without nasal septal hypertrophy Mouth/Parynx benign; Mallinpatti scale 3 Neck: No JVD, no carotid bruits; normal carotid upstroke Lungs: clear to ausculatation and percussion; no wheezing or rales Chest wall: without tenderness to palpitation Heart: PMI not displaced, RRR, s1 s2 normal, 1/6 systolic murmur, no diastolic murmur, no rubs, gallops, thrills, or heaves Abdomen: soft, nontender; no hepatosplenomehaly, BS+; abdominal aorta nontender and not dilated by palpation. Back: no CVA tenderness Pulses 2+ Musculoskeletal: full range of motion, normal strength, no joint deformities Extremities: Trivial right ankle edema.  No clubbing cyanosis, Homan's sign negative  Neurologic: grossly nonfocal; Cranial nerves grossly wnl Psychologic: Normal mood and affect   Studies/Labs Reviewed:   EKG:  EKG is  ordered today.  ECG (independently read by me): Sinus bradycardia at 50 bpm.  Mild first-degree AV block with a PR interval of 218 ms.  Incomplete right bundle branch block.  Recent Labs: BMP Latest Ref Rng & Units 12/20/2017 12/18/2017 05/16/2013  Glucose 65 - 99 mg/dL 100(H) 81 86  BUN 6 - 20 mg/dL 15 12 19   Creatinine 0.61 - 1.24 mg/dL 1.42(H) 1.29(H) 1.36(H)  BUN/Creat Ratio 10 - 24 - 9(L) -  Sodium 135 - 145 mmol/L 136 139 137  Potassium 3.5 - 5.1 mmol/L 3.7 4.4 4.4  Chloride 101 - 111 mmol/L 103 102 103  CO2 22 - 32 mmol/L 25 24 26   Calcium 8.9 - 10.3 mg/dL 8.3(L) 9.4 10.0     Hepatic Function Latest Ref Rng & Units 04/02/2018 05/16/2013  Total Protein 6.0 - 8.5 g/dL 7.1 6.9  Albumin 3.6 - 4.8 g/dL 4.4 4.3  AST 0 - 40 IU/L 21 19  ALT 0 - 44 IU/L 24 20  Alk Phosphatase 39 - 117 IU/L 51 44  Total Bilirubin 0.0 - 1.2 mg/dL 1.2 1.6(H)  Bilirubin, Direct 0.00 - 0.40 mg/dL 0.27 -    CBC Latest Ref Rng & Units  12/20/2017 12/18/2017 05/16/2013  WBC 4.0 - 10.5 K/uL 8.8 6.9 6.9  Hemoglobin 13.0 - 17.0 g/dL  14.8 17.0 17.2(H)  Hematocrit 39.0 - 52.0 % 44.7 49.8 49.9  Platelets 150 - 400 K/uL 190 236 231   Lab Results  Component Value Date   MCV 90.1 12/20/2017   MCV 89 12/18/2017   MCV 87.1 05/16/2013   No results found for: TSH No results found for: HGBA1C   BNP No results found for: BNP  ProBNP No results found for: PROBNP   Lipid Panel     Component Value Date/Time   CHOL 138 04/02/2018 0853   TRIG 77 04/02/2018 0853   HDL 45 04/02/2018 0853   CHOLHDL 3.1 04/02/2018 0853   LDLCALC 78 04/02/2018 0853     RADIOLOGY: No results found.   Additional studies/ records that were reviewed today include:   CT CHEST: IMPRESSION: 1. Two right middle lobe pulmonary nodules are unchanged since 2016 and benign. No additional imaging follow-up is necessary. 2. No acute pulmonary findings. Mild bilateral middle lobe and lower lobe scarring/atelectasis. 3. Extensive coronary artery calcified atherosclerosis and/or stents. ------------------------------------------------------------------------------------------------------  December 19, 2017 cardiac catheterization/PCI:  Low normal global LV function with an EF of 50-55% without definitive focal segmental wall motion abnormalities.  Significant coronary calcification with vessel CAD.  The LAD had proximal 20-30% stenosis and a calcified segment and then following a sharp and had 80% stenosis after the first diagonal vessel and 80% distal stenosis;  60% focal OM1 stenosis prior to a bifurcation; calcified RCA with possible 50% stenosis on a proximal bend in the vessel.  There is diffuse 70-80-95% stenoses  beyond the acute margin extending up to the takeoff of the PDA.  There is collateralization of the PDA and PLA vessels via the distal circumflex.  Difficult but successful PCI to the calcified LAD with ultimate insertion of a 3.016 mm  Synergy DES stent to the proximal lesion with the 80% stenosis being reduced to 0%, and PTCA of the distal LAD lesion with the 80% stenosis being reduced to less than 5% utilizing bivalirudin and aspirin/Brilinta.  RECOMMENDATION: DAPT for a mininum of one year. Following this initial procedure he will be started on anti-ischemic medical therapy including beta blocker and nitrates.  Will review angiograms with colleagues.  The distal RCA is collateralized.  Consider attempt at initial medical therapy of the diffusely diseased distal calcified RCA versus staged  PCI.   ASSESSMENT:    1. Coronary artery disease involving native coronary artery of native heart with angina pectoris ([place])   2. CAD S/P percutaneous coronary angioplasty   3. Mixed hyperlipidemia   4. Essential hypertension   5. Pulmonary nodules      PLAN:  Ryan Scott is a 67 year old gentleman who has a history of mild hypertension, hyperlipidemia, and has documented stable pulmonary nodules and extensive coronary calcification.  He was found to have multivessel CAD as noted above and underwent successful stenting of a calcified proximal LAD stenosis with PTCA of the distal LAD stenosis in March 2019.  At that time he had diffuse disease in his RCA which was significantly narrowed distally but there was good collateralization to the distal RCA from the circumflex territory.  He has been without anginal symptomatology since his catheterization but never experienced significant chest pressure previously.  He admits to noticing more shortness of breath since he was started on dual antiplatelet therapy with aspirin and Brilinta.  There has been some slight improvement following caffeine suggesting the dyspnea may be contributed by ticagrelor.  At this point, I since he did not  have an ACS, I will switch him to oral clopidogrel.  For the next 2 days he will take 150 mg and then initiate 75 mg daily.  In 1 to 2 weeks he will undergo a  P2 Y 12 blood test to assess for Vivek's responsiveness.  I reviewed his recent laboratory.  His most recent LDL was 78.  I have recommended further titration of Livalo to 2 mg daily.  His blood pressure today is stable on his current regimen.  He had experienced headaches on 30 mg isosorbide but this has resolved with dose reduction down to 15 mg which was done at his office visit with Ms. Lawrence.  He continues to be on Zetia.  Target LDL is less than 70.  I will recheck laboratory in 3 months.  He will return in 4 months for follow-up evaluation   Medication Adjustments/Labs and Tests Ordered: Current medicines are reviewed at length with the patient today.  Concerns regarding medicines are outlined above.  Medication changes, Labs and Tests ordered today are listed in the Patient Instructions below. Patient Instructions  Medication Instructions:  Your physician has recommended you make the following change in your medication:  1) STOP Brilinta  2) START Plavix 75 mg tablet by mouth:   - Saturday, July 20 take TWO tablets (150 mg) by mouth once daily  - Sunday, July 21 take TWO tables (164m) by mouth once daily  - Monday, July 22 START taking ONE tablet (713m by mouth ONCE daily  3) INCREASE Livalo to 2 mg (two tablets) by mouth once daily   Labwork: Your physician recommends that you return for lab work in:   - Week [facility] August 5th [facility] order given) - you do not need to  Fast   - September 2019 - RETURN to our office for follow up blood work - you need to be FASTING (CMET/LIPID)   Testing/Procedures: none  Follow-Up: Your physician wants you to follow-up in: 4 months with Dr. KeClaiborne BillingsYou will receive a reminder letter in the mail two months in advance. If you don't receive a letter, please call our office to schedule the follow-up appointment.   Any Other Special Instructions Will Be Listed Below (If Applicable).     If you  need a refill on your cardiac medications before your next appointment, please call your pharmacy.      Signed, ThShelva MajesticMD  04/13/2018 9:28 AM    [place]SuSargentGrPerdido Beach[place]  [id number]: [phone]

## 2018-04-24 DIAGNOSIS — E291 Testicular hypofunction: Secondary | ICD-10-CM | POA: Diagnosis not present

## 2018-04-24 DIAGNOSIS — L0390 Cellulitis, unspecified: Secondary | ICD-10-CM | POA: Diagnosis not present

## 2018-04-28 IMAGING — CT CT CHEST W/O CM
1 series · 15 of 34 positions shown, 19 images · non-contrast
Comparison: Chest CT 09/27/2016, CT Abdomen and Pelvis 08/03/2015
and earlier.

CLINICAL DATA: 66-year-old male with right middle lobe lung nodules
previously value aided on chest CT 09/27/2016, identified on abdomen
CT 08/03/2015. Subsequent encounter.

EXAM:
CT CHEST WITHOUT CONTRAST
TECHNIQUE: Multidetector CT imaging of the chest was performed following the
standard protocol without IV contrast.

[Series 2: chest w/(date) · axial · 0.86mm/px · z∈[-305,-51]mm · 15 of 151 slices shown, 19 images]
[im 12/151  mediastinal]
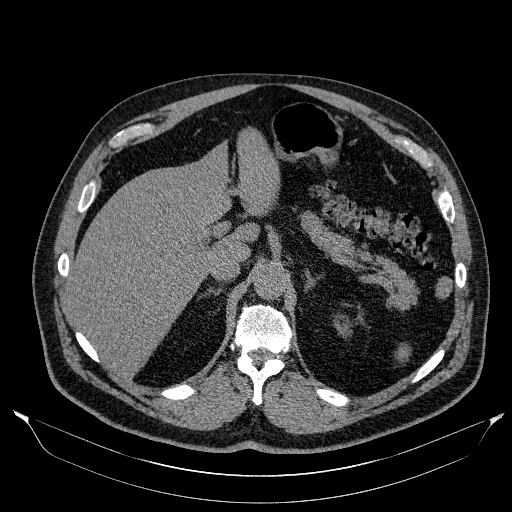
[im 12/151  lung]
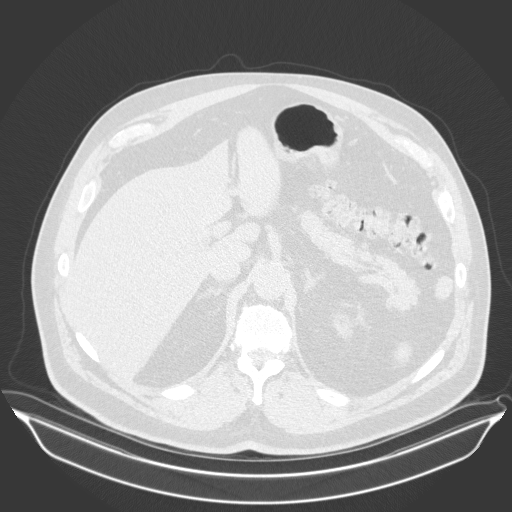
[im 23/151  lung]
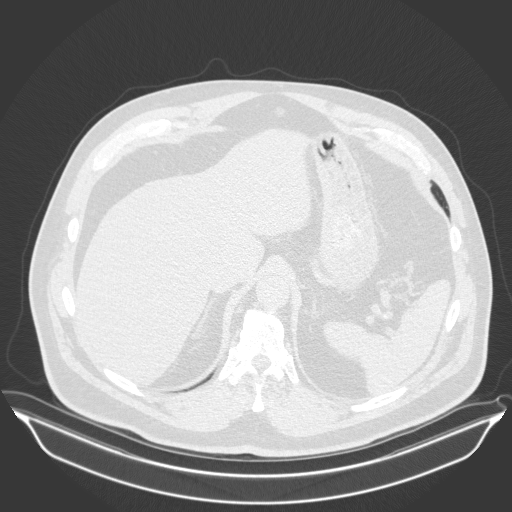
[im 31/151  lung]
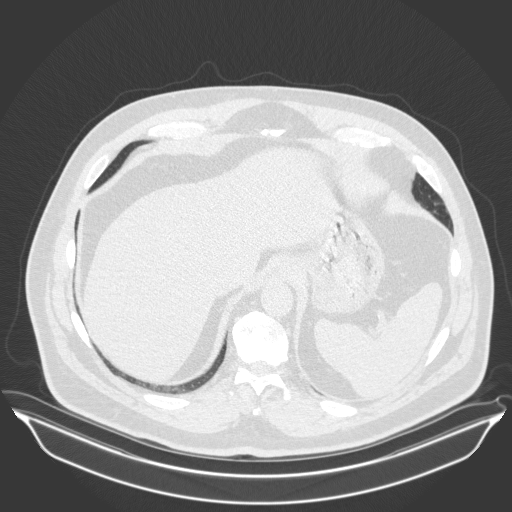
[im 39/151  lung]
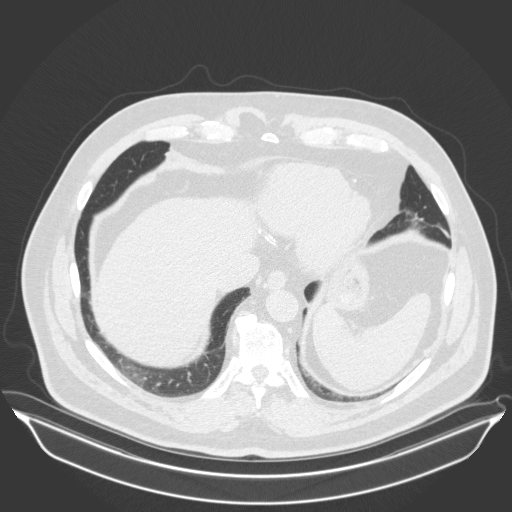
[im 51/151  mediastinal]
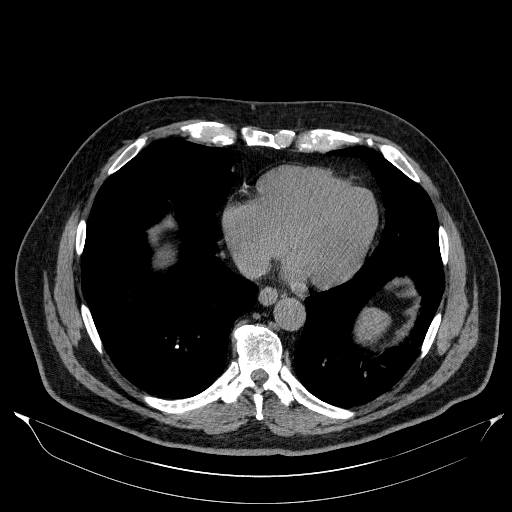
[im 51/151  lung]
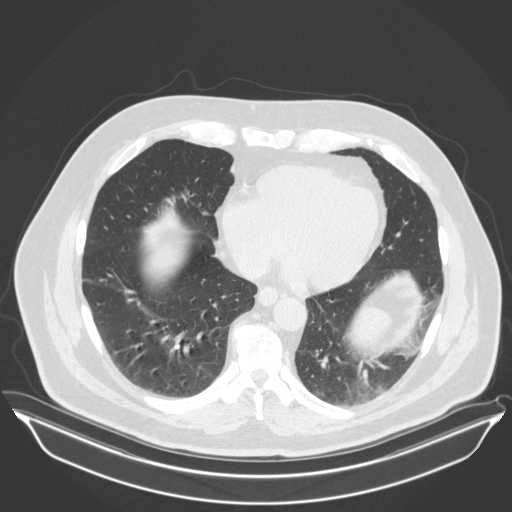
[im 61/151  lung]
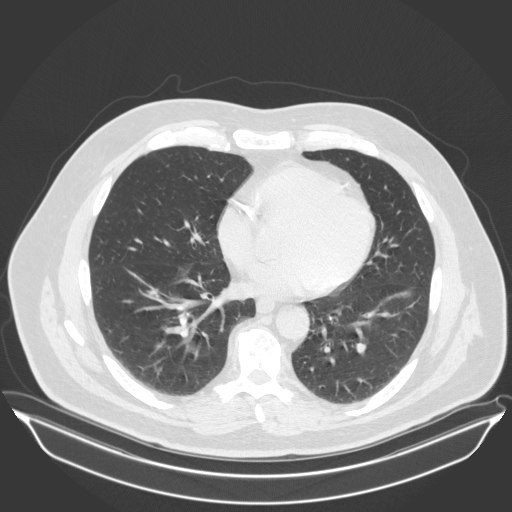
[im 67/151  lung]
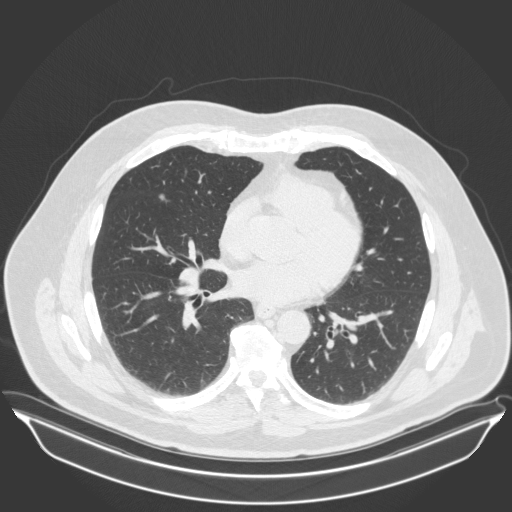
[im 78/151  lung]
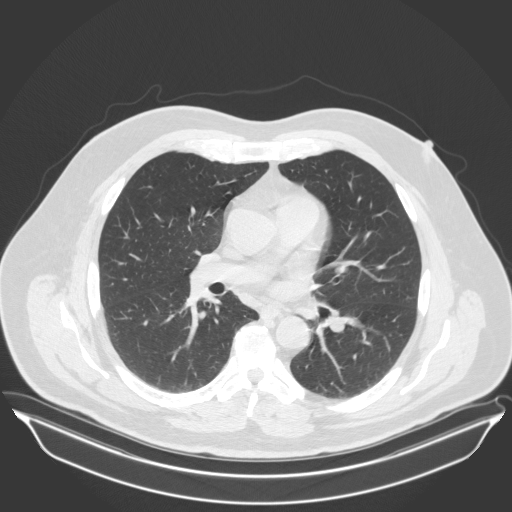
[im 84/151  mediastinal]
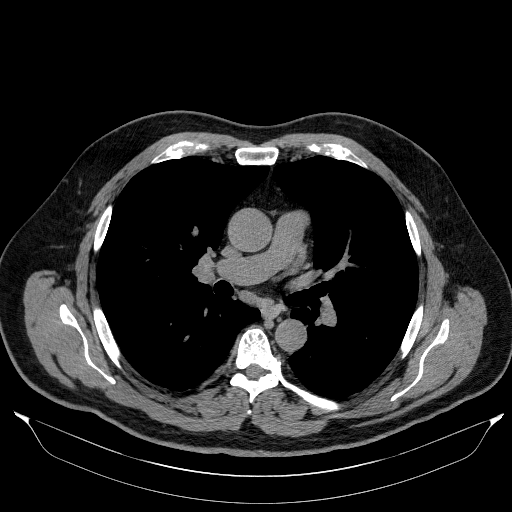
[im 84/151  lung]
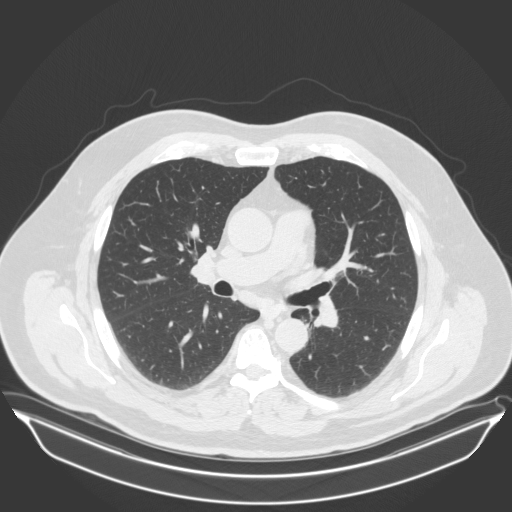
[im 91/151  lung]
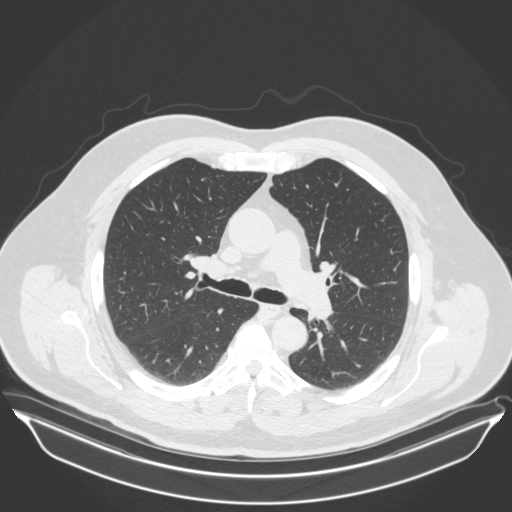
[im 101/151  lung]
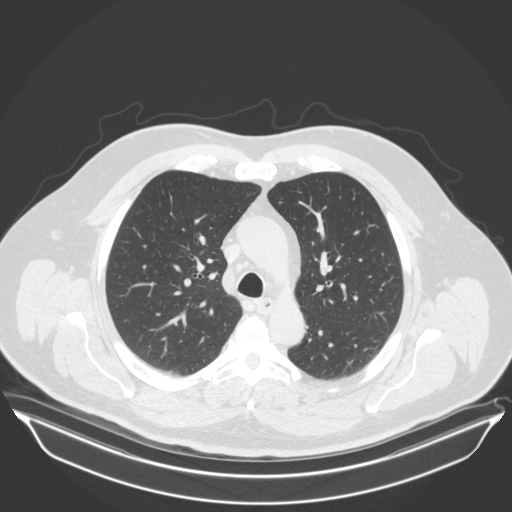
[im 112/151  lung]
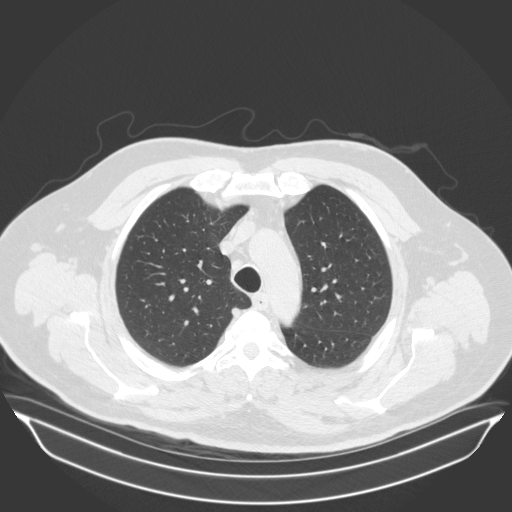
[im 121/151  mediastinal]
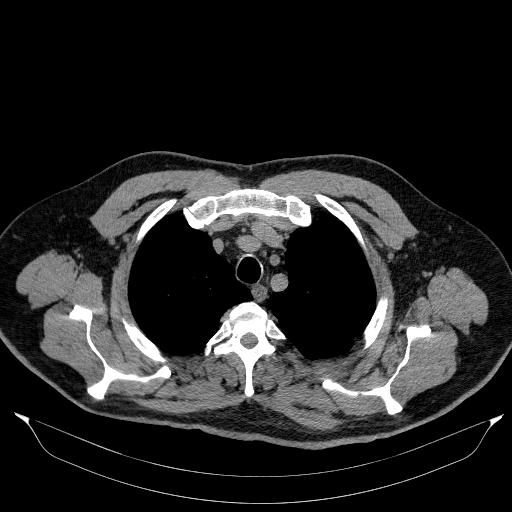
[im 121/151  lung]
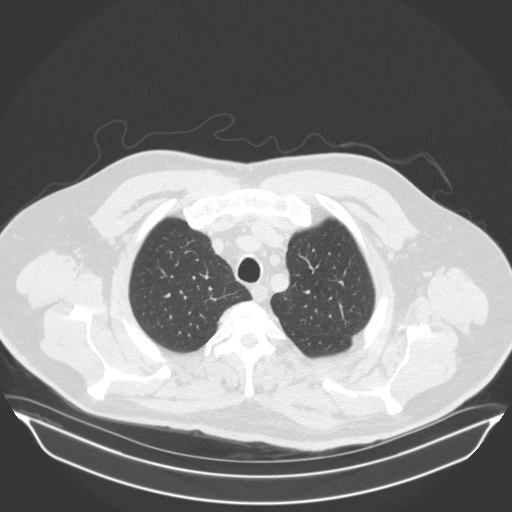
[im 128/151  lung]
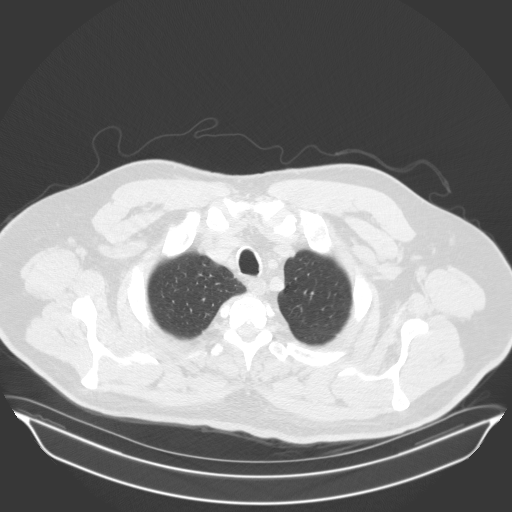
[im 139/151  lung]
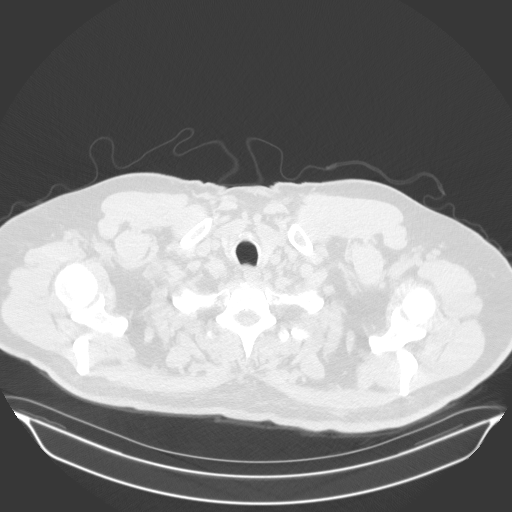

[15 of 34 positions shown; findings below may reference images not displayed]

FINDINGS: Cardiovascular: Calcified coronary artery atherosclerosis and/or
stents (series 2, image 79). No cardiomegaly or pericardial
effusion. No calcified atherosclerosis of the visible aorta.
Vascular patency is not evaluated in the absence of IV contrast.

Mediastinum/Nodes: Stable small mediastinal lymph nodes. No
lymphadenopathy.

Lungs/Pleura: Bilateral major airways are patent.

A lateral segment right middle lobe lung nodule measuring 6-7
millimeters (series 5, image 96 today) is unchanged since
08/03/2015, and benign.

A slightly smaller 5-6 millimeter right middle lobe lung nodule on
series 5, image 86 today is located along the minor fissure as seen
on sagittal image 66. This is also stable since 3258 and benign,
possibly a small intrapleural lymph node.

Chronic atelectasis in the medial segment of the right middle lobe
is stable. Mild chronic scarring and atelectasis in the basal
segments of both lower lobes is stable. Mild scarring in the lingula
costophrenic angle is stable.

No other pulmonary nodule. No other abnormal pulmonary opacity. No
pleural effusion.

Upper Abdomen: Negative visible noncontrast liver, spleen, pancreas,
adrenal glands, and bowel in the upper abdomen. Punctate left upper
pole nephrolithiasis is chronic.

Musculoskeletal: Stable visualized osseous structures.
IMPRESSION: 1. Two right middle lobe pulmonary nodules are unchanged since 3258
and benign. No additional imaging follow-up is necessary.
2. No acute pulmonary findings. Mild bilateral middle lobe and lower
lobe scarring/atelectasis.
3. Extensive coronary artery calcified atherosclerosis and/or
stents.

## 2018-04-30 ENCOUNTER — Other Ambulatory Visit (HOSPITAL_COMMUNITY)
Admission: RE | Admit: 2018-04-30 | Discharge: 2018-04-30 | Disposition: A | Discharge status: Home / Self Care | Payer: PPO | Source: Ambulatory Visit | Attending: Cardiovascular Disease | Admitting: Cardiovascular Disease

## 2018-04-30 DIAGNOSIS — Z9861 Coronary angioplasty status: Secondary | ICD-10-CM | POA: Diagnosis not present

## 2018-04-30 DIAGNOSIS — E782 Mixed hyperlipidemia: Secondary | ICD-10-CM | POA: Insufficient documentation

## 2018-04-30 DIAGNOSIS — I25119 Atherosclerotic heart disease of native coronary artery with unspecified angina pectoris: Secondary | ICD-10-CM | POA: Insufficient documentation

## 2018-04-30 DIAGNOSIS — I2510 Atherosclerotic heart disease of native coronary artery without angina pectoris: Secondary | ICD-10-CM | POA: Insufficient documentation

## 2018-04-30 LAB — PLATELET INHIBITION P2Y12: Platelet Function  P2Y12: 7 [PRU] — ABNORMAL LOW (ref 194–418)

## 2018-05-08 DIAGNOSIS — N401 Enlarged prostate with lower urinary tract symptoms: Secondary | ICD-10-CM | POA: Diagnosis not present

## 2018-05-08 DIAGNOSIS — E291 Testicular hypofunction: Secondary | ICD-10-CM | POA: Diagnosis not present

## 2018-05-09 ENCOUNTER — Telehealth: Payer: Self-pay | Admitting: Cardiovascular Disease

## 2018-05-09 NOTE — Telephone Encounter (Signed)
Please advise. Patient states that since he switched from Brilinta to Plavix, he has had itching all over. It has worsened over the past couple of days.   Patient states he has not started any new medications other than that switch.    Last OV note from Dr.Kelly stated:   Your physician has recommended you make the following change in your medication:  1) STOP Brilinta  2) START Plavix 75 mg tablet by mouth:              - Saturday, July 20 take TWO tablets (150 mg) by mouth once daily             - Sunday, July 21 take TWO tables (150mg ) by mouth once daily             - Monday, July 22 START taking ONE tablet (75mg ) by mouth ONCE daily  3) INCREASE Livalo to 2 mg (two tablets) by mouth once daily   Any advice on how to proceed?  Patient was advised to take benadryl to calm the itching in the meantime.

## 2018-05-09 NOTE — Telephone Encounter (Signed)
Uncertain why is having itching. Have him see PCP. He has itching in the past on Brilinta. Doubt Plavix is the cause.

## 2018-05-09 NOTE — Telephone Encounter (Signed)
Noted stent placement < 5 months ago.  Agree with beanadry for now.  Cardiologist to recommend change in antiplatelet therapy.

## 2018-05-09 NOTE — Telephone Encounter (Signed)
New Message    Pt c/o medication issue:  1. Name of Medication: Plavix   2. How are you currently taking this medication (dosage and times per day)? Take 1 tablet (75 mg total) by mouth daily  3. Are you having a reaction (difficulty breathing--STAT)? itching  4. What is your medication issue? Patient indicates he has been experiencing itching since starting medication.

## 2018-05-09 NOTE — Telephone Encounter (Signed)
Called and notified patient of note. Patient verbalized understanding and will contact PCP.

## 2018-05-14 DIAGNOSIS — R768 Other specified abnormal immunological findings in serum: Secondary | ICD-10-CM | POA: Diagnosis not present

## 2018-05-14 DIAGNOSIS — L299 Pruritus, unspecified: Secondary | ICD-10-CM | POA: Diagnosis not present

## 2018-05-18 DIAGNOSIS — E291 Testicular hypofunction: Secondary | ICD-10-CM | POA: Diagnosis not present

## 2018-05-18 DIAGNOSIS — N5201 Erectile dysfunction due to arterial insufficiency: Secondary | ICD-10-CM | POA: Diagnosis not present

## 2018-05-18 DIAGNOSIS — N200 Calculus of kidney: Secondary | ICD-10-CM | POA: Diagnosis not present

## 2018-05-21 ENCOUNTER — Other Ambulatory Visit: Payer: Self-pay | Admitting: Family Medicine

## 2018-05-21 DIAGNOSIS — R17 Unspecified jaundice: Secondary | ICD-10-CM

## 2018-05-22 ENCOUNTER — Ambulatory Visit
Admission: RE | Admit: 2018-05-22 | Discharge: 2018-05-22 | Disposition: A | Discharge status: Home / Self Care | Payer: PPO | Source: Ambulatory Visit | Attending: Family Medicine | Admitting: Family Medicine

## 2018-05-22 DIAGNOSIS — K760 Fatty (change of) liver, not elsewhere classified: Secondary | ICD-10-CM | POA: Diagnosis not present

## 2018-05-22 DIAGNOSIS — K8020 Calculus of gallbladder without cholecystitis without obstruction: Secondary | ICD-10-CM | POA: Diagnosis not present

## 2018-05-22 DIAGNOSIS — R17 Unspecified jaundice: Secondary | ICD-10-CM

## 2018-05-22 DIAGNOSIS — K824 Cholesterolosis of gallbladder: Secondary | ICD-10-CM | POA: Diagnosis not present

## 2018-06-17 ENCOUNTER — Other Ambulatory Visit: Payer: Self-pay | Admitting: Adult Health

## 2018-06-20 DIAGNOSIS — E291 Testicular hypofunction: Secondary | ICD-10-CM | POA: Diagnosis not present

## 2018-06-26 DIAGNOSIS — E782 Mixed hyperlipidemia: Secondary | ICD-10-CM | POA: Diagnosis not present

## 2018-06-26 DIAGNOSIS — Z9861 Coronary angioplasty status: Secondary | ICD-10-CM | POA: Diagnosis not present

## 2018-06-26 DIAGNOSIS — I25119 Atherosclerotic heart disease of native coronary artery with unspecified angina pectoris: Secondary | ICD-10-CM | POA: Diagnosis not present

## 2018-06-26 DIAGNOSIS — I2510 Atherosclerotic heart disease of native coronary artery without angina pectoris: Secondary | ICD-10-CM | POA: Diagnosis not present

## 2018-06-26 LAB — COMPREHENSIVE METABOLIC PANEL
A/G RATIO: 2 (ref 1.2–2.2)
ALT: 19 IU/L (ref 0–44)
AST: 20 IU/L (ref 0–40)
Albumin: 4.3 g/dL (ref 3.6–4.8)
Alkaline Phosphatase: 48 IU/L (ref 39–117)
BUN/Creatinine Ratio: 13 (ref 10–24)
BUN: 16 mg/dL (ref 8–27)
Bilirubin Total: 1 mg/dL (ref 0.0–1.2)
CALCIUM: 9.1 mg/dL (ref 8.6–10.2)
CO2: 21 mmol/L (ref 20–29)
Chloride: 105 mmol/L (ref 96–106)
Creatinine, Ser: 1.21 mg/dL (ref 0.76–1.27)
GFR calc Af Amer: 71 mL/min/{1.73_m2} (ref >59)
GFR calc non Af Amer: 62 mL/min/{1.73_m2} (ref >59)
GLOBULIN, TOTAL: 2.2 g/dL (ref 1.5–4.5)
Glucose: 91 mg/dL (ref 65–99)
POTASSIUM: 3.8 mmol/L (ref 3.5–5.2)
SODIUM: 142 mmol/L (ref 134–144)
Total Protein: 6.5 g/dL (ref 6.0–8.5)

## 2018-06-26 LAB — LIPID PANEL
CHOL/HDL RATIO: 2.7 ratio (ref 0.0–5.0)
Cholesterol, Total: 111 mg/dL (ref 100–199)
HDL: 41 mg/dL (ref >39)
LDL Calculated: 55 mg/dL (ref 0–99)
TRIGLYCERIDES: 74 mg/dL (ref 0–149)
VLDL Cholesterol Cal: 15 mg/dL (ref 5–40)

## 2018-07-18 DIAGNOSIS — E291 Testicular hypofunction: Secondary | ICD-10-CM | POA: Diagnosis not present

## 2018-07-26 ENCOUNTER — Other Ambulatory Visit: Payer: Self-pay | Admitting: Cardiovascular Disease

## 2018-07-26 NOTE — Telephone Encounter (Signed)
Rx has been sent to the pharmacy electronically. ° °

## 2018-08-15 DIAGNOSIS — E291 Testicular hypofunction: Secondary | ICD-10-CM | POA: Diagnosis not present

## 2018-08-16 ENCOUNTER — Encounter: Payer: Self-pay | Admitting: Cardiovascular Disease

## 2018-08-16 ENCOUNTER — Ambulatory Visit (INDEPENDENT_AMBULATORY_CARE_PROVIDER_SITE_OTHER): Payer: PPO | Admitting: Cardiovascular Disease

## 2018-08-16 VITALS — BP 142/84 | HR 52 | Ht 69.5 in | Wt 214.2 lb

## 2018-08-16 DIAGNOSIS — I2510 Atherosclerotic heart disease of native coronary artery without angina pectoris: Secondary | ICD-10-CM | POA: Diagnosis not present

## 2018-08-16 DIAGNOSIS — Z79899 Other long term (current) drug therapy: Secondary | ICD-10-CM

## 2018-08-16 DIAGNOSIS — E785 Hyperlipidemia, unspecified: Secondary | ICD-10-CM | POA: Diagnosis not present

## 2018-08-16 DIAGNOSIS — Z9861 Coronary angioplasty status: Secondary | ICD-10-CM | POA: Diagnosis not present

## 2018-08-16 MED ORDER — NITROGLYCERIN 0.4 MG SL SUBL: 0.4000 mg | SUBLINGUAL_TABLET | SUBLINGUAL | 3 refills | Status: DC | PRN

## 2018-08-16 MED ORDER — ATORVASTATIN CALCIUM 40 MG PO TABS: 40.0000 mg | ORAL_TABLET | Freq: Every day | ORAL | 3 refills | Status: DC

## 2018-08-16 NOTE — Progress Notes (Signed)
Cardiology Office Note    Date:  08/16/2018   ID:  Ryan, Scott 1950/12/01, MRN [id number]  PCP:  [name], MD  Cardiologist:  Shelva Majestic, MD   F/U   History of Present Illness:  Ryan Scott is a 67 y.o. male who presents for a 66-monthfollow-up cardiology evaluation.  Ryan Scott remotely seen Dr. WRollene Fare  He was seen in March 2019 by KJory Sims NP referred by Dr. VHarlene Ramusafter his CT of his chest healed significant coronary calcification.  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  He had noticed more shortness of breath but denied any significant chest pain symptomatology.  With his symptoms and CT scan, he was referred for cardiac catheterization.  On December 19, 2017 cardiac catheterization performed by me showed low normal LV function with an EF of 50 to 55%.  There was significant coronary calcification 20 to 30% proximal LAD stenosis and then 80% stenosis after the first diagonal with 80% distal stenosis.  Circumflex had 60% focal OM1 stenosis prior to bifurcation and the RCA was significantly calcified with 50% stenosis in the proximal bend of the vessel and diffuse 70, 80, and 95% stenosis beyond the acute margin extending up to the takeoff of the PDA.  The PDA and PLA were collateralized via the distal circumflex.  He underwent a difficult but successful PCI to the calcified LAD with ultimate insertion of a 3.0 x 16 mm Synergy DES stent to the proximal lesion PTCA of the distal LAD.  Subsequently, he was on aspirin and Brilinta and had been on isosorbide 30 mg in addition to metoprolol 12.5 mg twice a day.  He also has been on Zetia 10 mg in addition to Livalo reportedly at 1 mg daily.  He has experienced shortness of breath really when he lies down.  He denies any significant volume issues.  He has noticed some slight improvement with dyspnea following coffee.  I saw him for initial evaluation following his  hospitalization in July 2019.  Due to potential Brilinta induced dyspnea he was switched to clopidogrel 75 mg daily.  Subsequent P2 Y 12 testing adjusted possible hyper responsiveness at 7 PR U.  Subsequently, his shortness of breath has improved.  He is unaware of any excess bleeding.  He continues to be active and is able to do anything he can without chest pain or shortness of breath.  1 month ago he underwent went repeat laboratory which showed a total cholesterol of 111 triglycerides 74 HDL 41 and LDL cholesterol 55 while on combination Zetia and Livalo.  He is concerned about the cost of Livalo.  He is unaware of any previous intolerance to other statins.  He presents for follow-up evaluation.   Past Medical History:  Diagnosis Date  . Arthritis    "back, neck, shoulders" (12/19/2017)  . [place]   . Borderline hypertension   . BPH (benign prostatic hyperplasia)   . Chronic lower back pain   . ED (erectile dysfunction)   . High cholesterol    "started BP RX 12/18/2017"  . History of adenomatous polyp of colon    tubular adenoma  . History of kidney stones   . Hypertension   . Hypogonadism male   . Left ureteral stone   . Lower urinary tract symptoms (LUTS)   . RBBB (right bundle branch block)     Past Surgical History:  Procedure Laterality  Date  . CARDIOVASCULAR STRESS TEST  10-16-2012   normal myocardial perfusion study,  no ischemia/  not gated  . COLONOSCOPY  last one 07-26-2013  . CORONARY STENT INTERVENTION N/A 12/19/2017   Procedure: CORONARY STENT INTERVENTION;  Surgeon: [name], MD;  Location: [facility];  Service: Cardiovascular;  Laterality: N/A;  . CYSTOSCOPY/RETROGRADE/URETEROSCOPY/STONE EXTRACTION WITH BASKET  1989  . LEFT HEART CATH AND CORONARY ANGIOGRAPHY N/A 12/19/2017   Procedure: LEFT HEART CATH AND CORONARY ANGIOGRAPHY;  Surgeon: [name], MD;  Location: [facility];  Service: Cardiovascular;  Laterality: N/A;  LAD     . PROSTATE SURGERY  2002  . PROSTATE SURGERY  2002  . REFRACTIVE SURGERY Bilateral   . SHOULDER ARTHROSCOPY WITH SUBACROMIAL DECOMPRESSION, ROTATOR CUFF REPAIR AND BICEP TENDON REPAIR Left 2013  . TONSILLECTOMY  ~ 1956  . TRANSTHORACIC ECHOCARDIOGRAM  10-16-2012   normal LVF, ef 55-65%,  mild AV sclerosis without stenosis,  mild MR and TR,  mild LAE,  trivial PR  . TRANSURETHRAL RESECTION OF BLADDER TUMOR WITH GYRUS (TURBT-GYRUS)  1989   and URETEROSCOPIC STONE EXTRACTION    Current Medications: Outpatient Medications Prior to Visit  Medication Sig Dispense Refill  . allopurinol (ZYLOPRIM) 100 MG tablet Take 100 mg by mouth daily.    Marland Kitchen aspirin 81 MG chewable tablet Chew 1 tablet (81 mg total) by mouth daily.    . clopidogrel (PLAVIX) 75 MG tablet Take 1 tablet (75 mg total) by mouth daily. 90 tablet 3  . ezetimibe (ZETIA) 10 MG tablet TAKE 1 TABLET BY MOUTH EVERY DAY 90 tablet 3  . isosorbide mononitrate (IMDUR) 30 MG 24 hr tablet Take 0.5 tablets (15 mg total) by mouth daily. 90 tablet 3  . metoprolol tartrate (LOPRESSOR) 25 MG tablet Take 0.5 tablets (12.5 mg total) by mouth 2 (two) times daily. 90 tablet 2  . testosterone cypionate (DEPOTESTOTERONE CYPIONATE) 200 MG/ML injection Inject 200 mg into the muscle every 28 (twenty-eight) days.     Marland Kitchen LIVALO 1 MG TABS TAKE 2 TABLETS (2 MG TOTAL) BY MOUTH DAILY. 90 tablet 1   No facility-administered medications prior to visit.      Allergies:   Patient has no known allergies.   Social History   Socioeconomic History  . Marital status: Married    Spouse name: Not on file  . Number of children: Not on file  . Years of education: Not on file  . Highest education level: Not on file  Occupational History  . Not on file  Social Needs  . Financial resource strain: Not on file  . Food insecurity:    Worry: Not on file    Inability: Not on file  . Transportation needs:    Medical: Not on file    Non-medical: Not on file  Tobacco Use   . Smoking status: Never Smoker  . Smokeless tobacco: Former Systems developer    Types: Chew  Substance and Sexual Activity  . Alcohol use: Yes    Alcohol/week: 2.0 standard drinks    Types: 2 Standard drinks or equivalent per week  . Drug use: Not Currently  . Sexual activity: Not on file  Lifestyle  . Physical activity:    Days per week: Not on file    Minutes per session: Not on file  . Stress: Not on file  Relationships  . Social connections:    Talks on phone: Not on file    Gets together: Not on file  Attends religious service: Not on file    Active member of club or organization: Not on file    Attends meetings of clubs or organizations: Not on file    Relationship status: Not on file  Other Topics Concern  . Not on file  Social History Narrative  . Not on file     Family History:  The patient's family history includes Aneurysm in his paternal grandfather; Arthritis in his father and sister; Breast cancer in his mother; Heart attack in his father; Heart disease in his father; Hypertension in his father; Lung cancer in his maternal grandfather; Other in his paternal grandmother; Stroke in his maternal grandmother; Thyroid disease in his mother.   ROS General: Negative; No fevers, chills, or night sweats;  HEENT: Negative; No changes in vision or hearing, sinus congestion, difficulty swallowing Pulmonary: Mild shortness of breath.  Possible silicosis, not definitively diagnosed Cardiovascular: Negative; No chest pain, presyncope, syncope, palpitations GI: Negative; No nausea, vomiting, diarrhea, or abdominal pain GU: Negative; No dysuria, hematuria, or difficulty voiding Musculoskeletal: Negative; no myalgias, joint pain, or weakness Hematologic/Oncology: Negative; no easy bruising, bleeding Endocrine: Negative; no heat/cold intolerance; no diabetes Neuro: Negative; no changes in balance, headaches Skin: Negative; No rashes or skin lesions Psychiatric: Negative; No behavioral  problems, depression Sleep: Negative; No snoring, daytime sleepiness, hypersomnolence, bruxism, restless legs, hypnogognic hallucinations, no cataplexy Other comprehensive 14 point system review is negative.   PHYSICAL EXAM:   VS:  BP (!) 142/84   Pulse (!) 52   Ht 5' 9.5" (1.765 m)   Wt 214 lb 3.2 oz (97.2 kg)   BMI 31.18 kg/m     Repeat blood pressure by me was 120/78  Wt Readings from Last 3 Encounters:  08/16/18 214 lb 3.2 oz (97.2 kg)  04/13/18 224 lb (101.6 kg)  01/31/18 236 lb (107 kg)   General: Alert, oriented, no distress.  Skin: normal turgor, no rashes, warm and dry HEENT: Normocephalic, atraumatic. Pupils equal round and reactive to light; sclera anicteric; extraocular muscles intact;  Nose without nasal septal hypertrophy Mouth/Parynx benign; Mallinpatti scale Neck: No JVD, no carotid bruits; normal carotid upstroke Lungs: clear to ausculatation and percussion; no wheezing or rales Chest wall: without tenderness to palpitation Heart: PMI not displaced, RRR, s1 s2 normal, 1/6 systolic murmur, no diastolic murmur, no rubs, gallops, thrills, or heaves Abdomen: soft, nontender; no hepatosplenomehaly, BS+; abdominal aorta nontender and not dilated by palpation. Back: no CVA tenderness Pulses 2+ Musculoskeletal: full range of motion, normal strength, no joint deformities Extremities: no clubbing cyanosis or edema, Homan's sign negative  Neurologic: grossly nonfocal; Cranial nerves grossly wnl Psychologic: Normal mood and affect   Studies/Labs Reviewed:   EKG:  EKG is  ordered today.  ECG (independently read by me): Sinus bradycardia 52 bpm.  First-degree AV block.  Right bundle branch block.  July 2019 ECG (independently read by me): Sinus bradycardia at 50 bpm.  Mild first-degree AV block with a PR interval of 218 ms.  Incomplete right bundle branch block.  Recent Labs: BMP Latest Ref Rng & Units 06/26/2018 12/20/2017 12/18/2017  Glucose 65 - 99 mg/dL 91 100(H) 81    BUN 8 - 27 mg/dL 16 15 12   Creatinine 0.76 - 1.27 mg/dL 1.21 1.42(H) 1.29(H)  BUN/Creat Ratio 10 - 24 13 - 9(L)  Sodium 134 - 144 mmol/L 142 136 139  Potassium 3.5 - 5.2 mmol/L 3.8 3.7 4.4  Chloride 96 - 106 mmol/L 105 103 102  CO2 20 -  29 mmol/L 21 25 24   Calcium 8.6 - 10.2 mg/dL 9.1 8.3(L) 9.4     Hepatic Function Latest Ref Rng & Units 06/26/2018 04/02/2018 05/16/2013  Total Protein 6.0 - 8.5 g/dL 6.5 7.1 6.9  Albumin 3.6 - 4.8 g/dL 4.3 4.4 4.3  AST 0 - 40 IU/L 20 21 19   ALT 0 - 44 IU/L 19 24 20   Alk Phosphatase 39 - 117 IU/L 48 51 44  Total Bilirubin 0.0 - 1.2 mg/dL 1.0 1.2 1.6(H)  Bilirubin, Direct 0.00 - 0.40 mg/dL - 0.27 -    CBC Latest Ref Rng & Units 12/20/2017 12/18/2017 05/16/2013  WBC 4.0 - 10.5 K/uL 8.8 6.9 6.9  Hemoglobin 13.0 - 17.0 g/dL 14.8 17.0 17.2(H)  Hematocrit 39.0 - 52.0 % 44.7 49.8 49.9  Platelets 150 - 400 K/uL 190 236 231   Lab Results  Component Value Date   MCV 90.1 12/20/2017   MCV 89 12/18/2017   MCV 87.1 05/16/2013   No results found for: TSH No results found for: HGBA1C   BNP No results found for: BNP  ProBNP No results found for: PROBNP   Lipid Panel     Component Value Date/Time   CHOL 111 06/26/2018 0938   TRIG 74 06/26/2018 0938   HDL 41 06/26/2018 0938   CHOLHDL 2.7 06/26/2018 0938   LDLCALC 55 06/26/2018 0938     RADIOLOGY: No results found.   Additional studies/ records that were reviewed today include:   CT CHEST: IMPRESSION: 1. Two right middle lobe pulmonary nodules are unchanged since 2016 and benign. No additional imaging follow-up is necessary. 2. No acute pulmonary findings. Mild bilateral middle lobe and lower lobe scarring/atelectasis. 3. Extensive coronary artery calcified atherosclerosis and/or stents. ------------------------------------------------------------------------------------------------------  December 19, 2017 cardiac catheterization/PCI:  Low normal global LV function with an EF of 50-55%  without definitive focal segmental wall motion abnormalities.  Significant coronary calcification with vessel CAD.  The LAD had proximal 20-30% stenosis and a calcified segment and then following a sharp and had 80% stenosis after the first diagonal vessel and 80% distal stenosis;  60% focal OM1 stenosis prior to a bifurcation; calcified RCA with possible 50% stenosis on a proximal bend in the vessel.  There is diffuse 70-80-95% stenoses  beyond the acute margin extending up to the takeoff of the PDA.  There is collateralization of the PDA and PLA vessels via the distal circumflex.  Difficult but successful PCI to the calcified LAD with ultimate insertion of a 3.016 mm Synergy DES stent to the proximal lesion with the 80% stenosis being reduced to 0%, and PTCA of the distal LAD lesion with the 80% stenosis being reduced to less than 5% utilizing bivalirudin and aspirin/Brilinta.  RECOMMENDATION: DAPT for a mininum of one year. Following this initial procedure he will be started on anti-ischemic medical therapy including beta blocker and nitrates.  Will review angiograms with colleagues.  The distal RCA is collateralized.  Consider attempt at initial medical therapy of the diffusely diseased distal calcified RCA versus staged  PCI.   ASSESSMENT:    1. Coronary artery disease involving native coronary artery of native heart without angina pectoris   2. CAD S/P percutaneous coronary intervention   3. Medication management   4. Hyperlipidemia with target LDL less than 70      PLAN:  Ryan Scott is a 67 year old gentleman who has a history of mild hypertension, hyperlipidemia, and has documented stable pulmonary nodules and extensive coronary calcification.  He was  found to have multivessel CAD as noted above and underwent successful stenting of a calcified proximal LAD stenosis with PTCA of the distal LAD stenosis in March 2019.  At that time he had diffuse disease in his RCA which was  significantly narrowed distally but there was good collateralization to the distal RCA from the circumflex territory.  Since his coronary intervention he has done well.  He denies any episodes of chest tightness but prior to his catheterization he had never experienced chest pressure before.  Shortness of breath has resolved may have been contributed by Brilinta which can occur and 14% of patients.  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  He has remained active.  Due to his significant concomitant CAD he has been continued on low-dose isosorbide in addition to metoprolol.  His blood pressure today on repeat testing by me was stable on this therapy.  He is concerned about the cost of Livalo.  Upon further questioning it does not appear that he really had any previous statin intolerance.  He had been placed on Livalo prior to his cardiac catheterization.  At this point, I will discontinue Livalo and in its place will initiate atorvastatin 40 mg.  In 3 to 4 months he will undergo repeat laboratory.  At present he will continue Zetia.  His most recent lipid studies were excellent with an LDL cholesterol at 55.  Hopefully LDL can be further reduced which may be more likely to induce plaque regression.  He questioned about use of Viagra, since he is on nitrates this is not recommended.  If he really remains symptom-free when I see him at his next visit it may be possible to discontinue his low-dose isosorbide.  I I will see him in April 2020 for follow-up evaluation.  I renewed his prescription for sublingual nitroglycerin.    Medication Adjustments/Labs and Tests Ordered: Current medicines are reviewed at length with the patient today.  Concerns regarding medicines are outlined above.  Medication changes, Labs and Tests ordered today are listed in the Patient Instructions below. Patient Instructions  Medication Instructions:  STOP Livalo START  atorvastatin (Lipitor) 40 mg daily  If you need a refill on your cardiac medications before your next appointment, please call your pharmacy.   Lab work: Please return for FASTING labs prior to appointment in April (CMET, Lipid)  Our in office lab hours are Monday-Friday 8:00-4:00, closed for lunch 12:45-1:45 pm.  No appointment needed.  If you have labs (blood work) drawn today and your tests are completely normal, you will receive your results only by: Marland Kitchen MyChart Message (if you have MyChart) OR . A paper copy in the mail If you have any lab test that is abnormal or we need to change your treatment, we will call you to review the results.  Follow-Up: At [facility], you and your health needs are our priority.  As part of our continuing mission to provide you with exceptional heart care, we have created designated Provider Care Teams.  These Care Teams include your primary Cardiologist (physician) and Advanced Practice Providers (APPs -  Physician Assistants and Nurse Practitioners) who all work together to provide you with the care you need, when you need it. You will need a follow up appointment in 5 months.  Please call our office 2 months in advance to schedule this appointment.  You may see Shelva Majestic, MD or one of the following Advanced Practice  Providers on your designated Care Team: Almyra Deforest, [place] . Fabian Sharp, PA-C       Signed, Shelva Majestic, MD  08/16/2018 8:33 AM    [facility] [address], [place], [place], [place]  [address] Phone: [phone]

## 2018-08-16 NOTE — Patient Instructions (Signed)
Medication Instructions:  STOP Livalo START atorvastatin (Lipitor) 40 mg daily  If you need a refill on your cardiac medications before your next appointment, please call your pharmacy.   Lab work: Please return for FASTING labs prior to appointment in April (CMET, Lipid)  Our in office lab hours are Monday-Friday 8:00-4:00, closed for lunch 12:45-1:45 pm.  No appointment needed.  If you have labs (blood work) drawn today and your tests are completely normal, you will receive your results only by: Marland Kitchen MyChart Message (if you have MyChart) OR . A paper copy in the mail If you have any lab test that is abnormal or we need to change your treatment, we will call you to review the results.  Follow-Up: At [facility], you and your health needs are our priority.  As part of our continuing mission to provide you with exceptional heart care, we have created designated Provider Care Teams.  These Care Teams include your primary Cardiologist (physician) and Advanced Practice Providers (APPs -  Physician Assistants and Nurse Practitioners) who all work together to provide you with the care you need, when you need it. You will need a follow up appointment in 5 months.  Please call our office 2 months in advance to schedule this appointment.  You may see Shelva Majestic, MD or one of the following Advanced Practice Providers on your designated Care Team: [place], [place] . Fabian Sharp, PA-C

## 2018-09-08 ENCOUNTER — Other Ambulatory Visit (HOSPITAL_COMMUNITY): Payer: Self-pay | Admitting: Physician Assistant

## 2018-09-17 DIAGNOSIS — E291 Testicular hypofunction: Secondary | ICD-10-CM | POA: Diagnosis not present

## 2018-09-28 ENCOUNTER — Telehealth: Payer: Self-pay | Admitting: Cardiovascular Disease

## 2018-09-28 NOTE — Telephone Encounter (Signed)
Called patient, advised that the last OV states to have fasting labs done before the appointment in April.  Left a call back number if questions.

## 2018-09-28 NOTE — Telephone Encounter (Signed)
New Message:      Does pt need lab work before his appt in April with Dr Claiborne Billings?

## 2018-09-30 DIAGNOSIS — M25571 Pain in right ankle and joints of right foot: Secondary | ICD-10-CM | POA: Diagnosis not present

## 2018-10-22 DIAGNOSIS — E291 Testicular hypofunction: Secondary | ICD-10-CM | POA: Diagnosis not present

## 2018-11-17 ENCOUNTER — Encounter: Payer: Self-pay | Admitting: Gastroenterology

## 2018-11-19 DIAGNOSIS — E291 Testicular hypofunction: Secondary | ICD-10-CM | POA: Diagnosis not present

## 2018-11-21 DIAGNOSIS — N183 Chronic kidney disease, stage 3 (moderate): Secondary | ICD-10-CM | POA: Diagnosis not present

## 2018-11-21 DIAGNOSIS — E7800 Pure hypercholesterolemia, unspecified: Secondary | ICD-10-CM | POA: Diagnosis not present

## 2018-11-21 DIAGNOSIS — Z1159 Encounter for screening for other viral diseases: Secondary | ICD-10-CM | POA: Diagnosis not present

## 2018-11-21 DIAGNOSIS — Z0000 Encounter for general adult medical examination without abnormal findings: Secondary | ICD-10-CM | POA: Diagnosis not present

## 2018-11-21 DIAGNOSIS — N400 Enlarged prostate without lower urinary tract symptoms: Secondary | ICD-10-CM | POA: Diagnosis not present

## 2018-11-21 DIAGNOSIS — I10 Essential (primary) hypertension: Secondary | ICD-10-CM | POA: Diagnosis not present

## 2018-11-21 DIAGNOSIS — N5201 Erectile dysfunction due to arterial insufficiency: Secondary | ICD-10-CM | POA: Diagnosis not present

## 2018-11-21 DIAGNOSIS — I2510 Atherosclerotic heart disease of native coronary artery without angina pectoris: Secondary | ICD-10-CM | POA: Diagnosis not present

## 2019-01-14 ENCOUNTER — Ambulatory Visit: Payer: PPO | Admitting: Cardiovascular Disease

## 2019-01-15 DIAGNOSIS — E291 Testicular hypofunction: Secondary | ICD-10-CM | POA: Diagnosis not present

## 2019-02-13 DIAGNOSIS — E291 Testicular hypofunction: Secondary | ICD-10-CM | POA: Diagnosis not present

## 2019-03-07 ENCOUNTER — Other Ambulatory Visit: Payer: Self-pay

## 2019-03-07 MED ORDER — EZETIMIBE 10 MG PO TABS: 10.0000 mg | ORAL_TABLET | Freq: Every day | ORAL | 1 refills | Status: DC

## 2019-03-13 DIAGNOSIS — E291 Testicular hypofunction: Secondary | ICD-10-CM | POA: Diagnosis not present

## 2019-04-12 ENCOUNTER — Other Ambulatory Visit: Payer: Self-pay | Admitting: Cardiovascular Disease

## 2019-04-15 DIAGNOSIS — E291 Testicular hypofunction: Secondary | ICD-10-CM | POA: Diagnosis not present

## 2019-04-22 ENCOUNTER — Other Ambulatory Visit: Payer: Self-pay | Admitting: Cardiovascular Disease

## 2019-05-02 ENCOUNTER — Encounter

## 2019-05-07 DIAGNOSIS — E291 Testicular hypofunction: Secondary | ICD-10-CM | POA: Diagnosis not present

## 2019-05-07 DIAGNOSIS — R948 Abnormal results of function studies of other organs and systems: Secondary | ICD-10-CM | POA: Diagnosis not present

## 2019-05-13 ENCOUNTER — Telehealth: Payer: Self-pay | Admitting: Cardiovascular Disease

## 2019-05-13 NOTE — Telephone Encounter (Signed)
Spoke with pt, lab slip for lipid and cmet mailed to the patient.

## 2019-05-13 NOTE — Telephone Encounter (Signed)
New Message:    Pt is scheduled to see Dr Claiborne Billings on 06-24-19. He wants too know when can he come in for lab work?

## 2019-05-15 DIAGNOSIS — N400 Enlarged prostate without lower urinary tract symptoms: Secondary | ICD-10-CM | POA: Diagnosis not present

## 2019-05-15 DIAGNOSIS — N200 Calculus of kidney: Secondary | ICD-10-CM | POA: Diagnosis not present

## 2019-05-15 DIAGNOSIS — E291 Testicular hypofunction: Secondary | ICD-10-CM | POA: Diagnosis not present

## 2019-05-17 DIAGNOSIS — Z79899 Other long term (current) drug therapy: Secondary | ICD-10-CM | POA: Diagnosis not present

## 2019-05-17 DIAGNOSIS — I2510 Atherosclerotic heart disease of native coronary artery without angina pectoris: Secondary | ICD-10-CM | POA: Diagnosis not present

## 2019-05-17 DIAGNOSIS — E782 Mixed hyperlipidemia: Secondary | ICD-10-CM | POA: Diagnosis not present

## 2019-05-17 LAB — COMPREHENSIVE METABOLIC PANEL
ALT: 30 IU/L (ref 0–44)
AST: 25 IU/L (ref 0–40)
Albumin/Globulin Ratio: 2 (ref 1.2–2.2)
Albumin: 4.5 g/dL (ref 3.8–4.8)
Alkaline Phosphatase: 56 IU/L (ref 39–117)
BUN/Creatinine Ratio: 17 (ref 10–24)
BUN: 22 mg/dL (ref 8–27)
Bilirubin Total: 2.1 mg/dL — ABNORMAL HIGH (ref 0.0–1.2)
CO2: 21 mmol/L (ref 20–29)
Calcium: 9.4 mg/dL (ref 8.6–10.2)
Chloride: 102 mmol/L (ref 96–106)
Creatinine, Ser: 1.26 mg/dL (ref 0.76–1.27)
GFR calc Af Amer: 67 mL/min/{1.73_m2} (ref >59)
GFR calc non Af Amer: 58 mL/min/{1.73_m2} — ABNORMAL LOW (ref >59)
Globulin, Total: 2.2 g/dL (ref 1.5–4.5)
Glucose: 91 mg/dL (ref 65–99)
Potassium: 4.4 mmol/L (ref 3.5–5.2)
Sodium: 139 mmol/L (ref 134–144)
Total Protein: 6.7 g/dL (ref 6.0–8.5)

## 2019-05-17 LAB — LIPID PANEL
Chol/HDL Ratio: 2.5 ratio (ref 0.0–5.0)
Cholesterol, Total: 99 mg/dL — ABNORMAL LOW (ref 100–199)
HDL: 39 mg/dL — ABNORMAL LOW (ref >39)
LDL Calculated: 45 mg/dL (ref 0–99)
Triglycerides: 75 mg/dL (ref 0–149)
VLDL Cholesterol Cal: 15 mg/dL (ref 5–40)

## 2019-05-24 ENCOUNTER — Ambulatory Visit (INDEPENDENT_AMBULATORY_CARE_PROVIDER_SITE_OTHER): Payer: PPO | Admitting: Cardiovascular Disease

## 2019-05-24 ENCOUNTER — Encounter: Payer: Self-pay | Admitting: Cardiovascular Disease

## 2019-05-24 ENCOUNTER — Other Ambulatory Visit: Payer: Self-pay

## 2019-05-24 VITALS — BP 109/69 | HR 56 | Ht 69.5 in | Wt 230.6 lb

## 2019-05-24 DIAGNOSIS — Z9861 Coronary angioplasty status: Secondary | ICD-10-CM | POA: Diagnosis not present

## 2019-05-24 DIAGNOSIS — E785 Hyperlipidemia, unspecified: Secondary | ICD-10-CM

## 2019-05-24 DIAGNOSIS — E669 Obesity, unspecified: Secondary | ICD-10-CM

## 2019-05-24 DIAGNOSIS — I10 Essential (primary) hypertension: Secondary | ICD-10-CM

## 2019-05-24 DIAGNOSIS — I2510 Atherosclerotic heart disease of native coronary artery without angina pectoris: Secondary | ICD-10-CM

## 2019-05-24 NOTE — Patient Instructions (Signed)
Follow-Up: You will need a follow up appointment in 12 months.  Please call our office 3 months in advance, MAY 2021 to schedule this, AUGUST 2021 appointment.  You may see Shelva Majestic, MD or one of the following Advanced Practice Providers on your designated Care Team: Almyra Deforest, PA-C Fabian Sharp, [place]       Medication Instructions:  The current medical regimen is effective;  continue present plan and medications as directed. Please refer to the Current Medication list given to you today. If you need a refill on your cardiac medications before your next appointment, please call your pharmacy. Labwork: When you have labs (blood work) and your tests are completely normal, you will receive your results ONLY by [place] (if you have MyChart) -OR- A paper copy in the mail.  At [facility], you and your health needs are our priority.  As part of our continuing mission to provide you with exceptional heart care, we have created designated Provider Care Teams.  These Care Teams include your primary Cardiologist (physician) and Advanced Practice Providers (APPs -  Physician Assistants and Nurse Practitioners) who all work together to provide you with the care you need, when you need it.  Thank you for choosing [facility] at [facility]!!

## 2019-05-24 NOTE — Progress Notes (Signed)
Cardiology Office Note    Date:  05/24/2019   ID:  Riyad, Keena 21-Feb-1951, MRN [id number]  PCP:  [name], MD  Cardiologist:  Shelva Majestic, MD   F/U   History of Present Illness:  JAYMIE MISCH is a 68 y.o. male who presents for a 10 month follow-up cardiology evaluation.  Mr. Yohe had remotely seen Dr. Rollene Fare.  He was seen in March 2019 by Jory Sims, NP referred by Dr. Harlene Ramus after his CT of his chest healed significant coronary calcification.  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  He had noticed more shortness of breath but denied any significant chest pain symptomatology.  With his symptoms and CT scan, he was referred for cardiac catheterization.  On December 19, 2017 cardiac catheterization performed by me showed low normal LV function with an EF of 50 to 55%.  There was significant coronary calcification 20 to 30% proximal LAD stenosis and then 80% stenosis after the first diagonal with 80% distal stenosis.  Circumflex had 60% focal OM1 stenosis prior to bifurcation and the RCA was significantly calcified with 50% stenosis in the proximal bend of the vessel and diffuse 70, 80, and 95% stenosis beyond the acute margin extending up to the takeoff of the PDA.  The PDA and PLA were collateralized via the distal circumflex.  He underwent a difficult but successful PCI to the calcified LAD with ultimate insertion of a 3.0 x 16 mm Synergy DES stent to the proximal lesion PTCA of the distal LAD.  Subsequently, he was on aspirin and Brilinta and had been on isosorbide 30 mg in addition to metoprolol 12.5 mg twice a day.  He also has been on Zetia 10 mg in addition to Livalo reportedly at 1 mg daily.  He has experienced shortness of breath really when he lies down.  He denies any significant volume issues.  He has noticed some slight improvement with dyspnea following coffee.  I saw him for initial evaluation following his  hospitalization in July 2019.  Due to potential Brilinta induced dyspnea he was switched to clopidogrel 75 mg daily.  Subsequent P2 Y12 testing adjusted possible hyper responsiveness at 7 PR U.  Subsequently, his shortness of breath has improved.   I last saw him in November 2019.  He was unaware of any excess bleeding.  He continues to be active and is able to do anything he can without chest pain or shortness of breath.  He underwent went repeat laboratory which showed a total cholesterol of 111 triglycerides 74 HDL 41 and LDL cholesterol 55 while on combination Zetia and Livalo.  He was concerned about the cost of Livalo and since he was unaware of any issues with other statins I recommended changing to atorvastatin.   Since I last saw him in November, he has continued to remain stable.  He denies any recurrent anginal symptomatology.  Previously had lost up to 25 pounds and when I saw him in November his weight was 214.  Since that time due to the [place] pandemic he has not been exercising as much as he had in his past and he has gained approximately 16 pounds.  He continues to walk 2-1/2 miles per day.  He is retired Horticulturist, commercial.  He denies chest pain PND orthopnea.  He is unaware of palpitations.  There is no presyncope.  He presents for evaluation   Past Medical History:  Diagnosis  Date  . Arthritis    "back, neck, shoulders" (12/19/2017)  . Bladder cancer ([place])   . Borderline hypertension   . BPH (benign prostatic hyperplasia)   . Chronic lower back pain   . ED (erectile dysfunction)   . High cholesterol    "started BP RX 12/18/2017"  . History of adenomatous polyp of colon    tubular adenoma  . History of kidney stones   . Hypertension   . Hypogonadism male   . Left ureteral stone   . Lower urinary tract symptoms (LUTS)   . RBBB (right bundle branch block)     Past Surgical History:  Procedure Laterality Date  . CARDIOVASCULAR STRESS TEST  10-16-2012   normal myocardial perfusion  study,  no ischemia/  not gated  . COLONOSCOPY  last one 07-26-2013  . CORONARY STENT INTERVENTION N/A 12/19/2017   Procedure: CORONARY STENT INTERVENTION;  Surgeon: [name], MD;  Location: [facility];  Service: Cardiovascular;  Laterality: N/A;  . CYSTOSCOPY/RETROGRADE/URETEROSCOPY/STONE EXTRACTION WITH BASKET  1989  . LEFT HEART CATH AND CORONARY ANGIOGRAPHY N/A 12/19/2017   Procedure: LEFT HEART CATH AND CORONARY ANGIOGRAPHY;  Surgeon: [name], MD;  Location: [facility];  Service: Cardiovascular;  Laterality: N/A;  LAD   . PROSTATE SURGERY  2002  . PROSTATE SURGERY  2002  . REFRACTIVE SURGERY Bilateral   . SHOULDER ARTHROSCOPY WITH SUBACROMIAL DECOMPRESSION, ROTATOR CUFF REPAIR AND BICEP TENDON REPAIR Left 2013  . TONSILLECTOMY  ~ 1956  . TRANSTHORACIC ECHOCARDIOGRAM  10-16-2012   normal LVF, ef 55-65%,  mild AV sclerosis without stenosis,  mild MR and TR,  mild LAE,  trivial PR  . TRANSURETHRAL RESECTION OF BLADDER TUMOR WITH GYRUS (TURBT-GYRUS)  1989   and URETEROSCOPIC STONE EXTRACTION    Current Medications: Outpatient Medications Prior to Visit  Medication Sig Dispense Refill  . allopurinol (ZYLOPRIM) 100 MG tablet Take 100 mg by mouth daily.    Marland Kitchen aspirin 81 MG chewable tablet Chew 1 tablet (81 mg total) by mouth daily.    Marland Kitchen atorvastatin (LIPITOR) 40 MG tablet Take 1 tablet (40 mg total) by mouth daily. 90 tablet 3  . clopidogrel (PLAVIX) 75 MG tablet TAKE 1 TABLET BY MOUTH EVERY DAY 90 tablet 0  . ezetimibe (ZETIA) 10 MG tablet Take 1 tablet (10 mg total) by mouth daily. 90 tablet 1  . isosorbide mononitrate (IMDUR) 30 MG 24 hr tablet Take 0.5 tablets (15 mg total) by mouth daily. 90 tablet 3  . metoprolol tartrate (LOPRESSOR) 25 MG tablet TAKE 1/2 TABLET (12.5 MG TOTAL) BY MOUTH 2 (TWO) TIMES DAILY. 90 tablet 2  . nitroGLYCERIN (NITROSTAT) 0.4 MG SL tablet Place 1 tablet (0.4 mg total) under the tongue every 5 (five) minutes as needed for chest  pain. 25 tablet 3  . testosterone cypionate (DEPOTESTOTERONE CYPIONATE) 200 MG/ML injection Inject 200 mg into the muscle every 28 (twenty-eight) days.      No facility-administered medications prior to visit.      Allergies:   Patient has no known allergies.   Social History   Socioeconomic History  . Marital status: Married    Spouse name: Not on file  . Number of children: Not on file  . Years of education: Not on file  . Highest education level: Not on file  Occupational History  . Not on file  Social Needs  . Financial resource strain: Not on file  . Food insecurity    Worry:  Not on file    Inability: Not on file  . Transportation needs    Medical: Not on file    Non-medical: Not on file  Tobacco Use  . Smoking status: Never Smoker  . Smokeless tobacco: Former Systems developer    Types: Chew  Substance and Sexual Activity  . Alcohol use: Yes    Alcohol/week: 2.0 standard drinks    Types: 2 Standard drinks or equivalent per week  . Drug use: Not Currently  . Sexual activity: Not on file  Lifestyle  . Physical activity    Days per week: Not on file    Minutes per session: Not on file  . Stress: Not on file  Relationships  . Social Herbalist on phone: Not on file    Gets together: Not on file    Attends religious service: Not on file    Active member of club or organization: Not on file    Attends meetings of clubs or organizations: Not on file    Relationship status: Not on file  Other Topics Concern  . Not on file  Social History Narrative  . Not on file     Family History:  The patient's family history includes Aneurysm in his paternal grandfather; Arthritis in his father and sister; Breast cancer in his mother; Heart attack in his father; Heart disease in his father; Hypertension in his father; Lung cancer in his maternal grandfather; Other in his paternal grandmother; Stroke in his maternal grandmother; Thyroid disease in his mother.   ROS General:  Negative; No fevers, chills, or night sweats;  HEENT: Negative; No changes in vision or hearing, sinus congestion, difficulty swallowing Pulmonary: Mild shortness of breath.  Possible silicosis, not definitively diagnosed Cardiovascular: Negative; No chest pain, presyncope, syncope, palpitations GI: Negative; No nausea, vomiting, diarrhea, or abdominal pain GU: Negative; No dysuria, hematuria, or difficulty voiding Musculoskeletal: Negative; no myalgias, joint pain, or weakness Hematologic/Oncology: Negative; no easy bruising, bleeding Endocrine: Negative; no heat/cold intolerance; no diabetes Neuro: Negative; no changes in balance, headaches Skin: Negative; No rashes or skin lesions Psychiatric: Negative; No behavioral problems, depression Sleep: Negative; No snoring, daytime sleepiness, hypersomnolence, bruxism, restless legs, hypnogognic hallucinations, no cataplexy Other comprehensive 14 point system review is negative.   PHYSICAL EXAM:   VS:  BP 109/69   Pulse (!) 56   Ht 5' 9.5" (1.765 m)   Wt 230 lb 9.6 oz (104.6 kg)   SpO2 94%   BMI 33.57 kg/m     Repeat blood pressure by me was 110/78  Wt Readings from Last 3 Encounters:  05/24/19 230 lb 9.6 oz (104.6 kg)  08/16/18 214 lb 3.2 oz (97.2 kg)  04/13/18 224 lb (101.6 kg)   General: Alert, oriented, no distress.  Skin: normal turgor, no rashes, warm and dry HEENT: Normocephalic, atraumatic. Pupils equal round and reactive to light; sclera anicteric; extraocular muscles intact;  Nose without nasal septal hypertrophy Mouth/Parynx benign; Mallinpatti scale 3 Neck: No JVD, no carotid bruits; normal carotid upstroke Lungs: clear to ausculatation and percussion; no wheezing or rales Chest wall: without tenderness to palpitation Heart: PMI not displaced, RRR, s1 s2 normal, 1/6 systolic murmur, no diastolic murmur, no rubs, gallops, thrills, or heaves Abdomen: Diastases recti soft, nontender; no hepatosplenomehaly, BS+; abdominal  aorta nontender and not dilated by palpation. Back: no CVA tenderness Pulses 2+ Musculoskeletal: full range of motion, normal strength, no joint deformities Extremities: no clubbing cyanosis or edema, Homan's sign negative  Neurologic: grossly  nonfocal; Cranial nerves grossly wnl Psychologic: Normal mood and affect    Studies/Labs Reviewed:   EKG:  EKG is  ordered today.  ECG (independently read by me): Sinus bradycardia 56 bpm, incomplete right bundle branch block.  Borderline first-degree AV block with a PR interval 2 8 ms.  Small inferior Q waves  November 2019 ECG (independently read by me): Sinus bradycardia 52 bpm.  First-degree AV block.  Right bundle branch block.  July 2019 ECG (independently read by me): Sinus bradycardia at 50 bpm.  Mild first-degree AV block with a PR interval of 218 ms.  Incomplete right bundle branch block.  Recent Labs: BMP Latest Ref Rng & Units 05/17/2019 06/26/2018 12/20/2017  Glucose 65 - 99 mg/dL 91 91 100(H)  BUN 8 - 27 mg/dL 22 16 15   Creatinine 0.76 - 1.27 mg/dL 1.26 1.21 1.42(H)  BUN/Creat Ratio 10 - 24 17 13  -  Sodium 134 - 144 mmol/L 139 142 136  Potassium 3.5 - 5.2 mmol/L 4.4 3.8 3.7  Chloride 96 - 106 mmol/L 102 105 103  CO2 20 - 29 mmol/L 21 21 25   Calcium 8.6 - 10.2 mg/dL 9.4 9.1 8.3(L)     Hepatic Function Latest Ref Rng & Units 05/17/2019 06/26/2018 04/02/2018  Total Protein 6.0 - 8.5 g/dL 6.7 6.5 7.1  Albumin 3.8 - 4.8 g/dL 4.5 4.3 4.4  AST 0 - 40 IU/L 25 20 21   ALT 0 - 44 IU/L 30 19 24   Alk Phosphatase 39 - 117 IU/L 56 48 51  Total Bilirubin 0.0 - 1.2 mg/dL 2.1(H) 1.0 1.2  Bilirubin, Direct 0.00 - 0.40 mg/dL - - 0.27    CBC Latest Ref Rng & Units 12/20/2017 12/18/2017 05/16/2013  WBC 4.0 - 10.5 K/uL 8.8 6.9 6.9  Hemoglobin 13.0 - 17.0 g/dL 14.8 17.0 17.2(H)  Hematocrit 39.0 - 52.0 % 44.7 49.8 49.9  Platelets 150 - 400 K/uL 190 236 231   Lab Results  Component Value Date   MCV 90.1 12/20/2017   MCV 89 12/18/2017   MCV 87.1  05/16/2013   No results found for: TSH No results found for: HGBA1C   BNP No results found for: BNP  ProBNP No results found for: PROBNP   Lipid Panel     Component Value Date/Time   CHOL 99 (L) 05/17/2019 0845   TRIG 75 05/17/2019 0845   HDL 39 (L) 05/17/2019 0845   CHOLHDL 2.5 05/17/2019 0845   LDLCALC 45 05/17/2019 0845     RADIOLOGY: No results found.   Additional studies/ records that were reviewed today include:   CT CHEST: IMPRESSION: 1. Two right middle lobe pulmonary nodules are unchanged since 2016 and benign. No additional imaging follow-up is necessary. 2. No acute pulmonary findings. Mild bilateral middle lobe and lower lobe scarring/atelectasis. 3. Extensive coronary artery calcified atherosclerosis and/or stents. ------------------------------------------------------------------------------------------------------  December 19, 2017 cardiac catheterization/PCI:  Low normal global LV function with an EF of 50-55% without definitive focal segmental wall motion abnormalities.  Significant coronary calcification with vessel CAD.  The LAD had proximal 20-30% stenosis and a calcified segment and then following a sharp and had 80% stenosis after the first diagonal vessel and 80% distal stenosis;  60% focal OM1 stenosis prior to a bifurcation; calcified RCA with possible 50% stenosis on a proximal bend in the vessel.  There is diffuse 70-80-95% stenoses  beyond the acute margin extending up to the takeoff of the PDA.  There is collateralization of the PDA and PLA vessels via  the distal circumflex.  Difficult but successful PCI to the calcified LAD with ultimate insertion of a 3.016 mm Synergy DES stent to the proximal lesion with the 80% stenosis being reduced to 0%, and PTCA of the distal LAD lesion with the 80% stenosis being reduced to less than 5% utilizing bivalirudin and aspirin/Brilinta.  RECOMMENDATION: DAPT for a mininum of one year. Following this  initial procedure he will be started on anti-ischemic medical therapy including beta blocker and nitrates.  Will review angiograms with colleagues.  The distal RCA is collateralized.  Consider attempt at initial medical therapy of the diffusely diseased distal calcified RCA versus staged  PCI.   ASSESSMENT:    1. Coronary artery disease involving native coronary artery of native heart without angina pectoris   2. CAD S/P percutaneous coronary intervention   3. Essential hypertension   4. Hyperlipidemia with target LDL less than 70   5. Mild obesity      PLAN:  Mr. Nayan Proch is a 68 year old gentleman who has a history of mild hypertension, hyperlipidemia, and has documented stable pulmonary nodules and extensive coronary calcification.  He was found to have multivessel CAD as noted above and underwent successful stenting of a calcified proximal LAD stenosis with PTCA of the distal LAD stenosis in March 2019.  At that time he had diffuse disease in his RCA which was significantly narrowed distally but there was good collateralization to the distal RCA from the circumflex territory.  Since his coronary intervention he has done well.  He denies any episodes of chest tightness but prior to his catheterization he had never experienced chest pressure before.  He had developed dyspnea related to Brilinta and he is on Plavix therapy.  A P2Y12 test suggest possible hyper responsiveness with the*17 allele.  He is tolerating Plavix and denies any bleeding he has remained active.  Due to his significant concomitant CAD he has been continued on low-dose isosorbide in addition to metoprolol.  His blood pressure today on repeat testing by me was stable on this therapy.  Since I last saw him he continues to be angina free on low-dose isosorbide at 15 mg in addition to metoprolol 12.5 mg twice a day.  He continues to be on aspirin and Plavix and is tolerating this well without bleeding.  With reference to  hyperlipidemia he is now on atorvastatin 40 mg in addition to Zetia 10 mg.  Most recent laboratory in May 17, 2019 was excellent with total cholesterol 99 HDL 39 LDL 45 and triglycerides 75.  Hopefully at this level of LDL he will have not only plaque stability but regression of some plaque.  BMI is increased at 33.57 and he has gained 16 pounds since his last evaluation.  We discussed continued exercise and diet.  His ECG remained stable and shows sinus bradycardia 56 on low-dose beta-blocker therapy with incomplete right bundle branch block.  He sees Dr. Harlene Ramus for primary care and will be seeing her in February.  As long as he is stable I will see him in 1 year for follow-up cardiology evaluation.   Medication Adjustments/Labs and Tests Ordered: Current medicines are reviewed at length with the patient today.  Concerns regarding medicines are outlined above.  Medication changes, Labs and Tests ordered today are listed in the Patient Instructions below. Patient Instructions  Follow-Up: You will need a follow up appointment in 12 months.  Please call our office 3 months in advance, MAY 2021 to schedule this, AUGUST 2021  appointment.  You may see Shelva Majestic, MD or one of the following Advanced Practice Providers on your designated Care Team: Almyra Deforest, PA-C Fabian Sharp, [place]       Medication Instructions:  The current medical regimen is effective;  continue present plan and medications as directed. Please refer to the Current Medication list given to you today. If you need a refill on your cardiac medications before your next appointment, please call your pharmacy. Labwork: When you have labs (blood work) and your tests are completely normal, you will receive your results ONLY by [facility] (if you have MyChart) -OR- A paper copy in the mail.  At [facility], you and your health needs are our priority.  As part of our continuing mission to provide you with exceptional heart  care, we have created designated Provider Care Teams.  These Care Teams include your primary Cardiologist (physician) and Advanced Practice Providers (APPs -  Physician Assistants and Nurse Practitioners) who all work together to provide you with the care you need, when you need it.  Thank you for choosing [facility] at [facility]!!          Signed, Shelva Majestic, MD  05/24/2019 2:06 PM    [place] [address], [place], [place], [place]  [address] Phone: [phone]

## 2019-06-14 ENCOUNTER — Other Ambulatory Visit: Payer: Self-pay | Admitting: Physician Assistant

## 2019-06-17 DIAGNOSIS — E291 Testicular hypofunction: Secondary | ICD-10-CM | POA: Diagnosis not present

## 2019-06-18 ENCOUNTER — Other Ambulatory Visit: Payer: Self-pay

## 2019-06-18 MED ORDER — ISOSORBIDE MONONITRATE ER 30 MG PO TB24: 15.0000 mg | ORAL_TABLET | Freq: Every day | ORAL | 3 refills | Status: DC

## 2019-06-19 ENCOUNTER — Other Ambulatory Visit: Payer: Self-pay

## 2019-06-19 MED ORDER — ISOSORBIDE MONONITRATE ER 30 MG PO TB24: 15.0000 mg | ORAL_TABLET | Freq: Every day | ORAL | 3 refills | Status: DC

## 2019-07-08 ENCOUNTER — Other Ambulatory Visit: Payer: Self-pay | Admitting: Family Medicine

## 2019-07-08 MED ORDER — ISOSORBIDE MONONITRATE ER 30 MG PO TB24: 15.0000 mg | ORAL_TABLET | Freq: Every day | ORAL | 3 refills | Status: DC

## 2019-07-08 NOTE — Telephone Encounter (Signed)
°*  STAT* If patient is at the pharmacy, call can be transferred to refill team.   1. Which medications need to be refilled? (please list name of each medication and dose if known) Isosorbide  2. Which pharmacy/location (including street and city if local pharmacy) is medication to be sent to? [facility] [place]   3. Do they need a 30 day or 90 day supply? 90 and refills

## 2019-07-10 DIAGNOSIS — I2510 Atherosclerotic heart disease of native coronary artery without angina pectoris: Secondary | ICD-10-CM | POA: Diagnosis not present

## 2019-07-10 DIAGNOSIS — N183 Chronic kidney disease, stage 3 unspecified: Secondary | ICD-10-CM | POA: Diagnosis not present

## 2019-07-10 DIAGNOSIS — I10 Essential (primary) hypertension: Secondary | ICD-10-CM | POA: Diagnosis not present

## 2019-07-10 DIAGNOSIS — E7800 Pure hypercholesterolemia, unspecified: Secondary | ICD-10-CM | POA: Diagnosis not present

## 2019-07-10 DIAGNOSIS — N400 Enlarged prostate without lower urinary tract symptoms: Secondary | ICD-10-CM | POA: Diagnosis not present

## 2019-07-15 DIAGNOSIS — E291 Testicular hypofunction: Secondary | ICD-10-CM | POA: Diagnosis not present

## 2019-08-19 DIAGNOSIS — E291 Testicular hypofunction: Secondary | ICD-10-CM | POA: Diagnosis not present

## 2019-09-02 DIAGNOSIS — R202 Paresthesia of skin: Secondary | ICD-10-CM | POA: Diagnosis not present

## 2019-09-02 DIAGNOSIS — R200 Anesthesia of skin: Secondary | ICD-10-CM | POA: Diagnosis not present

## 2019-09-02 DIAGNOSIS — M79671 Pain in right foot: Secondary | ICD-10-CM | POA: Diagnosis not present

## 2019-09-12 ENCOUNTER — Other Ambulatory Visit: Payer: Self-pay | Admitting: Cardiovascular Disease

## 2019-09-12 NOTE — Telephone Encounter (Signed)
Rx request sent to pharmacy.  

## 2019-09-16 DIAGNOSIS — E291 Testicular hypofunction: Secondary | ICD-10-CM | POA: Diagnosis not present

## 2019-09-25 ENCOUNTER — Other Ambulatory Visit: Payer: Self-pay | Admitting: Cardiovascular Disease

## 2019-09-25 NOTE — Telephone Encounter (Signed)
Rx(s) sent to pharmacy electronically.  

## 2019-10-07 DIAGNOSIS — E538 Deficiency of other specified B group vitamins: Secondary | ICD-10-CM | POA: Diagnosis not present

## 2019-10-15 DIAGNOSIS — E291 Testicular hypofunction: Secondary | ICD-10-CM | POA: Diagnosis not present

## 2019-11-18 DIAGNOSIS — E291 Testicular hypofunction: Secondary | ICD-10-CM | POA: Diagnosis not present

## 2019-12-10 DIAGNOSIS — L723 Sebaceous cyst: Secondary | ICD-10-CM | POA: Diagnosis not present

## 2019-12-10 DIAGNOSIS — R7303 Prediabetes: Secondary | ICD-10-CM | POA: Diagnosis not present

## 2019-12-10 DIAGNOSIS — I2510 Atherosclerotic heart disease of native coronary artery without angina pectoris: Secondary | ICD-10-CM | POA: Diagnosis not present

## 2019-12-10 DIAGNOSIS — E291 Testicular hypofunction: Secondary | ICD-10-CM | POA: Diagnosis not present

## 2019-12-10 DIAGNOSIS — Z0001 Encounter for general adult medical examination with abnormal findings: Secondary | ICD-10-CM | POA: Diagnosis not present

## 2019-12-10 DIAGNOSIS — E7800 Pure hypercholesterolemia, unspecified: Secondary | ICD-10-CM | POA: Diagnosis not present

## 2019-12-10 DIAGNOSIS — I10 Essential (primary) hypertension: Secondary | ICD-10-CM | POA: Diagnosis not present

## 2019-12-10 DIAGNOSIS — M109 Gout, unspecified: Secondary | ICD-10-CM | POA: Diagnosis not present

## 2019-12-13 DIAGNOSIS — L7211 Pilar cyst: Secondary | ICD-10-CM | POA: Diagnosis not present

## 2019-12-13 DIAGNOSIS — L821 Other seborrheic keratosis: Secondary | ICD-10-CM | POA: Diagnosis not present

## 2019-12-13 DIAGNOSIS — L080 Pyoderma: Secondary | ICD-10-CM | POA: Diagnosis not present

## 2019-12-17 DIAGNOSIS — E291 Testicular hypofunction: Secondary | ICD-10-CM | POA: Diagnosis not present

## 2020-01-06 ENCOUNTER — Telehealth: Payer: Self-pay | Admitting: Cardiovascular Disease

## 2020-01-06 NOTE — Telephone Encounter (Signed)
Patient called because he wants to start taking Relief factor supplement, he wants to know if this is okay for him to take. He says it has several different herbs in it.

## 2020-01-08 NOTE — Telephone Encounter (Signed)
Probably okay for him to take

## 2020-01-09 NOTE — Telephone Encounter (Signed)
Called and spoke with pt, notified that per [facility] it is probably okay for him to take the Relief Factor Supplement. Pt verbalized understanding with no other questions at this time.

## 2020-01-14 DIAGNOSIS — E291 Testicular hypofunction: Secondary | ICD-10-CM | POA: Diagnosis not present

## 2020-01-16 ENCOUNTER — Other Ambulatory Visit: Payer: Self-pay | Admitting: Cardiovascular Disease

## 2020-02-17 DIAGNOSIS — E291 Testicular hypofunction: Secondary | ICD-10-CM | POA: Diagnosis not present

## 2020-02-27 ENCOUNTER — Telehealth: Payer: Self-pay | Admitting: Cardiovascular Disease

## 2020-02-27 DIAGNOSIS — E785 Hyperlipidemia, unspecified: Secondary | ICD-10-CM

## 2020-02-27 NOTE — Telephone Encounter (Signed)
Patient wants to know if lab work is needed prior to his appt with [name] on 06/19/20.

## 2020-02-27 NOTE — Telephone Encounter (Signed)
Spoke with pt, dr Claiborne Billings usually checks his cholesterol. Lab orders mailed to the pt

## 2020-04-14 DIAGNOSIS — E291 Testicular hypofunction: Secondary | ICD-10-CM | POA: Diagnosis not present

## 2020-04-26 ENCOUNTER — Other Ambulatory Visit: Payer: Self-pay | Admitting: Physician Assistant

## 2020-05-13 DIAGNOSIS — E291 Testicular hypofunction: Secondary | ICD-10-CM | POA: Diagnosis not present

## 2020-05-17 DIAGNOSIS — M25511 Pain in right shoulder: Secondary | ICD-10-CM | POA: Diagnosis not present

## 2020-05-17 DIAGNOSIS — M25572 Pain in left ankle and joints of left foot: Secondary | ICD-10-CM | POA: Diagnosis not present

## 2020-05-27 DIAGNOSIS — M25511 Pain in right shoulder: Secondary | ICD-10-CM | POA: Diagnosis not present

## 2020-05-27 DIAGNOSIS — M84375A Stress fracture, left foot, initial encounter for fracture: Secondary | ICD-10-CM | POA: Diagnosis not present

## 2020-05-31 ENCOUNTER — Other Ambulatory Visit: Payer: Self-pay | Admitting: Cardiovascular Disease

## 2020-06-04 DIAGNOSIS — N200 Calculus of kidney: Secondary | ICD-10-CM | POA: Diagnosis not present

## 2020-06-04 DIAGNOSIS — M79672 Pain in left foot: Secondary | ICD-10-CM | POA: Diagnosis not present

## 2020-06-04 DIAGNOSIS — N400 Enlarged prostate without lower urinary tract symptoms: Secondary | ICD-10-CM | POA: Diagnosis not present

## 2020-06-04 DIAGNOSIS — M25511 Pain in right shoulder: Secondary | ICD-10-CM | POA: Diagnosis not present

## 2020-06-04 DIAGNOSIS — E349 Endocrine disorder, unspecified: Secondary | ICD-10-CM | POA: Diagnosis not present

## 2020-06-08 DIAGNOSIS — E785 Hyperlipidemia, unspecified: Secondary | ICD-10-CM | POA: Diagnosis not present

## 2020-06-08 DIAGNOSIS — M25511 Pain in right shoulder: Secondary | ICD-10-CM | POA: Diagnosis not present

## 2020-06-08 LAB — COMPREHENSIVE METABOLIC PANEL
ALT: 32 IU/L (ref 0–44)
AST: 27 IU/L (ref 0–40)
Albumin/Globulin Ratio: 2.2 (ref 1.2–2.2)
Albumin: 4.6 g/dL (ref 3.8–4.8)
Alkaline Phosphatase: 60 IU/L (ref 44–121)
BUN/Creatinine Ratio: 15 (ref 10–24)
BUN: 19 mg/dL (ref 8–27)
Bilirubin Total: 2.5 mg/dL — ABNORMAL HIGH (ref 0.0–1.2)
CO2: 21 mmol/L (ref 20–29)
Calcium: 9.2 mg/dL (ref 8.6–10.2)
Chloride: 104 mmol/L (ref 96–106)
Creatinine, Ser: 1.24 mg/dL (ref 0.76–1.27)
GFR calc Af Amer: 68 mL/min/{1.73_m2} (ref >59)
GFR calc non Af Amer: 59 mL/min/{1.73_m2} — ABNORMAL LOW (ref >59)
Globulin, Total: 2.1 g/dL (ref 1.5–4.5)
Glucose: 91 mg/dL (ref 65–99)
Potassium: 4.7 mmol/L (ref 3.5–5.2)
Sodium: 138 mmol/L (ref 134–144)
Total Protein: 6.7 g/dL (ref 6.0–8.5)

## 2020-06-08 LAB — LIPID PANEL
Chol/HDL Ratio: 3.4 ratio (ref 0.0–5.0)
Cholesterol, Total: 134 mg/dL (ref 100–199)
HDL: 40 mg/dL (ref >39)
LDL Chol Calc (NIH): 68 mg/dL (ref 0–99)
Triglycerides: 150 mg/dL — ABNORMAL HIGH (ref 0–149)
VLDL Cholesterol Cal: 26 mg/dL (ref 5–40)

## 2020-06-09 ENCOUNTER — Telehealth: Payer: Self-pay | Admitting: *Deleted

## 2020-06-09 NOTE — Telephone Encounter (Signed)
Callback pool to include preop clearance to the reason behind upcoming follow up with [name] on 9/24. Will defer to MD for final clearance.

## 2020-06-09 NOTE — Telephone Encounter (Signed)
° °  [facility] Pre-operative Risk Assessment    PRIMARY CARDIOLOGIST ;DR Shelva Majestic   Request for surgical clearance:  1. What type of surgery is being performed? RIGHT SHOULDER SCOPE ROTATOR CUFF REPAIR   2. When is this surgery scheduled? TBD  3. What type of clearance is required (medical clearance vs. Pharmacy clearance to hold med vs. Both)? MEDICAL  4. Are there any medications that need to be held prior to surgery and how long?ASPIRIN ,PLAVIX  5. Practice name and name of physician performing surgery? [facility] ; DR Floyde Parkins  6. What is the office phone number? [place]   What is the office fax number? [place]   Anesthesia type (None, local, MAC, general) ? UNKNOWN   Ryan Scott 06/09/2020, 1:04 PM  _________________________________________________________________   (provider comments below)

## 2020-06-10 NOTE — Telephone Encounter (Signed)
Pt is scheduled for an office visit 06/19/20 and clearance will be addressed at that visit. Will route back to requesting surgeon's office to make them aware.

## 2020-06-15 DIAGNOSIS — M109 Gout, unspecified: Secondary | ICD-10-CM | POA: Diagnosis not present

## 2020-06-15 DIAGNOSIS — Z23 Encounter for immunization: Secondary | ICD-10-CM | POA: Diagnosis not present

## 2020-06-15 DIAGNOSIS — E7800 Pure hypercholesterolemia, unspecified: Secondary | ICD-10-CM | POA: Diagnosis not present

## 2020-06-15 DIAGNOSIS — I2510 Atherosclerotic heart disease of native coronary artery without angina pectoris: Secondary | ICD-10-CM | POA: Diagnosis not present

## 2020-06-15 DIAGNOSIS — E291 Testicular hypofunction: Secondary | ICD-10-CM | POA: Diagnosis not present

## 2020-06-15 DIAGNOSIS — I10 Essential (primary) hypertension: Secondary | ICD-10-CM | POA: Diagnosis not present

## 2020-06-16 DIAGNOSIS — E291 Testicular hypofunction: Secondary | ICD-10-CM | POA: Diagnosis not present

## 2020-06-19 ENCOUNTER — Encounter: Payer: Self-pay | Admitting: Cardiovascular Disease

## 2020-06-19 ENCOUNTER — Ambulatory Visit: Payer: PPO | Admitting: Cardiovascular Disease

## 2020-06-19 ENCOUNTER — Other Ambulatory Visit: Payer: Self-pay

## 2020-06-19 VITALS — BP 124/80 | HR 69 | Temp 97.7°F | Ht 70.0 in | Wt 238.0 lb

## 2020-06-19 DIAGNOSIS — Z9861 Coronary angioplasty status: Secondary | ICD-10-CM | POA: Diagnosis not present

## 2020-06-19 DIAGNOSIS — I4510 Unspecified right bundle-branch block: Secondary | ICD-10-CM | POA: Diagnosis not present

## 2020-06-19 DIAGNOSIS — I491 Atrial premature depolarization: Secondary | ICD-10-CM

## 2020-06-19 DIAGNOSIS — Z01818 Encounter for other preprocedural examination: Secondary | ICD-10-CM | POA: Diagnosis not present

## 2020-06-19 DIAGNOSIS — I2510 Atherosclerotic heart disease of native coronary artery without angina pectoris: Secondary | ICD-10-CM | POA: Diagnosis not present

## 2020-06-19 DIAGNOSIS — E785 Hyperlipidemia, unspecified: Secondary | ICD-10-CM

## 2020-06-19 NOTE — Patient Instructions (Signed)
   Follow-Up: At [facility], you and your health needs are our priority.  As part of our continuing mission to provide you with exceptional heart care, we have created designated Provider Care Teams.  These Care Teams include your primary Cardiologist (physician) and Advanced Practice Providers (APPs -  Physician Assistants and Nurse Practitioners) who all work together to provide you with the care you need, when you need it.  We recommend signing up for the patient portal called "MyChart".  Sign up information is provided on this After Visit Summary.  MyChart is used to connect with patients for Virtual Visits (Telemedicine).  Patients are able to view lab/test results, encounter notes, upcoming appointments, etc.  Non-urgent messages can be sent to your provider as well.   To learn more about what you can do with MyChart, go to [email or web address].com.    Your next appointment:   12 month(s)  The format for your next appointment:   In Person  Provider:   You may see Thomas Kelly, MD or one of the following Advanced Practice Providers on your designated Care Team:    Hao Meng, PA-C  Angela Duke, PA-C or   Krista Kroeger, PA-C     

## 2020-06-19 NOTE — Progress Notes (Signed)
Cardiology Office Note    Date:  06/19/2020   ID:  Ryan Scott, Ryan Scott 26-Jul-1951, MRN [id number]  PCP:  [name], MD  Cardiologist:  Shelva Majestic, MD   F/U evaluation  History of Present Illness:  Ryan Scott is a 69 y.o. male who presents for a 13 month follow-up cardiology evaluation.  Mr. Wolden had remotely seen Dr. Rollene Fare.  He was seen in March 2019 by Jory Sims, NP referred by Dr. Harlene Ramus after his CT of his chest healed significant coronary calcification.  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  He had noticed more shortness of breath but denied any significant chest pain symptomatology.  With his symptoms and CT scan, he was referred for cardiac catheterization.  On December 19, 2017 cardiac catheterization performed by me showed low normal LV function with an EF of 50 to 55%.  There was significant coronary calcification 20 to 30% proximal LAD stenosis and then 80% stenosis after the first diagonal with 80% distal stenosis.  Circumflex had 60% focal OM1 stenosis prior to bifurcation and the RCA was significantly calcified with 50% stenosis in the proximal bend of the vessel and diffuse 70, 80, and 95% stenosis beyond the acute margin extending up to the takeoff of the PDA.  The PDA and PLA were collateralized via the distal circumflex.  He underwent a difficult but successful PCI to the calcified LAD with ultimate insertion of a 3.0 x 16 mm Synergy DES stent to the proximal lesion PTCA of the distal LAD.  Subsequently, he was on aspirin and Brilinta and had been on isosorbide 30 mg in addition to metoprolol 12.5 mg twice a day.  He also has been on Zetia 10 mg in addition to Livalo reportedly at 1 mg daily.  He has experienced shortness of breath really when he lies down.  He denies any significant volume issues.  He has noticed some slight improvement with dyspnea following coffee.  I saw him for initial evaluation  following his hospitalization in July 2019.  Due to potential Brilinta induced dyspnea he was switched to clopidogrel 75 mg daily.  Subsequent P2 Y12 testing adjusted possible hyper responsiveness at 7 PR U.  Subsequently, his shortness of breath has improved.   I last saw him in November 2019.  He was unaware of any excess bleeding.  He continues to be active and is able to do anything he can without chest pain or shortness of breath.  He underwent went repeat laboratory which showed a total cholesterol of 111 triglycerides 74 HDL 41 and LDL cholesterol 55 while on combination Zetia and Livalo.  He was concerned about the cost of Livalo and since he was unaware of any issues with other statins I recommended changing to atorvastatin.   I last saw him in August 2020.  Since his prior evaluation he had been stable and denied any recurrent chest pain or anginal symptomatology.  He had initially lost about 25 pounds following his coronary event but unfortunately due to the to the Covid pandemic began to develop increasing weight.    Since his last evaluation, he continues to be without chest pain or shortness of breath.  He suffered a stress fracture in his left foot and has been in the boot for the past 4 to 5 weeks.  He admits to further weight gain.  He will will be in need for future rotator cuff surgery to be  done by Dr. Mardelle Matte in several months.  He denies any presyncope or syncope.  At times he does note an occasional palpitation.  He presents for evaluation and preoperative clearance.  Past Medical History:  Diagnosis Date  . Arthritis    "back, neck, shoulders" (12/19/2017)  . Bladder cancer ([place])   . Borderline hypertension   . BPH (benign prostatic hyperplasia)   . Chronic lower back pain   . ED (erectile dysfunction)   . High cholesterol    "started BP RX 12/18/2017"  . History of adenomatous polyp of colon    tubular adenoma  . History of kidney stones   . Hypertension   . Hypogonadism  male   . Left ureteral stone   . Lower urinary tract symptoms (LUTS)   . RBBB (right bundle branch block)     Past Surgical History:  Procedure Laterality Date  . CARDIOVASCULAR STRESS TEST  10-16-2012   normal myocardial perfusion study,  no ischemia/  not gated  . COLONOSCOPY  last one 07-26-2013  . CORONARY STENT INTERVENTION N/A 12/19/2017   Procedure: CORONARY STENT INTERVENTION;  Surgeon: [name], MD;  Location: [facility];  Service: Cardiovascular;  Laterality: N/A;  . CYSTOSCOPY/RETROGRADE/URETEROSCOPY/STONE EXTRACTION WITH BASKET  1989  . LEFT HEART CATH AND CORONARY ANGIOGRAPHY N/A 12/19/2017   Procedure: LEFT HEART CATH AND CORONARY ANGIOGRAPHY;  Surgeon: [name], MD;  Location: [facility];  Service: Cardiovascular;  Laterality: N/A;  LAD   . PROSTATE SURGERY  2002  . PROSTATE SURGERY  2002  . REFRACTIVE SURGERY Bilateral   . SHOULDER ARTHROSCOPY WITH SUBACROMIAL DECOMPRESSION, ROTATOR CUFF REPAIR AND BICEP TENDON REPAIR Left 2013  . TONSILLECTOMY  ~ 1956  . TRANSTHORACIC ECHOCARDIOGRAM  10-16-2012   normal LVF, ef 55-65%,  mild AV sclerosis without stenosis,  mild MR and TR,  mild LAE,  trivial PR  . TRANSURETHRAL RESECTION OF BLADDER TUMOR WITH GYRUS (TURBT-GYRUS)  1989   and URETEROSCOPIC STONE EXTRACTION    Current Medications: Outpatient Medications Prior to Visit  Medication Sig Dispense Refill  . allopurinol (ZYLOPRIM) 100 MG tablet Take 100 mg by mouth daily.    Marland Kitchen aspirin 81 MG chewable tablet Chew 1 tablet (81 mg total) by mouth daily.    Marland Kitchen atorvastatin (LIPITOR) 40 MG tablet TAKE 1 TABLET BY MOUTH EVERY DAY 90 tablet 2  . clopidogrel (PLAVIX) 75 MG tablet TAKE 1 TABLET BY MOUTH EVERY DAY 90 tablet 3  . ezetimibe (ZETIA) 10 MG tablet Take 1 tablet (10 mg total) by mouth daily. 90 tablet 2  . isosorbide mononitrate (IMDUR) 30 MG 24 hr tablet TAKE 1/2 TABLET (15 MG TOTAL) BY MOUTH DAILY. 45 tablet 1  . metoprolol tartrate  (LOPRESSOR) 25 MG tablet TAKE 1/2 TABLET BY MOUTH TWICE A DAY 90 tablet 3  . testosterone cypionate (DEPOTESTOTERONE CYPIONATE) 200 MG/ML injection Inject 200 mg into the muscle every 28 (twenty-eight) days.     . nitroGLYCERIN (NITROSTAT) 0.4 MG SL tablet Place 1 tablet (0.4 mg total) under the tongue every 5 (five) minutes as needed for chest pain. 25 tablet 3   No facility-administered medications prior to visit.     Allergies:   Patient has no known allergies.   Social History   Socioeconomic History  . Marital status: Married    Spouse name: Not on file  . Number of children: Not on file  . Years of education: Not on file  . Highest education level:  Not on file  Occupational History  . Not on file  Tobacco Use  . Smoking status: Never Smoker  . Smokeless tobacco: Former Systems developer    Types: Secondary school teacher  . Vaping Use: Never used  Substance and Sexual Activity  . Alcohol use: Yes    Alcohol/week: 2.0 standard drinks    Types: 2 Standard drinks or equivalent per week  . Drug use: Not Currently  . Sexual activity: Not on file  Other Topics Concern  . Not on file  Social History Narrative  . Not on file   Social Determinants of Health   Financial Resource Strain:   . Difficulty of Paying Living Expenses: Not on file  Food Insecurity:   . Worried About Charity fundraiser in the Last Year: Not on file  . Ran Out of Food in the Last Year: Not on file  Transportation Needs:   . Lack of Transportation (Medical): Not on file  . Lack of Transportation (Non-Medical): Not on file  Physical Activity:   . Days of Exercise per Week: Not on file  . Minutes of Exercise per Session: Not on file  Stress:   . Feeling of Stress : Not on file  Social Connections:   . Frequency of Communication with Friends and Family: Not on file  . Frequency of Social Gatherings with Friends and Family: Not on file  . Attends Religious Services: Not on file  . Active Member of Clubs or  Organizations: Not on file  . Attends Archivist Meetings: Not on file  . Marital Status: Not on file     Family History:  The patient's family history includes Aneurysm in his paternal grandfather; Arthritis in his father and sister; Breast cancer in his mother; Heart attack in his father; Heart disease in his father; Hypertension in his father; Lung cancer in his maternal grandfather; Other in his paternal grandmother; Stroke in his maternal grandmother; Thyroid disease in his mother.   ROS General: Negative; No fevers, chills, or night sweats;  HEENT: Negative; No changes in vision or hearing, sinus congestion, difficulty swallowing Pulmonary: Mild shortness of breath.  Possible silicosis, not definitively diagnosed Cardiovascular: Negative; No chest pain, presyncope, syncope, palpitations GI: Negative; No nausea, vomiting, diarrhea, or abdominal pain GU: Negative; No dysuria, hematuria, or difficulty voiding Musculoskeletal: Left foot fracture, currently in a boot Hematologic/Oncology: Negative; no easy bruising, bleeding Endocrine: Negative; no heat/cold intolerance; no diabetes Neuro: Negative; no changes in balance, headaches Skin: Negative; No rashes or skin lesions Psychiatric: Negative; No behavioral problems, depression Sleep: Negative; No snoring, daytime sleepiness, hypersomnolence, bruxism, restless legs, hypnogognic hallucinations, no cataplexy Other comprehensive 14 point system review is negative.   PHYSICAL EXAM:   VS:  BP 124/80   Pulse 69   Temp 97.7 F (36.5 C)   Ht _0  (1.778 m)   Wt 238 lb (108 kg)   SpO2 93%   BMI 34.15 kg/m     Repeat blood pressure by me was 122/70  Wt Readings from Last 3 Encounters:  06/19/20 238 lb (108 kg)  05/24/19 230 lb 9.6 oz (104.6 kg)  08/16/18 214 lb 3.2 oz (97.2 kg)   General: Alert, oriented, no distress.  Skin: normal turgor, no rashes, warm and dry HEENT: Normocephalic, atraumatic. Pupils equal round  and reactive to light; sclera anicteric; extraocular muscles intact;  Nose without nasal septal hypertrophy Mouth/Parynx benign; Mallinpatti scale 3 Neck: No JVD, no carotid bruits; normal carotid upstroke Lungs:  clear to ausculatation and percussion; no wheezing or rales Chest wall: without tenderness to palpitation Heart: PMI not displaced, RRR, s1 s2 normal, 1/6 systolic murmur, no diastolic murmur, no rubs, gallops, thrills, or heaves Abdomen: soft, nontender; no hepatosplenomehaly, BS+; abdominal aorta nontender and not dilated by palpation. Back: no CVA tenderness Pulses 2+ Musculoskeletal: Left foot in boot;  normal strength, no joint deformities Extremities: no clubbing cyanosis or edema, Homan's sign negative  Neurologic: grossly nonfocal; Cranial nerves grossly wnl Psychologic: Normal mood and affect   Studies/Labs Reviewed:   EKG:  EKG is  ordered today. ECG (independently read by me): Sinus rhythm with frequent PACs, right bundle branch block borderline first-degree block with PR interval 202  ms.  QS complex in V3.   August 2020 ECG (independently read by me): Sinus bradycardia 56 bpm, incomplete right bundle branch block.  Borderline first-degree AV block with a PR interval 2 8 ms.  Small inferior Q waves  November 2019 ECG (independently read by me): Sinus bradycardia 52 bpm.  First-degree AV block.  Right bundle branch block.  July 2019 ECG (independently read by me): Sinus bradycardia at 50 bpm.  Mild first-degree AV block with a PR interval of 218 ms.  Incomplete right bundle branch block.  Recent Labs: BMP Latest Ref Rng & Units 06/08/2020 05/17/2019 06/26/2018  Glucose 65 - 99 mg/dL 91 91 91  BUN 8 - 27 mg/dL _0 Creatinine 0.76 - 1.27 mg/dL 1.24 1.26 1.21  BUN/Creat Ratio 10 - _1 Sodium 134 - 144 mmol/L 138 139 142  Potassium 3.5 - 5.2 mmol/L 4.7 4.4 3.8  Chloride 96 - 106 mmol/L 104 102 105  CO2 20 - 29 mmol/L _2 Calcium 8.6 - 10.2 mg/dL  9.2 9.4 9.1     Hepatic Function Latest Ref Rng & Units 06/08/2020 05/17/2019 06/26/2018  Total Protein 6.0 - 8.5 g/dL 6.7 6.7 6.5  Albumin 3.8 - 4.8 g/dL 4.6 4.5 4.3  AST 0 - 40 IU/L _3 ALT 0 - 44 IU/L 32 30 19  Alk Phosphatase 44 - 121 IU/L 60 56 48  Total Bilirubin 0.0 - 1.2 mg/dL 2.5(H) 2.1(H) 1.0  Bilirubin, Direct 0.00 - 0.40 mg/dL - - -    CBC Latest Ref Rng & Units 12/20/2017 12/18/2017 05/16/2013  WBC 4.0 - 10.5 K/uL 8.8 6.9 6.9  Hemoglobin 13.0 - 17.0 g/dL 14.8 17.0 17.2(H)  Hematocrit 39 - 52 % 44.7 49.8 49.9  Platelets 150 - 400 K/uL 190 236 231   Lab Results  Component Value Date   MCV 90.1 12/20/2017   MCV 89 12/18/2017   MCV 87.1 05/16/2013   No results found for: TSH No results found for: HGBA1C   BNP No results found for: BNP  ProBNP No results found for: PROBNP   Lipid Panel     Component Value Date/Time   CHOL 134 06/08/2020 1059   TRIG 150 (H) 06/08/2020 1059   HDL 40 06/08/2020 1059   CHOLHDL 3.4 06/08/2020 1059   LDLCALC 68 06/08/2020 1059     RADIOLOGY: No results found.   Additional studies/ records that were reviewed today include:   CT CHEST: IMPRESSION: 1. Two right middle lobe pulmonary nodules are unchanged since 2016 and benign. No additional imaging follow-up is necessary. 2. No acute pulmonary findings. Mild bilateral middle lobe and lower lobe scarring/atelectasis. 3. Extensive coronary artery calcified atherosclerosis and/or stents. ------------------------------------------------------------------------------------------------------  December 19, 2017 cardiac  catheterization/PCI:  Low normal global LV function with an EF of 50-55% without definitive focal segmental wall motion abnormalities.  Significant coronary calcification with vessel CAD.  The LAD had proximal 20-30% stenosis and a calcified segment and then following a sharp and had 80% stenosis after the first diagonal vessel and 80% distal stenosis;  60%  focal OM1 stenosis prior to a bifurcation; calcified RCA with possible 50% stenosis on a proximal bend in the vessel.  There is diffuse 70-80-95% stenoses  beyond the acute margin extending up to the takeoff of the PDA.  There is collateralization of the PDA and PLA vessels via the distal circumflex.  Difficult but successful PCI to the calcified LAD with ultimate insertion of a 3.016 mm Synergy DES stent to the proximal lesion with the 80% stenosis being reduced to 0%, and PTCA of the distal LAD lesion with the 80% stenosis being reduced to less than 5% utilizing bivalirudin and aspirin/Brilinta.  RECOMMENDATION: DAPT for a mininum of one year. Following this initial procedure he will be started on anti-ischemic medical therapy including beta blocker and nitrates.  Will review angiograms with colleagues.  The distal RCA is collateralized.  Consider attempt at initial medical therapy of the diffusely diseased distal calcified RCA versus staged  PCI.   ASSESSMENT:    1. Coronary artery disease involving native coronary artery of native heart without angina pectoris   2. CAD S/P percutaneous coronary intervention   3. Hyperlipidemia with target LDL less than 70   4. Preoperative clearance   5. Right bundle branch block   6. PAC (premature atrial contraction)      PLAN:  Mr. Nikolus Marczak is a 69 year-old gentleman who has a history of mild hypertension, hyperlipidemia, and has documented stable pulmonary nodules and extensive coronary calcification.  He was found to have multivessel CAD and underwent successful stenting of a calcified proximal LAD stenosis with PTCA of the distal LAD stenosis in March 2019.  At that time he had diffuse disease in his RCA which was significantly narrowed distally but there was good collateralization to the distal RCA from the circumflex territory.  Since his coronary intervention he has done well.  He denies any episodes of chest tightness but prior to his  catheterization he had never experienced chest pressure before.  He had developed dyspnea related to Brilinta and he is on Plavix therapy.  A P2Y12 test suggested possible hyper responsiveness with the*17 allele.  He is tolerating Plavix and denies any bleeding he has remained active.  Due to his significant concomitant CAD he has been continued on low-dose isosorbide in addition to metoprolol.  His blood pressure today on repeat testing by me was stable on this therapy.  He has not had any recurrent anginal symptomatology.  His ECG today shows sinus rhythm with PACs.  I have suggested slight titration of metoprolol tartrate from 12.5 mg twice daily to 25 mg in the morning and 12.5 mg at night.  He continues to be on atorvastatin 40 mg and Zetia 10 mg for hyperlipidemia.  Most recent laboratory on June 08, 2020 showed an LDL cholesterol at 68.  He is tolerating aspirin and Plavix without bleeding.  From a cardiac standpoint, he is stable to undergo his shoulder surgery which he states may be in several months.  He will need to hold aspirin and Plavix for at least 5 days prior to the procedure.  He has right bundle branch block which is stable with mild first-degree  AV block.  He will continue therapy as prescribed.  I will see him in 1 year for reevaluation or sooner as needed.  Medication Adjustments/Labs and Tests Ordered: Current medicines are reviewed at length with the patient today.  Concerns regarding medicines are outlined above.  Medication changes, Labs and Tests ordered today are listed in the Patient Instructions below. Patient Instructions   Follow-Up: At [facility], you and your health needs are our priority.  As part of our continuing mission to provide you with exceptional heart care, we have created designated Provider Care Teams.  These Care Teams include your primary Cardiologist (physician) and Advanced Practice Providers (APPs -  Physician Assistants and Nurse Practitioners) who  all work together to provide you with the care you need, when you need it.  We recommend signing up for the patient portal called "MyChart".  Sign up information is provided on this After Visit Summary.  MyChart is used to connect with patients for Virtual Visits (Telemedicine).  Patients are able to view lab/test results, encounter notes, upcoming appointments, etc.  Non-urgent messages can be sent to your provider as well.   To learn more about what you can do with MyChart, go to [email or web address].    Your next appointment:   12 month(s)  The format for your next appointment:   In Person  Provider:   You may see Shelva Majestic, MD or one of the following Advanced Practice Providers on your designated Care Team:    Almyra Deforest, PA-C  Fabian Sharp, PA-C or   Roby Lofts, [place]        Signed, Shelva Majestic, MD  06/19/2020 6:33 PM    [facility] [address] [place], [place], [place]  [address] Phone: [phone]

## 2020-07-14 ENCOUNTER — Other Ambulatory Visit: Payer: Self-pay | Admitting: Cardiovascular Disease

## 2020-07-14 DIAGNOSIS — E291 Testicular hypofunction: Secondary | ICD-10-CM | POA: Diagnosis not present

## 2020-07-24 DIAGNOSIS — M84375D Stress fracture, left foot, subsequent encounter for fracture with routine healing: Secondary | ICD-10-CM | POA: Diagnosis not present

## 2020-08-17 DIAGNOSIS — E291 Testicular hypofunction: Secondary | ICD-10-CM | POA: Diagnosis not present

## 2020-09-02 DIAGNOSIS — M25511 Pain in right shoulder: Secondary | ICD-10-CM | POA: Diagnosis not present

## 2020-09-02 DIAGNOSIS — M84375D Stress fracture, left foot, subsequent encounter for fracture with routine healing: Secondary | ICD-10-CM | POA: Diagnosis not present

## 2020-09-14 DIAGNOSIS — E291 Testicular hypofunction: Secondary | ICD-10-CM | POA: Diagnosis not present

## 2020-09-26 HISTORY — PX: SHOULDER SURGERY: SHX246

## 2020-10-14 DIAGNOSIS — E291 Testicular hypofunction: Secondary | ICD-10-CM | POA: Diagnosis not present

## 2020-10-22 DIAGNOSIS — X58XXXA Exposure to other specified factors, initial encounter: Secondary | ICD-10-CM | POA: Diagnosis not present

## 2020-10-22 DIAGNOSIS — S46811A Strain of other muscles, fascia and tendons at shoulder and upper arm level, right arm, initial encounter: Secondary | ICD-10-CM | POA: Diagnosis not present

## 2020-10-22 DIAGNOSIS — M7541 Impingement syndrome of right shoulder: Secondary | ICD-10-CM | POA: Diagnosis not present

## 2020-10-22 DIAGNOSIS — S46011A Strain of muscle(s) and tendon(s) of the rotator cuff of right shoulder, initial encounter: Secondary | ICD-10-CM | POA: Diagnosis not present

## 2020-10-22 DIAGNOSIS — M75121 Complete rotator cuff tear or rupture of right shoulder, not specified as traumatic: Secondary | ICD-10-CM | POA: Diagnosis not present

## 2020-10-22 DIAGNOSIS — Y999 Unspecified external cause status: Secondary | ICD-10-CM | POA: Diagnosis not present

## 2020-10-22 DIAGNOSIS — M24111 Other articular cartilage disorders, right shoulder: Secondary | ICD-10-CM | POA: Diagnosis not present

## 2020-10-22 DIAGNOSIS — G8918 Other acute postprocedural pain: Secondary | ICD-10-CM | POA: Diagnosis not present

## 2020-10-22 DIAGNOSIS — M66321 Spontaneous rupture of flexor tendons, right upper arm: Secondary | ICD-10-CM | POA: Diagnosis not present

## 2020-10-22 DIAGNOSIS — S43431A Superior glenoid labrum lesion of right shoulder, initial encounter: Secondary | ICD-10-CM | POA: Diagnosis not present

## 2020-10-22 DIAGNOSIS — M19011 Primary osteoarthritis, right shoulder: Secondary | ICD-10-CM | POA: Diagnosis not present

## 2020-11-04 ENCOUNTER — Other Ambulatory Visit: Payer: Self-pay | Admitting: Cardiovascular Disease

## 2020-11-04 DIAGNOSIS — M7541 Impingement syndrome of right shoulder: Secondary | ICD-10-CM | POA: Diagnosis not present

## 2020-11-18 DIAGNOSIS — E291 Testicular hypofunction: Secondary | ICD-10-CM | POA: Diagnosis not present

## 2020-12-07 DIAGNOSIS — M25511 Pain in right shoulder: Secondary | ICD-10-CM | POA: Diagnosis not present

## 2020-12-07 DIAGNOSIS — M25611 Stiffness of right shoulder, not elsewhere classified: Secondary | ICD-10-CM | POA: Diagnosis not present

## 2020-12-07 DIAGNOSIS — M6281 Muscle weakness (generalized): Secondary | ICD-10-CM | POA: Diagnosis not present

## 2020-12-07 DIAGNOSIS — M75121 Complete rotator cuff tear or rupture of right shoulder, not specified as traumatic: Secondary | ICD-10-CM | POA: Diagnosis not present

## 2020-12-11 DIAGNOSIS — M75121 Complete rotator cuff tear or rupture of right shoulder, not specified as traumatic: Secondary | ICD-10-CM | POA: Diagnosis not present

## 2020-12-11 DIAGNOSIS — M25511 Pain in right shoulder: Secondary | ICD-10-CM | POA: Diagnosis not present

## 2020-12-11 DIAGNOSIS — M25611 Stiffness of right shoulder, not elsewhere classified: Secondary | ICD-10-CM | POA: Diagnosis not present

## 2020-12-11 DIAGNOSIS — M6281 Muscle weakness (generalized): Secondary | ICD-10-CM | POA: Diagnosis not present

## 2020-12-14 DIAGNOSIS — M25511 Pain in right shoulder: Secondary | ICD-10-CM | POA: Diagnosis not present

## 2020-12-14 DIAGNOSIS — M25611 Stiffness of right shoulder, not elsewhere classified: Secondary | ICD-10-CM | POA: Diagnosis not present

## 2020-12-14 DIAGNOSIS — M75121 Complete rotator cuff tear or rupture of right shoulder, not specified as traumatic: Secondary | ICD-10-CM | POA: Diagnosis not present

## 2020-12-14 DIAGNOSIS — M6281 Muscle weakness (generalized): Secondary | ICD-10-CM | POA: Diagnosis not present

## 2020-12-15 DIAGNOSIS — E291 Testicular hypofunction: Secondary | ICD-10-CM | POA: Diagnosis not present

## 2020-12-16 DIAGNOSIS — E7800 Pure hypercholesterolemia, unspecified: Secondary | ICD-10-CM | POA: Diagnosis not present

## 2020-12-16 DIAGNOSIS — I10 Essential (primary) hypertension: Secondary | ICD-10-CM | POA: Diagnosis not present

## 2020-12-16 DIAGNOSIS — E291 Testicular hypofunction: Secondary | ICD-10-CM | POA: Diagnosis not present

## 2020-12-16 DIAGNOSIS — Z0000 Encounter for general adult medical examination without abnormal findings: Secondary | ICD-10-CM | POA: Diagnosis not present

## 2020-12-16 DIAGNOSIS — M109 Gout, unspecified: Secondary | ICD-10-CM | POA: Diagnosis not present

## 2020-12-16 DIAGNOSIS — I2510 Atherosclerotic heart disease of native coronary artery without angina pectoris: Secondary | ICD-10-CM | POA: Diagnosis not present

## 2020-12-17 DIAGNOSIS — M75121 Complete rotator cuff tear or rupture of right shoulder, not specified as traumatic: Secondary | ICD-10-CM | POA: Diagnosis not present

## 2020-12-17 DIAGNOSIS — M6281 Muscle weakness (generalized): Secondary | ICD-10-CM | POA: Diagnosis not present

## 2020-12-17 DIAGNOSIS — M25611 Stiffness of right shoulder, not elsewhere classified: Secondary | ICD-10-CM | POA: Diagnosis not present

## 2020-12-17 DIAGNOSIS — M7541 Impingement syndrome of right shoulder: Secondary | ICD-10-CM | POA: Diagnosis not present

## 2020-12-24 DIAGNOSIS — M25611 Stiffness of right shoulder, not elsewhere classified: Secondary | ICD-10-CM | POA: Diagnosis not present

## 2020-12-24 DIAGNOSIS — M6281 Muscle weakness (generalized): Secondary | ICD-10-CM | POA: Diagnosis not present

## 2020-12-24 DIAGNOSIS — M75121 Complete rotator cuff tear or rupture of right shoulder, not specified as traumatic: Secondary | ICD-10-CM | POA: Diagnosis not present

## 2020-12-24 DIAGNOSIS — M25511 Pain in right shoulder: Secondary | ICD-10-CM | POA: Diagnosis not present

## 2020-12-29 DIAGNOSIS — M6281 Muscle weakness (generalized): Secondary | ICD-10-CM | POA: Diagnosis not present

## 2020-12-29 DIAGNOSIS — M75121 Complete rotator cuff tear or rupture of right shoulder, not specified as traumatic: Secondary | ICD-10-CM | POA: Diagnosis not present

## 2020-12-29 DIAGNOSIS — M25611 Stiffness of right shoulder, not elsewhere classified: Secondary | ICD-10-CM | POA: Diagnosis not present

## 2020-12-29 DIAGNOSIS — M25511 Pain in right shoulder: Secondary | ICD-10-CM | POA: Diagnosis not present

## 2020-12-30 DIAGNOSIS — M25511 Pain in right shoulder: Secondary | ICD-10-CM | POA: Diagnosis not present

## 2021-01-05 DIAGNOSIS — M75121 Complete rotator cuff tear or rupture of right shoulder, not specified as traumatic: Secondary | ICD-10-CM | POA: Diagnosis not present

## 2021-01-05 DIAGNOSIS — M25511 Pain in right shoulder: Secondary | ICD-10-CM | POA: Diagnosis not present

## 2021-01-05 DIAGNOSIS — M25611 Stiffness of right shoulder, not elsewhere classified: Secondary | ICD-10-CM | POA: Diagnosis not present

## 2021-01-05 DIAGNOSIS — M6281 Muscle weakness (generalized): Secondary | ICD-10-CM | POA: Diagnosis not present

## 2021-01-11 DIAGNOSIS — R1084 Generalized abdominal pain: Secondary | ICD-10-CM | POA: Diagnosis not present

## 2021-01-11 DIAGNOSIS — N3000 Acute cystitis without hematuria: Secondary | ICD-10-CM | POA: Diagnosis not present

## 2021-01-11 DIAGNOSIS — N130 Hydronephrosis with ureteropelvic junction obstruction: Secondary | ICD-10-CM | POA: Diagnosis not present

## 2021-01-11 DIAGNOSIS — R310 Gross hematuria: Secondary | ICD-10-CM | POA: Diagnosis not present

## 2021-01-13 DIAGNOSIS — E291 Testicular hypofunction: Secondary | ICD-10-CM | POA: Diagnosis not present

## 2021-01-13 DIAGNOSIS — M75121 Complete rotator cuff tear or rupture of right shoulder, not specified as traumatic: Secondary | ICD-10-CM | POA: Diagnosis not present

## 2021-01-13 DIAGNOSIS — M25511 Pain in right shoulder: Secondary | ICD-10-CM | POA: Diagnosis not present

## 2021-01-13 DIAGNOSIS — M6281 Muscle weakness (generalized): Secondary | ICD-10-CM | POA: Diagnosis not present

## 2021-01-13 DIAGNOSIS — M25611 Stiffness of right shoulder, not elsewhere classified: Secondary | ICD-10-CM | POA: Diagnosis not present

## 2021-01-16 ENCOUNTER — Other Ambulatory Visit: Payer: Self-pay | Admitting: Cardiovascular Disease

## 2021-01-20 DIAGNOSIS — M7541 Impingement syndrome of right shoulder: Secondary | ICD-10-CM | POA: Diagnosis not present

## 2021-01-20 DIAGNOSIS — M25611 Stiffness of right shoulder, not elsewhere classified: Secondary | ICD-10-CM | POA: Diagnosis not present

## 2021-01-20 DIAGNOSIS — M6281 Muscle weakness (generalized): Secondary | ICD-10-CM | POA: Diagnosis not present

## 2021-01-20 DIAGNOSIS — M75121 Complete rotator cuff tear or rupture of right shoulder, not specified as traumatic: Secondary | ICD-10-CM | POA: Diagnosis not present

## 2021-01-26 DIAGNOSIS — R1084 Generalized abdominal pain: Secondary | ICD-10-CM | POA: Diagnosis not present

## 2021-01-26 DIAGNOSIS — R310 Gross hematuria: Secondary | ICD-10-CM | POA: Diagnosis not present

## 2021-01-27 DIAGNOSIS — M75121 Complete rotator cuff tear or rupture of right shoulder, not specified as traumatic: Secondary | ICD-10-CM | POA: Diagnosis not present

## 2021-01-28 DIAGNOSIS — M6281 Muscle weakness (generalized): Secondary | ICD-10-CM | POA: Diagnosis not present

## 2021-01-28 DIAGNOSIS — M75121 Complete rotator cuff tear or rupture of right shoulder, not specified as traumatic: Secondary | ICD-10-CM | POA: Diagnosis not present

## 2021-01-28 DIAGNOSIS — M25611 Stiffness of right shoulder, not elsewhere classified: Secondary | ICD-10-CM | POA: Diagnosis not present

## 2021-01-28 DIAGNOSIS — M7541 Impingement syndrome of right shoulder: Secondary | ICD-10-CM | POA: Diagnosis not present

## 2021-02-03 DIAGNOSIS — M75121 Complete rotator cuff tear or rupture of right shoulder, not specified as traumatic: Secondary | ICD-10-CM | POA: Diagnosis not present

## 2021-02-03 DIAGNOSIS — M25611 Stiffness of right shoulder, not elsewhere classified: Secondary | ICD-10-CM | POA: Diagnosis not present

## 2021-02-03 DIAGNOSIS — M6281 Muscle weakness (generalized): Secondary | ICD-10-CM | POA: Diagnosis not present

## 2021-02-03 DIAGNOSIS — M25511 Pain in right shoulder: Secondary | ICD-10-CM | POA: Diagnosis not present

## 2021-02-04 ENCOUNTER — Other Ambulatory Visit: Payer: Self-pay | Admitting: Cardiovascular Disease

## 2021-02-10 DIAGNOSIS — M25611 Stiffness of right shoulder, not elsewhere classified: Secondary | ICD-10-CM | POA: Diagnosis not present

## 2021-02-10 DIAGNOSIS — M75121 Complete rotator cuff tear or rupture of right shoulder, not specified as traumatic: Secondary | ICD-10-CM | POA: Diagnosis not present

## 2021-02-10 DIAGNOSIS — M6281 Muscle weakness (generalized): Secondary | ICD-10-CM | POA: Diagnosis not present

## 2021-02-10 DIAGNOSIS — M7541 Impingement syndrome of right shoulder: Secondary | ICD-10-CM | POA: Diagnosis not present

## 2021-02-16 DIAGNOSIS — E291 Testicular hypofunction: Secondary | ICD-10-CM | POA: Diagnosis not present

## 2021-02-16 DIAGNOSIS — N200 Calculus of kidney: Secondary | ICD-10-CM | POA: Diagnosis not present

## 2021-02-16 DIAGNOSIS — N130 Hydronephrosis with ureteropelvic junction obstruction: Secondary | ICD-10-CM | POA: Diagnosis not present

## 2021-02-16 DIAGNOSIS — R1084 Generalized abdominal pain: Secondary | ICD-10-CM | POA: Diagnosis not present

## 2021-03-09 ENCOUNTER — Other Ambulatory Visit: Payer: Self-pay | Admitting: Cardiovascular Disease

## 2021-03-17 DIAGNOSIS — E291 Testicular hypofunction: Secondary | ICD-10-CM | POA: Diagnosis not present

## 2021-04-14 DIAGNOSIS — E291 Testicular hypofunction: Secondary | ICD-10-CM | POA: Diagnosis not present

## 2021-04-19 ENCOUNTER — Telehealth: Payer: Self-pay | Admitting: Cardiovascular Disease

## 2021-04-19 MED ORDER — METOPROLOL TARTRATE 25 MG PO TABS: 12.5000 mg | ORAL_TABLET | Freq: Two times a day (BID) | ORAL | 1 refills | Status: DC

## 2021-04-19 NOTE — Telephone Encounter (Signed)
*  STAT* If patient is at the pharmacy, call can be transferred to refill team.   1. Which medications need to be refilled? (please list name of each medication and dose if known) metoprolol tartrate (LOPRESSOR) 25 MG tablet  2. Which pharmacy/location (including street and city if local pharmacy) is medication to be sent to? [id number] [place], [place] - [address]  3. Do they need a 30 day or 90 day supply? 90 day supply  Pt is out of the medication

## 2021-04-27 ENCOUNTER — Telehealth: Payer: Self-pay | Admitting: Cardiovascular Disease

## 2021-04-27 DIAGNOSIS — I10 Essential (primary) hypertension: Secondary | ICD-10-CM

## 2021-04-27 DIAGNOSIS — E785 Hyperlipidemia, unspecified: Secondary | ICD-10-CM

## 2021-04-27 DIAGNOSIS — E349 Endocrine disorder, unspecified: Secondary | ICD-10-CM | POA: Diagnosis not present

## 2021-04-27 NOTE — Telephone Encounter (Signed)
CMET and fasting Lipid lab ordered for the patient. He will have them done one week before his appointment with [name]

## 2021-04-27 NOTE — Telephone Encounter (Signed)
   Pt said normally get blood work 1 week before his appt with [name], there's no order on file

## 2021-05-05 DIAGNOSIS — E349 Endocrine disorder, unspecified: Secondary | ICD-10-CM | POA: Diagnosis not present

## 2021-05-05 DIAGNOSIS — Z8551 Personal history of malignant neoplasm of bladder: Secondary | ICD-10-CM | POA: Diagnosis not present

## 2021-05-05 DIAGNOSIS — R310 Gross hematuria: Secondary | ICD-10-CM | POA: Diagnosis not present

## 2021-05-18 DIAGNOSIS — N200 Calculus of kidney: Secondary | ICD-10-CM | POA: Diagnosis not present

## 2021-05-18 DIAGNOSIS — E785 Hyperlipidemia, unspecified: Secondary | ICD-10-CM | POA: Diagnosis not present

## 2021-05-18 DIAGNOSIS — I10 Essential (primary) hypertension: Secondary | ICD-10-CM | POA: Diagnosis not present

## 2021-05-18 DIAGNOSIS — C670 Malignant neoplasm of trigone of bladder: Secondary | ICD-10-CM | POA: Diagnosis not present

## 2021-05-18 DIAGNOSIS — R310 Gross hematuria: Secondary | ICD-10-CM | POA: Diagnosis not present

## 2021-05-18 DIAGNOSIS — K8020 Calculus of gallbladder without cholecystitis without obstruction: Secondary | ICD-10-CM | POA: Diagnosis not present

## 2021-05-18 DIAGNOSIS — K429 Umbilical hernia without obstruction or gangrene: Secondary | ICD-10-CM | POA: Diagnosis not present

## 2021-05-18 LAB — COMPREHENSIVE METABOLIC PANEL
ALT: 29 IU/L (ref 0–44)
AST: 19 IU/L (ref 0–40)
Albumin/Globulin Ratio: 2 (ref 1.2–2.2)
Albumin: 4.4 g/dL (ref 3.8–4.8)
Alkaline Phosphatase: 59 IU/L (ref 44–121)
BUN/Creatinine Ratio: 15 (ref 10–24)
BUN: 19 mg/dL (ref 8–27)
Bilirubin Total: 1.3 mg/dL — ABNORMAL HIGH (ref 0.0–1.2)
CO2: 20 mmol/L (ref 20–29)
Calcium: 9 mg/dL (ref 8.6–10.2)
Chloride: 104 mmol/L (ref 96–106)
Creatinine, Ser: 1.28 mg/dL — ABNORMAL HIGH (ref 0.76–1.27)
Globulin, Total: 2.2 g/dL (ref 1.5–4.5)
Glucose: 96 mg/dL (ref 65–99)
Potassium: 4.7 mmol/L (ref 3.5–5.2)
Sodium: 137 mmol/L (ref 134–144)
Total Protein: 6.6 g/dL (ref 6.0–8.5)
eGFR: 60 mL/min/{1.73_m2} (ref >59)

## 2021-05-18 LAB — LIPID PANEL
Chol/HDL Ratio: 3.3 ratio (ref 0.0–5.0)
Cholesterol, Total: 124 mg/dL (ref 100–199)
HDL: 38 mg/dL — ABNORMAL LOW (ref >39)
LDL Chol Calc (NIH): 62 mg/dL (ref 0–99)
Triglycerides: 133 mg/dL (ref 0–149)
VLDL Cholesterol Cal: 24 mg/dL (ref 5–40)

## 2021-05-27 ENCOUNTER — Other Ambulatory Visit: Payer: Self-pay

## 2021-05-27 ENCOUNTER — Encounter: Payer: Self-pay | Admitting: Cardiovascular Disease

## 2021-05-27 ENCOUNTER — Ambulatory Visit: Payer: PPO | Admitting: Cardiovascular Disease

## 2021-05-27 VITALS — BP 120/88 | HR 56 | Ht 69.5 in | Wt 242.0 lb

## 2021-05-27 DIAGNOSIS — I2510 Atherosclerotic heart disease of native coronary artery without angina pectoris: Secondary | ICD-10-CM | POA: Diagnosis not present

## 2021-05-27 DIAGNOSIS — I10 Essential (primary) hypertension: Secondary | ICD-10-CM

## 2021-05-27 DIAGNOSIS — Z8551 Personal history of malignant neoplasm of bladder: Secondary | ICD-10-CM

## 2021-05-27 DIAGNOSIS — E785 Hyperlipidemia, unspecified: Secondary | ICD-10-CM | POA: Diagnosis not present

## 2021-05-27 DIAGNOSIS — E668 Other obesity: Secondary | ICD-10-CM

## 2021-05-27 DIAGNOSIS — Z9861 Coronary angioplasty status: Secondary | ICD-10-CM

## 2021-05-27 DIAGNOSIS — I4510 Unspecified right bundle-branch block: Secondary | ICD-10-CM

## 2021-05-27 NOTE — Patient Instructions (Signed)

## 2021-05-27 NOTE — Progress Notes (Signed)
Cardiology Office Note    Date:  05/27/2021   ID:  Ryan Scott, Ryan Scott [birth year], MRN [id number]  PCP:  [name], MD  Cardiologist:  Shelva Majestic, MD   12 month F/U evaluation  History of Present Illness:  Ryan Scott is a 70 y.o. male who presents for a 12  month follow-up cardiology evaluation.  Ryan Scott had remotely seen Dr. Rollene Fare.  He was seen in March 2019 by Jory Sims, NP referred by Dr. Harlene Ramus after his CT of his chest healed significant coronary calcification.  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  He had noticed more shortness of breath but denied any significant chest pain symptomatology.  With his symptoms and CT scan, he was referred for cardiac catheterization.  On December 19, 2017 cardiac catheterization performed by me showed low normal LV function with an EF of 50 to 55%.  There was significant coronary calcification 20 to 30% proximal LAD stenosis and then 80% stenosis after the first diagonal with 80% distal stenosis.  Circumflex had 60% focal OM1 stenosis prior to bifurcation and the RCA was significantly calcified with 50% stenosis in the proximal bend of the vessel and diffuse 70, 80, and 95% stenosis beyond the acute margin extending up to the takeoff of the PDA.  The PDA and PLA were collateralized via the distal circumflex.  He underwent a difficult but successful PCI to the calcified LAD with ultimate insertion of a 3.0 x 16 mm Synergy DES stent to the proximal lesion PTCA of the distal LAD.  Subsequently, he was on aspirin and Brilinta and had been on isosorbide 30 mg in addition to metoprolol 12.5 mg twice a day.  He also has been on Zetia 10 mg in addition to Livalo reportedly at 1 mg daily.  He has experienced shortness of breath really when he lies down.  He denies any significant volume issues.  He has noticed some slight improvement with dyspnea following coffee.  I saw him for initial  evaluation following his hospitalization in July 2019.  Due to potential Brilinta induced dyspnea he was switched to clopidogrel 75 mg daily.  Subsequent P2 Y12 testing adjusted possible hyper responsiveness at 7 PR U.  Subsequently, his shortness of breath has improved.   I last saw him in November 2019.  He was unaware of any excess bleeding.  He continues to be active and is able to do anything he can without chest pain or shortness of breath.  He underwent went repeat laboratory which showed a total cholesterol of 111 triglycerides 74 HDL 41 and LDL cholesterol 55 while on combination Zetia and Livalo.  He was concerned about the cost of Livalo and since he was unaware of any issues with other statins I recommended changing to atorvastatin.   I saw him in August 2020.  Since his prior evaluation he had been stable and denied any recurrent chest pain or anginal symptomatology.  He had initially lost about 25 pounds following his coronary event but unfortunately due to the to the Covid pandemic began to develop increasing weight.    I last saw him in September 2021.  Since his prior evaluation he continued to do well and was without chest pain or shortness of breath.   He suffered a stress fracture in his left foot and has been in the boot for the past 4 to 5 weeks.  He admits to further weight gain.  Since I last saw him, he underwent successful right shoulder surgery by Dr. Mardelle Matte in January 2022.  He was given preoperative clearance and had held his Plavix for 5 days prior to the surgery and tolerated surgery well.  He also has remote history of bladder cancer and undergoes every 5-year CT imaging and cystoscopy.  Most recently he has been walking 2-1/2 miles per day 6 days/week.  Over this weekend he tripped over his cat and sprained his knee and for the last several days has not been walking.  He is unaware of any palpitations.  He has not been successful with recent weight loss.  He presents for  evaluation.  Past Medical History:  Diagnosis Date   Arthritis    "back, neck, shoulders" (12/19/2017)   Bladder cancer (HCC)    Borderline hypertension    BPH (benign prostatic hyperplasia)    Chronic lower back pain    ED (erectile dysfunction)    High cholesterol    "started BP RX 12/18/2017"   History of adenomatous polyp of colon    tubular adenoma   History of kidney stones    Hypertension    Hypogonadism male    Left ureteral stone    Lower urinary tract symptoms (LUTS)    RBBB (right bundle branch block)     Past Surgical History:  Procedure Laterality Date   CARDIOVASCULAR STRESS TEST  10-16-2012   normal myocardial perfusion study,  no ischemia/  not gated   COLONOSCOPY  last one 07-26-2013   CORONARY STENT INTERVENTION N/A 12/19/2017   Procedure: CORONARY STENT INTERVENTION;  Surgeon: [name], MD;  Location: [facility];  Service: Cardiovascular;  Laterality: N/A;   CYSTOSCOPY/RETROGRADE/URETEROSCOPY/STONE EXTRACTION WITH BASKET  1989   LEFT HEART CATH AND CORONARY ANGIOGRAPHY N/A 12/19/2017   Procedure: LEFT HEART CATH AND CORONARY ANGIOGRAPHY;  Surgeon: [name], MD;  Location: [facility];  Service: Cardiovascular;  Laterality: N/A;  LAD    PROSTATE SURGERY  2002   PROSTATE SURGERY  2002   REFRACTIVE SURGERY Bilateral    SHOULDER ARTHROSCOPY WITH SUBACROMIAL DECOMPRESSION, ROTATOR CUFF REPAIR AND BICEP TENDON REPAIR Left 2013   TONSILLECTOMY  ~ 1956   TRANSTHORACIC ECHOCARDIOGRAM  10-16-2012   normal LVF, ef 55-65%,  mild AV sclerosis without stenosis,  mild MR and TR,  mild LAE,  trivial PR   TRANSURETHRAL RESECTION OF BLADDER TUMOR WITH GYRUS (TURBT-GYRUS)  1989   and URETEROSCOPIC STONE EXTRACTION    Current Medications: Outpatient Medications Prior to Visit  Medication Sig Dispense Refill   allopurinol (ZYLOPRIM) 100 MG tablet Take 100 mg by mouth daily.     aspirin 81 MG chewable tablet Chew 1 tablet (81 mg total) by mouth  daily.     atorvastatin (LIPITOR) 40 MG tablet TAKE 1 TABLET BY MOUTH EVERY DAY 90 tablet 2   clopidogrel (PLAVIX) 75 MG tablet TAKE 1 TABLET BY MOUTH EVERY DAY 90 tablet 3   ezetimibe (ZETIA) 10 MG tablet TAKE 1 TABLET BY MOUTH EVERY DAY 90 tablet 4   isosorbide mononitrate (IMDUR) 30 MG 24 hr tablet TAKE 1/2 TABLET (15 MG TOTAL) BY MOUTH DAILY. 45 tablet 1   metoprolol tartrate (LOPRESSOR) 25 MG tablet Take 0.5 tablets (12.5 mg total) by mouth 2 (two) times daily. 90 tablet 1   testosterone cypionate (DEPOTESTOTERONE CYPIONATE) 200 MG/ML injection Inject 200 mg into the muscle every 28 (twenty-eight) days.      nitroGLYCERIN (NITROSTAT) 0.4 MG  SL tablet Place 1 tablet (0.4 mg total) under the tongue every 5 (five) minutes as needed for chest pain. 25 tablet 3   No facility-administered medications prior to visit.     Allergies:   Patient has no known allergies.   Social History   Socioeconomic History   Marital status: Married    Spouse name: Not on file   Number of children: Not on file   Years of education: Not on file   Highest education level: Not on file  Occupational History   Not on file  Tobacco Use   Smoking status: Never   Smokeless tobacco: Former    Types: Chew    Quit date: 1991  Vaping Use   Vaping Use: Never used  Substance and Sexual Activity   Alcohol use: Yes    Alcohol/week: 2.0 standard drinks    Types: 2 Standard drinks or equivalent per week   Drug use: Not Currently   Sexual activity: Not on file  Other Topics Concern   Not on file  Social History Narrative   Not on file   Social Determinants of Health   Financial Resource Strain: Not on file  Food Insecurity: Not on file  Transportation Needs: Not on file  Physical Activity: Not on file  Stress: Not on file  Social Connections: Not on file     Family History:  The patient's family history includes Aneurysm in his paternal grandfather; Arthritis in his father and sister; Breast cancer in  his mother; Heart attack in his father; Heart disease in his father; Hypertension in his father; Lung cancer in his maternal grandfather; Other in his paternal grandmother; Stroke in his maternal grandmother; Thyroid disease in his mother.   ROS General: Negative; No fevers, chills, or night sweats;  HEENT: Negative; No changes in vision or hearing, sinus congestion, difficulty swallowing Pulmonary: Mild shortness of breath.  Possible silicosis, not definitively diagnosed Cardiovascular: Negative; No chest pain, presyncope, syncope, palpitations GI: Negative; No nausea, vomiting, diarrhea, or abdominal pain GU: History of bladder cancer, undergoing every 5-year CT imaging and cystoscopy. Musculoskeletal: Left foot fracture he was in a boot for 4 months at the end of 2021.  Status post right rotator cuff surgery January 2022. Hematologic/Oncology: Negative; no easy bruising, bleeding Endocrine: Negative; no heat/cold intolerance; no diabetes Neuro: Negative; no changes in balance, headaches Skin: Negative; No rashes or skin lesions Psychiatric: Negative; No behavioral problems, depression Sleep: Negative; No snoring, daytime sleepiness, hypersomnolence, bruxism, restless legs, hypnogognic hallucinations, no cataplexy Other comprehensive 14 point system review is negative.   PHYSICAL EXAM:   VS:  BP 120/88 (BP Location: Right Arm)   Pulse (!) 56   Ht 5' 9.5" (1.765 m)   Wt 242 lb (109.8 kg)   SpO2 94%   BMI 35.22 kg/m     Repeat blood pressure by me 130/78.  He states his blood pressure at home typically runs around 120/80.  Wt Readings from Last 3 Encounters:  05/27/21 242 lb (109.8 kg)  06/19/20 238 lb (108 kg)  05/24/19 230 lb 9.6 oz (104.6 kg)   General: Alert, oriented, no distress.  Skin: normal turgor, no rashes, warm and dry HEENT: Normocephalic, atraumatic. Pupils equal round and reactive to light; sclera anicteric; extraocular muscles intact  Nose without nasal septal  hypertrophy Mouth/Parynx benign; Mallinpatti scale 3 Neck: No JVD, no carotid bruits; normal carotid upstroke Lungs: clear to ausculatation and percussion; no wheezing or rales Chest wall: without tenderness to palpitation Heart:  PMI not displaced, RRR, s1 s2 normal, 1/6 systolic murmur, no diastolic murmur, no rubs, gallops, thrills, or heaves Abdomen: soft, nontender; no hepatosplenomehaly, BS+; abdominal aorta nontender and not dilated by palpation. Back: no CVA tenderness Pulses 2+ Musculoskeletal: full range of motion, normal strength, no joint deformities Extremities: Trivial right ankle swelling; no clubbing cyanosis, Homan's sign negative  Neurologic: grossly nonfocal; Cranial nerves grossly wnl Psychologic: Normal mood and affect   Studies/Labs Reviewed:   EKG:  EKG is  ordered today. ECG (independently read by me): Sinus bradycardia 56 bpm, first-degree block with PR 212 msec.  Borderline RBBB, QRS 120 msec  June 19, 2020 ECG (independently read by me): Sinus rhythm with frequent PACs, right bundle branch block borderline first-degree block with PR interval 202  ms.  QS complex in V3.   August 2020 ECG (independently read by me): Sinus bradycardia 56 bpm, incomplete right bundle branch block.  Borderline first-degree AV block with a PR interval 2 8 ms.  Small inferior Q waves  November 2019 ECG (independently read by me): Sinus bradycardia 52 bpm.  First-degree AV block.  Right bundle branch block.  July 2019 ECG (independently read by me): Sinus bradycardia at 50 bpm.  Mild first-degree AV block with a PR interval of 218 ms.  Incomplete right bundle branch block.  Recent Labs: BMP Latest Ref Rng & Units 05/18/2021 06/08/2020 05/17/2019  Glucose 65 - 99 mg/dL 96 91 91  BUN 8 - 27 mg/dL _0 Creatinine 0.76 - 1.27 mg/dL 1.28(H) 1.24 1.26  BUN/Creat Ratio 10 - _1 Sodium 134 - 144 mmol/L 137 138 139  Potassium 3.5 - 5.2 mmol/L 4.7 4.7 4.4  Chloride 96 - 106  mmol/L 104 104 102  CO2 20 - 29 mmol/L _2 Calcium 8.6 - 10.2 mg/dL 9.0 9.2 9.4     Hepatic Function Latest Ref Rng & Units 05/18/2021 06/08/2020 05/17/2019  Total Protein 6.0 - 8.5 g/dL 6.6 6.7 6.7  Albumin 3.8 - 4.8 g/dL 4.4 4.6 4.5  AST 0 - 40 IU/L _3 ALT 0 - 44 IU/L 29 32 30  Alk Phosphatase 44 - 121 IU/L 59 60 56  Total Bilirubin 0.0 - 1.2 mg/dL 1.3(H) 2.5(H) 2.1(H)  Bilirubin, Direct 0.00 - 0.40 mg/dL - - -    CBC Latest Ref Rng & Units 12/20/2017 12/18/2017 05/16/2013  WBC 4.0 - 10.5 K/uL 8.8 6.9 6.9  Hemoglobin 13.0 - 17.0 g/dL 14.8 17.0 17.2(H)  Hematocrit 39.0 - 52.0 % 44.7 49.8 49.9  Platelets 150 - 400 K/uL 190 236 231   Lab Results  Component Value Date   MCV 90.1 12/20/2017   MCV 89 12/18/2017   MCV 87.1 05/16/2013   No results found for: TSH No results found for: HGBA1C   BNP No results found for: BNP  ProBNP No results found for: PROBNP   Lipid Panel     Component Value Date/Time   CHOL 124 05/18/2021 0954   TRIG 133 05/18/2021 0954   HDL 38 (L) 05/18/2021 0954   CHOLHDL 3.3 05/18/2021 0954   LDLCALC 62 05/18/2021 0954     RADIOLOGY: No results found.   Additional studies/ records that were reviewed today include:   CT CHEST: IMPRESSION: 1. Two right middle lobe pulmonary nodules are unchanged since 2016 and benign. No additional imaging follow-up is necessary. 2. No acute pulmonary findings. Mild bilateral middle lobe and lower lobe scarring/atelectasis. 3. Extensive coronary artery  calcified atherosclerosis and/or stents.  ------------------------------------------------------------------------------------------------------  December 19, 2017 Cardiac Catheterization/PCI:  Low normal global LV function with an EF of 50-55% without definitive focal segmental wall motion abnormalities.   Significant coronary calcification with vessel CAD.  The LAD had proximal 20-30% stenosis and a calcified segment and then following a sharp  and had 80% stenosis after the first diagonal vessel and 80% distal stenosis;  60% focal OM1 stenosis prior to a bifurcation; calcified RCA with possible 50% stenosis on a proximal bend in the vessel.  There is diffuse 70-80-95% stenoses  beyond the acute margin extending up to the takeoff of the PDA.  There is collateralization of the PDA and PLA vessels via the distal circumflex.   Difficult but successful PCI to the calcified LAD with ultimate insertion of a 3.016 mm Synergy DES stent to the proximal lesion with the 80% stenosis being reduced to 0%, and PTCA of the distal LAD lesion with the 80% stenosis being reduced to less than 5% utilizing bivalirudin and aspirin/Brilinta.   RECOMMENDATION: DAPT for a mininum of one year. Following this initial procedure he will be started on anti-ischemic medical therapy including beta blocker and nitrates.  Will review angiograms with colleagues.  The distal RCA is collateralized.  Consider attempt at initial medical therapy of the diffusely diseased distal calcified RCA versus staged  PCI.    ASSESSMENT:    1. Coronary artery disease involving native coronary artery of native heart without angina pectoris   2. CAD S/P percutaneous coronary intervention: December 19, 2017, DES stent to proximal LAD and PTCA of the distal LAD, on medical therapy for concomitant CAD   3. Hyperlipidemia with target LDL less than 70   4. Essential hypertension   5. Right bundle branch block   6. Moderate obesity   7. History of bladder cancer     PLAN:  Ryan Scott is a 70 year-old gentleman who has a history of mild hypertension, hyperlipidemia, and has documented stable pulmonary nodules and extensive coronary calcification.  He was found to have multivessel CAD and underwent successful stenting of a calcified proximal LAD stenosis with PTCA of the distal LAD stenosis in March 2019.  At that time he had diffuse disease in his RCA which was significantly narrowed  distally but there was good collateralization to the distal RCA from the circumflex territory.  Since his coronary intervention he has done well.  Presently, he has been without recurrent anginal symptomatology.  He had developed dyspnea related to Brilinta and continues to be on DAPT with aspirin/clopidogrel.  I performed a P2 Y 12 test which suggested possible hyperresponsiveness with the*17 allele.  Over the past year, he tolerated right shoulder surgery without cardiovascular compromise.  He has not been successful with weight loss.  Compared to his last evaluation he has gained 4 pounds.  Presently he is walking 2 to 2-1/2 miles per day typically 6 days/week.  He continues to be on isosorbide 15 mg of metoprolol tartrate 12.5 mg twice a day.  He is on atorvastatin 40 mg for hyperlipidemia.  LDL cholesterol on May 18, 2021 was 62 with triglycerides 133 total cholesterol 124 and HDL was low at 38.  Moderately obese.  Weight loss was recommended.  By Dr. Junious Silk for urology and is undergoing CT imaging and 5-year follow-up cystoscopy for his remote bladder cancer.  Cardiac wise he is stable.  I will see him in 1 year for reevaluation or sooner as needed.   Medication  Adjustments/Labs and Tests Ordered: Current medicines are reviewed at length with the patient today.  Concerns regarding medicines are outlined above.  Medication changes, Labs and Tests ordered today are listed in the Patient Instructions below. Patient Instructions  Medication Instructions:  The current medical regimen is effective;  continue present plan and medications.  *If you need a refill on your cardiac medications before your next appointment, please call your pharmacy*  Follow-Up: At [facility], you and your health needs are our priority.  As part of our continuing mission to provide you with exceptional heart care, we have created designated Provider Care Teams.  These Care Teams include your primary Cardiologist  (physician) and Advanced Practice Providers (APPs -  Physician Assistants and Nurse Practitioners) who all work together to provide you with the care you need, when you need it.  We recommend signing up for the patient portal called "MyChart".  Sign up information is provided on this After Visit Summary.  MyChart is used to connect with patients for Virtual Visits (Telemedicine).  Patients are able to view lab/test results, encounter notes, upcoming appointments, etc.  Non-urgent messages can be sent to your provider as well.   To learn more about what you can do with MyChart, go to [email or web address].    Your next appointment:   12 month(s)  The format for your next appointment:   In Person  Provider:   Shelva Majestic, MD      Signed, Shelva Majestic, MD  05/27/2021 3:41 PM    [facility] [address], [place], [place], [place]  [address] Phone: [phone]

## 2021-05-28 DIAGNOSIS — E349 Endocrine disorder, unspecified: Secondary | ICD-10-CM | POA: Diagnosis not present

## 2021-05-28 NOTE — Addendum Note (Signed)
Addended by: Jacqulynn Cadet on: 05/28/2021 04:57 PM   Modules accepted: Orders

## 2021-06-04 DIAGNOSIS — M25562 Pain in left knee: Secondary | ICD-10-CM | POA: Diagnosis not present

## 2021-06-25 DIAGNOSIS — E291 Testicular hypofunction: Secondary | ICD-10-CM | POA: Diagnosis not present

## 2021-06-25 DIAGNOSIS — N200 Calculus of kidney: Secondary | ICD-10-CM | POA: Diagnosis not present

## 2021-06-25 DIAGNOSIS — R310 Gross hematuria: Secondary | ICD-10-CM | POA: Diagnosis not present

## 2021-07-02 DIAGNOSIS — M25562 Pain in left knee: Secondary | ICD-10-CM | POA: Diagnosis not present

## 2021-07-21 ENCOUNTER — Other Ambulatory Visit: Payer: Self-pay | Admitting: Cardiovascular Disease

## 2021-07-22 DIAGNOSIS — M25562 Pain in left knee: Secondary | ICD-10-CM | POA: Diagnosis not present

## 2021-07-27 DIAGNOSIS — E291 Testicular hypofunction: Secondary | ICD-10-CM | POA: Diagnosis not present

## 2021-07-30 DIAGNOSIS — S83242A Other tear of medial meniscus, current injury, left knee, initial encounter: Secondary | ICD-10-CM | POA: Diagnosis not present

## 2021-08-07 ENCOUNTER — Other Ambulatory Visit: Payer: Self-pay | Admitting: Cardiovascular Disease

## 2021-08-26 HISTORY — PX: KNEE SURGERY: SHX244

## 2021-08-29 ENCOUNTER — Other Ambulatory Visit: Payer: Self-pay | Admitting: Cardiovascular Disease

## 2021-08-31 DIAGNOSIS — E291 Testicular hypofunction: Secondary | ICD-10-CM | POA: Diagnosis not present

## 2021-09-09 DIAGNOSIS — X58XXXA Exposure to other specified factors, initial encounter: Secondary | ICD-10-CM | POA: Diagnosis not present

## 2021-09-09 DIAGNOSIS — S83232A Complex tear of medial meniscus, current injury, left knee, initial encounter: Secondary | ICD-10-CM | POA: Diagnosis not present

## 2021-09-09 DIAGNOSIS — M948X6 Other specified disorders of cartilage, lower leg: Secondary | ICD-10-CM | POA: Diagnosis not present

## 2021-09-09 DIAGNOSIS — S83282A Other tear of lateral meniscus, current injury, left knee, initial encounter: Secondary | ICD-10-CM | POA: Diagnosis not present

## 2021-09-09 DIAGNOSIS — Y999 Unspecified external cause status: Secondary | ICD-10-CM | POA: Diagnosis not present

## 2021-09-09 DIAGNOSIS — S83242A Other tear of medial meniscus, current injury, left knee, initial encounter: Secondary | ICD-10-CM | POA: Diagnosis not present

## 2021-09-09 DIAGNOSIS — S83272A Complex tear of lateral meniscus, current injury, left knee, initial encounter: Secondary | ICD-10-CM | POA: Diagnosis not present

## 2021-09-15 ENCOUNTER — Other Ambulatory Visit: Payer: Self-pay | Admitting: Cardiovascular Disease

## 2021-09-22 DIAGNOSIS — S83282D Other tear of lateral meniscus, current injury, left knee, subsequent encounter: Secondary | ICD-10-CM | POA: Diagnosis not present

## 2021-09-22 DIAGNOSIS — S83242D Other tear of medial meniscus, current injury, left knee, subsequent encounter: Secondary | ICD-10-CM | POA: Diagnosis not present

## 2021-09-28 DIAGNOSIS — E291 Testicular hypofunction: Secondary | ICD-10-CM | POA: Diagnosis not present

## 2021-10-27 DIAGNOSIS — E291 Testicular hypofunction: Secondary | ICD-10-CM | POA: Diagnosis not present

## 2021-11-18 ENCOUNTER — Other Ambulatory Visit: Payer: Self-pay

## 2021-11-18 MED ORDER — METOPROLOL TARTRATE 25 MG PO TABS: ORAL_TABLET | ORAL | 1 refills | Status: DC

## 2021-11-24 DIAGNOSIS — E349 Endocrine disorder, unspecified: Secondary | ICD-10-CM | POA: Diagnosis not present

## 2021-12-22 DIAGNOSIS — Z23 Encounter for immunization: Secondary | ICD-10-CM | POA: Diagnosis not present

## 2021-12-22 DIAGNOSIS — N400 Enlarged prostate without lower urinary tract symptoms: Secondary | ICD-10-CM | POA: Diagnosis not present

## 2021-12-22 DIAGNOSIS — Z87442 Personal history of urinary calculi: Secondary | ICD-10-CM | POA: Diagnosis not present

## 2021-12-22 DIAGNOSIS — Z1211 Encounter for screening for malignant neoplasm of colon: Secondary | ICD-10-CM | POA: Diagnosis not present

## 2021-12-22 DIAGNOSIS — Z8551 Personal history of malignant neoplasm of bladder: Secondary | ICD-10-CM | POA: Diagnosis not present

## 2021-12-22 DIAGNOSIS — Z0000 Encounter for general adult medical examination without abnormal findings: Secondary | ICD-10-CM | POA: Diagnosis not present

## 2021-12-22 DIAGNOSIS — I10 Essential (primary) hypertension: Secondary | ICD-10-CM | POA: Diagnosis not present

## 2021-12-22 DIAGNOSIS — E7800 Pure hypercholesterolemia, unspecified: Secondary | ICD-10-CM | POA: Diagnosis not present

## 2021-12-22 DIAGNOSIS — I2510 Atherosclerotic heart disease of native coronary artery without angina pectoris: Secondary | ICD-10-CM | POA: Diagnosis not present

## 2021-12-28 DIAGNOSIS — E349 Endocrine disorder, unspecified: Secondary | ICD-10-CM | POA: Diagnosis not present

## 2022-01-23 ENCOUNTER — Other Ambulatory Visit: Payer: Self-pay | Admitting: Cardiovascular Disease

## 2022-01-25 DIAGNOSIS — E291 Testicular hypofunction: Secondary | ICD-10-CM | POA: Diagnosis not present

## 2022-01-26 DIAGNOSIS — H8110 Benign paroxysmal vertigo, unspecified ear: Secondary | ICD-10-CM | POA: Diagnosis not present

## 2022-02-09 ENCOUNTER — Telehealth: Payer: Self-pay | Admitting: Cardiovascular Disease

## 2022-02-09 NOTE — Telephone Encounter (Signed)
New Message: ? ? ? ? ?Patient wants to know if he needs lab work before he comes in for his yearly check up in October? If so , please order it and call and let him know please. ?

## 2022-02-09 NOTE — Telephone Encounter (Signed)
Returned call to pt he will call back the end of sept to discuss ?

## 2022-03-01 DIAGNOSIS — E291 Testicular hypofunction: Secondary | ICD-10-CM | POA: Diagnosis not present

## 2022-03-07 DIAGNOSIS — L089 Local infection of the skin and subcutaneous tissue, unspecified: Secondary | ICD-10-CM | POA: Diagnosis not present

## 2022-03-17 DIAGNOSIS — L720 Epidermal cyst: Secondary | ICD-10-CM | POA: Diagnosis not present

## 2022-03-18 ENCOUNTER — Other Ambulatory Visit: Payer: Self-pay | Admitting: Cardiovascular Disease

## 2022-03-22 ENCOUNTER — Encounter: Payer: Self-pay | Admitting: Gastroenterology

## 2022-04-05 DIAGNOSIS — E291 Testicular hypofunction: Secondary | ICD-10-CM | POA: Diagnosis not present

## 2022-04-26 DIAGNOSIS — E291 Testicular hypofunction: Secondary | ICD-10-CM | POA: Diagnosis not present

## 2022-05-05 ENCOUNTER — Other Ambulatory Visit: Payer: Self-pay | Admitting: Gastroenterology

## 2022-05-05 ENCOUNTER — Encounter: Payer: Self-pay | Admitting: Gastroenterology

## 2022-05-05 ENCOUNTER — Telehealth: Payer: Self-pay

## 2022-05-05 ENCOUNTER — Ambulatory Visit: Payer: PPO | Admitting: Gastroenterology

## 2022-05-05 VITALS — BP 106/80 | HR 53 | Ht 69.0 in | Wt 240.4 lb

## 2022-05-05 DIAGNOSIS — Z86010 Personal history of colonic polyps: Secondary | ICD-10-CM

## 2022-05-05 DIAGNOSIS — Z7902 Long term (current) use of antithrombotics/antiplatelets: Secondary | ICD-10-CM

## 2022-05-05 MED ORDER — PLENVU 140 G PO SOLR: 140.0000 g | ORAL | 0 refills | Status: DC

## 2022-05-05 NOTE — Patient Instructions (Signed)
_______________________________________________________  If you are age 72 or older, your body mass index should be between 23-30. Your Body mass index is 35.5 kg/m. If this is out of the aforementioned range listed, please consider follow up with your Primary Care Provider.  If you are age 58 or younger, your body mass index should be between 19-25. Your Body mass index is 35.5 kg/m. If this is out of the aformentioned range listed, please consider follow up with your Primary Care Provider.   ________________________________________________________  The [facility] providers would like to encourage you to use [facility] to communicate with providers for non-urgent requests or questions.  Due to long hold times on the telephone, sending your provider a message by [facility] may be a faster and more efficient way to get a response.  Please allow 48 business hours for a response.  Please remember that this is for non-urgent requests.  _______________________________________________________  Ryan Scott have been scheduled for a colonoscopy. Please follow written instructions given to you at your visit today.  Please pick up your prep supplies at the pharmacy within the next 1-3 days. If you use inhalers (even only as needed), please bring them with you on the day of your procedure.  Due to recent changes in healthcare laws, you may see the results of your imaging and laboratory studies on MyChart before your provider has had a chance to review them.  We understand that in some cases there may be results that are confusing or concerning to you. Not all laboratory results come back in the same time frame and the provider may be waiting for multiple results in order to interpret others.  Please give [place] 48 hours in order for your provider to thoroughly review all the results before contacting the office for clarification of your results.   It was a pleasure to see you today!  Thank you for trusting me with your  gastrointestinal care!

## 2022-05-05 NOTE — Telephone Encounter (Signed)
Pharmacy note indicates Plenvu is not covered.  Please use generic Suprep.  HD

## 2022-05-05 NOTE — Progress Notes (Signed)
[facility] Consult Note:  History: Ryan Scott 05/05/2022  Referring provider: [name], MD  Reason for consult/chief complaint: Colonoscopy   Subjective  HPI:  This is a very pleasant 71 year old man here for medical evaluation before planned surveillance colonoscopy.  Screening colonoscopy with Dr. Olevia Perches October 2014 - -diminutive tubular adenoma removed with a cold biopsy. EGD with me February 2019 for early satiety and bloating.  Gastritis discovered, biopsies negative for H. pylori.  Suspected to be due to meloxicam.  He continues to have intermittent abdominal bloating, and finds that he often belches during his two 3 mile daily walk, and thinks he may be swallowing some air. Bowel habits are regular without loose or hard stools and he denies rectal bleeding.  Appetite normal and weight stable.  He does not experience chest pain or dyspnea, including during his daily walk, and he is scheduled to see cardiology in early October for routine follow-up. Of note, he has been off his Plavix several days prior to 2 or 3 orthopedic procedures since the coronary stent was placed.  ROS:  Review of Systems  Constitutional:  Negative for appetite change and unexpected weight change.  HENT:  Negative for mouth sores and voice change.   Eyes:  Negative for pain and redness.  Respiratory:  Negative for cough and shortness of breath.   Cardiovascular:  Negative for chest pain and palpitations.  Genitourinary:  Negative for dysuria and hematuria.  Musculoskeletal:  Positive for arthralgias and back pain. Negative for myalgias.  Skin:  Negative for pallor and rash.  Neurological:  Negative for weakness and headaches.  Hematological:  Negative for adenopathy.     Past Medical History: Past Medical History:  Diagnosis Date   Arthritis    "back, neck, shoulders" (12/19/2017)   Bladder cancer (HCC)    Borderline hypertension    BPH (benign  prostatic hyperplasia)    Chronic lower back pain    ED (erectile dysfunction)    High cholesterol    "started BP RX 12/18/2017"   History of adenomatous polyp of colon    tubular adenoma   History of kidney stones    Hypertension    Hypogonadism male    Left ureteral stone    Lower urinary tract symptoms (LUTS)    RBBB (right bundle branch block)    Last cardiology office note of September 2022 details patient's history of multivessel coronary disease with placement of DES to the LAD in March 2019.  At that most recent visit, this patient was reportedly without angina and was on DAPT.  Past Surgical History: Past Surgical History:  Procedure Laterality Date   CARDIOVASCULAR STRESS TEST  10/16/2012   normal myocardial perfusion study,  no ischemia/  not gated   COLONOSCOPY  last one 07-26-2013   CORONARY STENT INTERVENTION N/A 12/19/2017   Procedure: CORONARY STENT INTERVENTION;  Surgeon: [name], MD;  Location: [facility];  Service: Cardiovascular;  Laterality: N/A;   CYSTOSCOPY/RETROGRADE/URETEROSCOPY/STONE EXTRACTION WITH BASKET  1989   KNEE SURGERY Left 08/2021   LEFT HEART CATH AND CORONARY ANGIOGRAPHY N/A 12/19/2017   Procedure: LEFT HEART CATH AND CORONARY ANGIOGRAPHY;  Surgeon: [name], MD;  Location: [facility];  Service: Cardiovascular;  Laterality: N/A;  LAD    PROSTATE SURGERY  2002   PROSTATE SURGERY  2002   REFRACTIVE SURGERY Bilateral    SHOULDER ARTHROSCOPY WITH SUBACROMIAL DECOMPRESSION, ROTATOR CUFF REPAIR AND BICEP TENDON REPAIR Left 2013  SHOULDER SURGERY  09/2020   rotator cuff surgery   TONSILLECTOMY  ~ 1956   TRANSTHORACIC ECHOCARDIOGRAM  10/16/2012   normal LVF, ef 55-65%,  mild AV sclerosis without stenosis,  mild MR and TR,  mild LAE,  trivial PR   TRANSURETHRAL RESECTION OF BLADDER TUMOR WITH GYRUS (TURBT-GYRUS)  1989   and URETEROSCOPIC STONE EXTRACTION     Family History: Family History  Problem Relation Age of  Onset   Breast cancer Mother    Thyroid disease Mother    Heart attack Father    Heart disease Father    Arthritis Father    Hypertension Father    Arthritis Sister    Stroke Maternal Grandmother    Lung cancer Maternal Grandfather    Other Paternal Grandmother        brain tumor   Aneurysm Paternal Grandfather    Colon cancer Neg Hx    Esophageal cancer Neg Hx    Rectal cancer Neg Hx    Stomach cancer Neg Hx    Liver cancer Neg Hx    Pancreatic cancer Neg Hx     Social History: Social History   Socioeconomic History   Marital status: Married    Spouse name: Not on file   Number of children: Not on file   Years of education: Not on file   Highest education level: Not on file  Occupational History   Not on file  Tobacco Use   Smoking status: Never   Smokeless tobacco: Former    Types: Chew    Quit date: 1991  Vaping Use   Vaping Use: Never used  Substance and Sexual Activity   Alcohol use: Yes    Alcohol/week: 2.0 standard drinks of alcohol    Types: 2 Standard drinks or equivalent per week   Drug use: Not Currently   Sexual activity: Not on file  Other Topics Concern   Not on file  Social History Narrative   Not on file   Social Determinants of Health   Financial Resource Strain: Not on file  Food Insecurity: Not on file  Transportation Needs: Not on file  Physical Activity: Not on file  Stress: Not on file  Social Connections: Not on file    Allergies: No Known Allergies  Outpatient Meds: Current Outpatient Medications  Medication Sig Dispense Refill   allopurinol (ZYLOPRIM) 100 MG tablet Take 100 mg by mouth daily.     aspirin 81 MG chewable tablet Chew 1 tablet (81 mg total) by mouth daily.     atorvastatin (LIPITOR) 40 MG tablet TAKE 1 TABLET BY MOUTH EVERY DAY 90 tablet 3   clopidogrel (PLAVIX) 75 MG tablet TAKE 1 TABLET BY MOUTH EVERY DAY 90 tablet 1   Cyanocobalamin (VITAMIN B12) 1000 MCG TBCR Take 1 tablet by mouth daily.     ezetimibe  (ZETIA) 10 MG tablet TAKE 1 TABLET BY MOUTH EVERY DAY 90 tablet 3   isosorbide mononitrate (IMDUR) 30 MG 24 hr tablet TAKE 1/2 TABLET (15 MG TOTAL) BY MOUTH DAILY. 45 tablet 2   metoprolol tartrate (LOPRESSOR) 25 MG tablet TAKE 1/2 TABLET BY MOUTH 2 TIMES DAILY. 90 tablet 1   Multiple Vitamin (MULTIVITAMIN) tablet Take 1 tablet by mouth daily.     PEG-KCl-NaCl-NaSulf-Na Asc-C (PLENVU) 140 g SOLR Take 140 g by mouth as directed. 1 each 0   testosterone cypionate (DEPOTESTOTERONE CYPIONATE) 200 MG/ML injection Inject 200 mg into the muscle every 28 (twenty-eight) days.  nitroGLYCERIN (NITROSTAT) 0.4 MG SL tablet Place 1 tablet (0.4 mg total) under the tongue every 5 (five) minutes as needed for chest pain. 25 tablet 3   No current facility-administered medications for this visit.      ___________________________________________________________________ Objective   Exam:  BP 106/80 (BP Location: Right Arm, Patient Position: Sitting, Cuff Size: Normal)   Pulse (!) 53   Ht '5\' 9"'$  (1.753 m)   Wt 240 lb 6.4 oz (109 kg)   SpO2 96%   BMI 35.50 kg/m  Wt Readings from Last 3 Encounters:  05/05/22 240 lb 6.4 oz (109 kg)  05/27/21 242 lb (109.8 kg)  06/19/20 238 lb (108 kg)    General: Well-appearing Eyes: sclera anicteric, no redness ENT: oral mucosa moist without lesions, no cervical or supraclavicular lymphadenopathy CV: Regular without murmur, no JVD, no peripheral edema Resp: clear to auscultation bilaterally, normal RR and effort noted GI: soft, no tenderness, with active bowel sounds. No guarding or palpable organomegaly noted.  Prominent rectus diastasis Skin; warm and dry, no rash or jaundice noted Neuro: awake, alert and oriented x 3. Normal gross motor function and fluent speech  Labs:  LVEF noted to be 50 to 55% during his catheterization in 2019  Assessment: Encounter Diagnoses  Name Primary?   Hx of colonic polyps Yes   Long term (current) use of  antithrombotics/antiplatelets    Coronary artery disease with LAD stent placement in 2019, no current angina or dyspnea.  [name] is overdue for surveillance colonoscopy.  He was agreeable after discussion of procedure and risks.  The benefits and risks of the planned procedure were described in detail with the patient or (when appropriate) their health care proxy.  Risks were outlined as including, but not limited to, bleeding, infection, perforation, adverse medication reaction leading to cardiac or pulmonary decompensation, pancreatitis (if ERCP).  The limitation of incomplete mucosal visualization was also discussed.  No guarantees or warranties were given.  Remain on aspirin through time of procedure, but hold Plavix 5 days prior.  He is aware of the small real risk of cardiovascular event during the Plavix hold.  It sounds like he has been able to do so without difficulty for some orthopedic procedures, and we will check with his cardiologist to be sure they are still agreeable.   Thank you for the courtesy of this consult.  Please call me with any questions or concerns.  [name]  CC: Referring provider noted above

## 2022-05-05 NOTE — Telephone Encounter (Signed)
   Name: Ryan Scott  DOB: 11/15/1950  MRN: [id number]  Primary Cardiologist: Shelva Majestic, MD  Last Ok Edwards 05/2021.  Preoperative team, please contact this patient and set up a phone call appointment for further preoperative risk assessment. Please obtain consent and complete medication review. Thank you for your help.  I confirm that guidance regarding antiplatelet and oral anticoagulation therapy has been completed. Plavix hold addressed in 05/2020 MD note prior to unrelated procedure so as long as no clinical change since that time, can reference.   Charlie Pitter, PA-C 05/05/2022, 12:04 PM [place]

## 2022-05-05 NOTE — Telephone Encounter (Signed)
[facility] Pre-operative Risk Assessment     Request for surgical clearance:     Endoscopy Procedure  What type of surgery is being performed?     Colonoscopy  When is this surgery scheduled?     05-24-2022  What type of clearance is required ?   Pharmacy  Are there any medications that need to be held prior to surgery and how long? Palvix, 5 day hold request  Practice name and name of physician performing surgery?      [facility]  What is your office phone and fax number?      Phone- [phone]  [phone]  Anesthesia type (None, local, MAC, general) ?       MAC

## 2022-05-06 ENCOUNTER — Telehealth: Payer: Self-pay | Admitting: *Deleted

## 2022-05-06 ENCOUNTER — Other Ambulatory Visit: Payer: Self-pay

## 2022-05-06 ENCOUNTER — Other Ambulatory Visit: Payer: Self-pay | Admitting: Gastroenterology

## 2022-05-06 MED ORDER — NA SULFATE-K SULFATE-MG SULF 17.5-3.13-1.6 GM/177ML PO SOLN: 1.0000 | Freq: Once | ORAL | 0 refills | Status: AC

## 2022-05-06 NOTE — Telephone Encounter (Signed)
See previous note related to this medication problem.  Please change to generic Suprep instead of Plenvu.  - HD

## 2022-05-06 NOTE — Telephone Encounter (Signed)
Pt is returning call.  

## 2022-05-06 NOTE — Telephone Encounter (Signed)
Left message to call back for tele pre op appt 

## 2022-05-06 NOTE — Telephone Encounter (Signed)
Agreeable to tele pre op appt 05/12/22 @ 9:20. Med rec and consent are done.

## 2022-05-06 NOTE — Telephone Encounter (Signed)
Agreeable to tele pre op appt 05/12/22 @ 9:20. Med rec and consent are done.     Patient Consent for Virtual Visit        Ryan Scott has provided verbal consent on 05/06/2022 for a virtual visit (video or telephone).   CONSENT FOR VIRTUAL VISIT FOR:  Ryan Scott  By participating in this virtual visit I agree to the following:  I hereby voluntarily request, consent and authorize [facility] and its employed or contracted physicians, physician assistants, nurse practitioners or other licensed health care professionals (the Practitioner), to provide me with telemedicine health care services (the "Services") as deemed necessary by the treating Practitioner. I acknowledge and consent to receive the Services by the Practitioner via telemedicine. I understand that the telemedicine visit will involve communicating with the Practitioner through live audiovisual communication technology and the disclosure of certain medical information by electronic transmission. I acknowledge that I have been given the opportunity to request an in-person assessment or other available alternative prior to the telemedicine visit and am voluntarily participating in the telemedicine visit.  I understand that I have the right to withhold or withdraw my consent to the use of telemedicine in the course of my care at any time, without affecting my right to future care or treatment, and that the Practitioner or I may terminate the telemedicine visit at any time. I understand that I have the right to inspect all information obtained and/or recorded in the course of the telemedicine visit and may receive copies of available information for a reasonable fee.  I understand that some of the potential risks of receiving the Services via telemedicine include:  Delay or interruption in medical evaluation due to technological equipment failure or disruption; Information transmitted may not be sufficient (e.g. poor resolution of  images) to allow for appropriate medical decision making by the Practitioner; and/or  In rare instances, security protocols could fail, causing a breach of personal health information.  Furthermore, I acknowledge that it is my responsibility to provide information about my medical history, conditions and care that is complete and accurate to the best of my ability. I acknowledge that Practitioner's advice, recommendations, and/or decision may be based on factors not within their control, such as incomplete or inaccurate data provided by me or distortions of diagnostic images or specimens that may result from electronic transmissions. I understand that the practice of medicine is not an exact science and that Practitioner makes no warranties or guarantees regarding treatment outcomes. I acknowledge that a copy of this consent can be made available to me via my patient portal ([facility]), or I can request a printed copy by calling the office of [facility].    I understand that my insurance will be billed for this visit.   I have read or had this consent read to me. I understand the contents of this consent, which adequately explains the benefits and risks of the Services being provided via telemedicine.  I have been provided ample opportunity to ask questions regarding this consent and the Services and have had my questions answered to my satisfaction. I give my informed consent for the services to be provided through the use of telemedicine in my medical care

## 2022-05-09 ENCOUNTER — Other Ambulatory Visit: Payer: Self-pay | Admitting: Cardiovascular Disease

## 2022-05-12 ENCOUNTER — Ambulatory Visit (INDEPENDENT_AMBULATORY_CARE_PROVIDER_SITE_OTHER): Payer: PPO | Admitting: Nurse Practitioner

## 2022-05-12 DIAGNOSIS — Z01810 Encounter for preprocedural cardiovascular examination: Secondary | ICD-10-CM

## 2022-05-12 NOTE — Progress Notes (Signed)
Virtual Visit via Telephone Note   Because of Ryan Scott's co-morbid illnesses, he is at least at moderate risk for complications without adequate follow up.  This format is felt to be most appropriate for this patient at this time.  The patient did not have access to video technology/had technical difficulties with video requiring transitioning to audio format only (telephone).  All issues noted in this document were discussed and addressed.  No physical exam could be performed with this format.  Please refer to the patient's chart for his consent to telehealth for [facility].  Evaluation Performed:  Preoperative cardiovascular risk assessment _____________   Date:  05/12/2022   Patient ID:  Ryan Scott, DOB August 28, 1951, MRN [id number] Patient Location:  Home Provider location:   Office  Primary Care Provider:  [name], MD Primary Cardiologist:  Shelva Majestic, MD  Chief Complaint / Patient Profile   71 y.o. y/o male with a h/o CAD s/p DES-pLAD, PTCA-dLAD in 2019, RBBB, hypertension, hyperlipidemia, bladder cancer and obesity who is pending colonoscopy on 05/24/2022 with [facility] and presents today for telephonic preoperative cardiovascular risk assessment.  Past Medical History    Past Medical History:  Diagnosis Date   Arthritis    "back, neck, shoulders" (12/19/2017)   Bladder cancer (HCC)    Borderline hypertension    BPH (benign prostatic hyperplasia)    Chronic lower back pain    ED (erectile dysfunction)    High cholesterol    "started BP RX 12/18/2017"   History of adenomatous polyp of colon    tubular adenoma   History of kidney stones    Hypertension    Hypogonadism male    Left ureteral stone    Lower urinary tract symptoms (LUTS)    RBBB (right bundle branch block)    Past Surgical History:  Procedure Laterality Date   CARDIOVASCULAR STRESS TEST  10/16/2012   normal myocardial perfusion study,  no ischemia/  not gated    COLONOSCOPY  last one 07-26-2013   CORONARY STENT INTERVENTION N/A 12/19/2017   Procedure: CORONARY STENT INTERVENTION;  Surgeon: [name], MD;  Location: [facility];  Service: Cardiovascular;  Laterality: N/A;   CYSTOSCOPY/RETROGRADE/URETEROSCOPY/STONE EXTRACTION WITH BASKET  1989   KNEE SURGERY Left 08/2021   LEFT HEART CATH AND CORONARY ANGIOGRAPHY N/A 12/19/2017   Procedure: LEFT HEART CATH AND CORONARY ANGIOGRAPHY;  Surgeon: [name], MD;  Location: [facility];  Service: Cardiovascular;  Laterality: N/A;  LAD    PROSTATE SURGERY  2002   PROSTATE SURGERY  2002   REFRACTIVE SURGERY Bilateral    SHOULDER ARTHROSCOPY WITH SUBACROMIAL DECOMPRESSION, ROTATOR CUFF REPAIR AND BICEP TENDON REPAIR Left 2013   SHOULDER SURGERY  09/2020   rotator cuff surgery   TONSILLECTOMY  ~ 1956   TRANSTHORACIC ECHOCARDIOGRAM  10/16/2012   normal LVF, ef 55-65%,  mild AV sclerosis without stenosis,  mild MR and TR,  mild LAE,  trivial PR   TRANSURETHRAL RESECTION OF BLADDER TUMOR WITH GYRUS (TURBT-GYRUS)  1989   and URETEROSCOPIC STONE EXTRACTION    Allergies  No Known Allergies  History of Present Illness    Ryan Scott is a 71 y.o. male who presents via audio/video conferencing for a telehealth visit today.  Pt was last seen in cardiology clinic on 05/27/2021 by [name].  At that time Ryan Scott was doing well. The patient is now pending procedure as outlined above. Since his last  visit, he has done well from a cardiac standpoint. He denies chest pain, palpitations, dyspnea, pnd, orthopnea, n, v, dizziness, syncope, edema, weight gain, or early satiety. All other systems reviewed and are otherwise negative except as noted above.   Home Medications    Prior to Admission medications   Medication Sig Start Date End Date Taking? Authorizing Provider  allopurinol (ZYLOPRIM) 100 MG tablet Take 100 mg by mouth daily.    [provider]  aspirin 81 MG  chewable tablet Chew 1 tablet (81 mg total) by mouth daily. 12/21/17   Almyra Deforest, PA  atorvastatin (LIPITOR) 40 MG tablet TAKE 1 TABLET BY MOUTH EVERY DAY 08/09/21   [name], MD  clopidogrel (PLAVIX) 75 MG tablet TAKE 1 TABLET BY MOUTH EVERY DAY 03/18/22   [name], MD  Cyanocobalamin (VITAMIN B12) 1000 MCG TBCR Take 1 tablet by mouth daily.    [provider]  ezetimibe (ZETIA) 10 MG tablet TAKE 1 TABLET BY MOUTH EVERY DAY 08/30/21   [name], MD  isosorbide mononitrate (IMDUR) 30 MG 24 hr tablet TAKE 1/2 TABLET (15 MG TOTAL) BY MOUTH DAILY. 01/24/22   [name], MD  metoprolol tartrate (LOPRESSOR) 25 MG tablet TAKE 1/2 TABLET BY MOUTH 2 TIMES DAILY 05/09/22   [name], MD  Multiple Vitamin (MULTIVITAMIN) tablet Take 1 tablet by mouth daily.    [provider]  nitroGLYCERIN (NITROSTAT) 0.4 MG SL tablet Place 1 tablet (0.4 mg total) under the tongue every 5 (five) minutes as needed for chest pain. 08/16/18 05/24/19  [name], MD  PEG-KCl-NaCl-NaSulf-Na Asc-C (PLENVU) 140 g SOLR Take 140 g by mouth as directed. 05/05/22   Doran Stabler, MD  testosterone cypionate (DEPOTESTOTERONE CYPIONATE) 200 MG/ML injection Inject 200 mg into the muscle every 28 (twenty-eight) days.  12/04/12   [provider]    Physical Exam    Vital Signs:  Ryan Scott does not have vital signs available for review today.  Given telephonic nature of communication, physical exam is limited. AAOx3. NAD. Normal affect.  Speech and respirations are unlabored.  Accessory Clinical Findings    None  Assessment & Plan    1.  Preoperative Cardiovascular Risk Assessment:  According to the Revised Cardiac Risk Index (RCRI), his Perioperative Risk of Major Cardiac Event is (%): 0.9. His Functional Capacity in METs is: 8.97 according to the Duke Activity Status Index (DASI). Therefore, based on ACC/AHA guidelines, patient would be at acceptable risk for the  planned procedure without further cardiovascular testing.   Per office protocol, patient may hold Plavix for 5 days prior to procedure.  Please resume Plavix as soon as possible postprocedure, at the discretion of the surgeon.  A copy of this note will be routed to requesting surgeon.  Time:   Today, I have spent 4 minutes with the patient with telehealth technology discussing medical history, symptoms, and management plan.     Lenna Sciara, NP  05/12/2022, 9:25 AM

## 2022-05-12 NOTE — Telephone Encounter (Signed)
Left message to return call 

## 2022-05-13 NOTE — Telephone Encounter (Signed)
No answer, no return call.

## 2022-05-18 NOTE — Telephone Encounter (Signed)
Per pre op appointment on 05-12-22 with Diona Browner, NP Cardiology: ."...based on ACC/AHA guidelines, patient would be at acceptable risk for the planned procedure without further cardiovascular testing.    Per office protocol, patient may hold Plavix for 5 days prior to procedure.  Please resume Plavix as soon as possible postprocedure, at the discretion of the surgeon."  Patient's procedure is scheduled for this Monday,8-28 with Dr. Loletha Carrow and needs to begin holding Plavix TODAY.  Called and Left detailed message for patient and asked that he call back to confirm understanding.  Also, called spouse, Gilmore Laroche and left detailed message asking to have patient call us back to confirm understanding to start holding Plavix today for Monday's procedure.

## 2022-05-18 NOTE — Telephone Encounter (Signed)
noted 

## 2022-05-18 NOTE — Telephone Encounter (Signed)
Correction, procedure is Tuesday the 29th. Please clarify with patient when he calls back

## 2022-05-18 NOTE — Telephone Encounter (Signed)
Patient called and states he has stopped taking Plavix

## 2022-05-24 ENCOUNTER — Encounter: Payer: Self-pay | Admitting: Gastroenterology

## 2022-05-24 ENCOUNTER — Ambulatory Visit (AMBULATORY_SURGERY_CENTER): Payer: PPO | Admitting: Gastroenterology

## 2022-05-24 VITALS — BP 102/61 | HR 49 | Temp 96.8°F | Resp 11 | Ht 69.0 in | Wt 240.0 lb

## 2022-05-24 DIAGNOSIS — D128 Benign neoplasm of rectum: Secondary | ICD-10-CM

## 2022-05-24 DIAGNOSIS — D120 Benign neoplasm of cecum: Secondary | ICD-10-CM | POA: Diagnosis not present

## 2022-05-24 DIAGNOSIS — Z86010 Personal history of colonic polyps: Secondary | ICD-10-CM | POA: Diagnosis not present

## 2022-05-24 DIAGNOSIS — Z09 Encounter for follow-up examination after completed treatment for conditions other than malignant neoplasm: Secondary | ICD-10-CM

## 2022-05-24 DIAGNOSIS — D123 Benign neoplasm of transverse colon: Secondary | ICD-10-CM

## 2022-05-24 DIAGNOSIS — K635 Polyp of colon: Secondary | ICD-10-CM | POA: Diagnosis not present

## 2022-05-24 MED ORDER — SODIUM CHLORIDE 0.9 % IV SOLN: 500.0000 mL | Freq: Once | INTRAVENOUS | Status: DC

## 2022-05-24 NOTE — Progress Notes (Signed)
No changes to clinical history since GI office visit on 05/05/22.  The patient is appropriate for an endoscopic procedure in the ambulatory setting.  - Wilfrid Lund, MD

## 2022-05-24 NOTE — Patient Instructions (Signed)
Handouts provided about hemorrhoids, diverticulosis and polyps.  Resume previous diet. Resume Plavix at prior dose in 2 days.  Await pathology results. For next exam- split dose golytely and abdominal binder.   YOU HAD AN ENDOSCOPIC PROCEDURE TODAY AT [facility]:   Refer to the procedure report that was given to you for any specific questions about what was found during the examination.  If the procedure report does not answer your questions, please call your gastroenterologist to clarify.  If you requested that your care partner not be given the details of your procedure findings, then the procedure report has been included in a sealed envelope for you to review at your convenience later.  YOU SHOULD EXPECT: Some feelings of bloating in the abdomen. Passage of more gas than usual.  Walking can help get rid of the air that was put into your GI tract during the procedure and reduce the bloating. If you had a lower endoscopy (such as a colonoscopy or flexible sigmoidoscopy) you may notice spotting of blood in your stool or on the toilet paper. If you underwent a bowel prep for your procedure, you may not have a normal bowel movement for a few days.  Please Note:  You might notice some irritation and congestion in your nose or some drainage.  This is from the oxygen used during your procedure.  There is no need for concern and it should clear up in a day or so.  SYMPTOMS TO REPORT IMMEDIATELY:  Following lower endoscopy (colonoscopy or flexible sigmoidoscopy):  Excessive amounts of blood in the stool  Significant tenderness or worsening of abdominal pains  Swelling of the abdomen that is new, acute  Fever of 100F or higher   For urgent or emergent issues, a gastroenterologist can be reached at any hour by calling [phone]. Do not use MyChart messaging for urgent concerns.    DIET:  We do recommend a small meal at first, but then you may proceed to your regular diet.   Drink plenty of fluids but you should avoid alcoholic beverages for 24 hours.  ACTIVITY:  You should plan to take it easy for the rest of today and you should NOT DRIVE or use heavy machinery until tomorrow (because of the sedation medicines used during the test).    FOLLOW UP: Our staff will call the number listed on your records the next business day following your procedure.  We will call around 7:15- 8:00 am to check on you and address any questions or concerns that you may have regarding the information given to you following your procedure. If we do not reach you, we will leave a message.  If you develop any symptoms (ie: fever, flu-like symptoms, shortness of breath, cough etc.) before then, please call [phone].  If you test positive for Covid 19 in the 2 weeks post procedure, please call and report this information to [place].    If any biopsies were taken you will be contacted by phone or by letter within the next 1-3 weeks.  Please call us at [phone] if you have not heard about the biopsies in 3 weeks.    SIGNATURES/CONFIDENTIALITY: You and/or your care partner have signed paperwork which will be entered into your electronic medical record.  These signatures attest to the fact that that the information above on your After Visit Summary has been reviewed and is understood.  Full responsibility of the confidentiality of this discharge information lies with you and/or your  care-partner.  

## 2022-05-24 NOTE — Op Note (Signed)
[place] Patient Name: Ryan Scott Procedure Date: 05/24/2022 11:27 AM MRN: [id number] Endoscopist: Mallie Mussel L. Loletha Carrow , MD Age: 71 Referring MD:  Date of Birth: 1950-11-29 Gender: Male Account #: [id number] Procedure:                Colonoscopy Indications:              Surveillance: Personal history of adenomatous                            polyps on last colonoscopy > 5 years ago                           TA <21m and another <154mpolyp (not retrieved)                            last colonoscopy Oct 2014 Medicines:                Monitored Anesthesia Care Procedure:                Pre-Anesthesia Assessment:                           - Prior to the procedure, a History and Physical                            was performed, and patient medications and                            allergies were reviewed. The patient's tolerance of                            previous anesthesia was also reviewed. The risks                            and benefits of the procedure and the sedation                            options and risks were discussed with the patient.                            All questions were answered, and informed consent                            was obtained. Prior Anticoagulants: The patient has                            taken Plavix (clopidogrel), last dose was 5 days                            prior to procedure. ASA Grade Assessment: II - A                            patient with mild systemic disease. After reviewing  the risks and benefits, the patient was deemed in                            satisfactory condition to undergo the procedure.                           After obtaining informed consent, the colonoscope                            was passed under direct vision. Throughout the                            procedure, the patient's blood pressure, pulse, and                            oxygen saturations were monitored  continuously. The                            Olympus CF-HQ190L [id number]) Colonoscope was                            introduced through the anus and advanced to the the                            cecum, identified by appendiceal orifice and                            ileocecal valve. The colonoscopy was performed with                            difficulty due to a redundant colon and significant                            looping. Successful completion of the procedure was                            aided by using manual pressure, straightening and                            shortening the scope to obtain bowel loop reduction                            and lavage. The patient tolerated the procedure                            well. The quality of the bowel preparation was good                            after lavage (esp.. right colon). The ileocecal                            valve, appendiceal orifice, and rectum were  photographed. The bowel preparation used was SUPREP. Scope In: 11:34:01 AM Scope Out: 12:11:37 PM Scope Withdrawal Time: 0 hours 27 minutes 46 seconds  Total Procedure Duration: 0 hours 37 minutes 36 seconds  Findings:                 The perianal and digital rectal examinations were                            normal.                           The colon (entire examined portion) was                            significantly redundant.                           The descending colon was tortuous.                           Repeat examination of right colon under NBI                            performed.                           Multiple small-mouthed diverticula were found in                            the left colon.                           Three sessile polyps were found in the transverse                            colon. The polyps were 4 to 6 mm in size. These                            polyps were removed with a cold snare. Resection                             and retrieval were complete. (Jar 1)                           A 10 mm polyp was found in the cecum. The polyp was                            sessile. The polyp was removed with a hot snare.                            Resection and retrieval were complete. (Jar 2)                           A 5 mm polyp was found in the rectum. The polyp was  sessile. The polyp was removed with a cold snare.                            Resection and retrieval were complete. (Jar 2)                           A 5 mm polyp was found in the cecum. The polyp was                            flat. The polyp was removed with a cold snare.                            Resection and retrieval were complete. (Jar 3)                           Two sessile polyps were found in the transverse                            colon. The polyps were 6 to 8 mm in size. These                            polyps were removed with a cold snare. Resection                            and retrieval were complete.                           Internal hemorrhoids were found.                           The exam was otherwise without abnormality on                            direct and retroflexion views. Complications:            No immediate complications. Estimated Blood Loss:     Estimated blood loss was minimal. Impression:               - Redundant colon.                           - Tortuous colon.                           - Diverticulosis in the left colon.                           - Three 4 to 6 mm polyps in the transverse colon,                            removed with a cold snare. Resected and retrieved.                           - One 10 mm polyp in the cecum, removed with a hot  snare. Resected and retrieved.                           - One 5 mm polyp in the rectum, removed with a cold                            snare. Resected and retrieved.                            - One 5 mm polyp in the cecum, removed with a cold                            snare. Resected and retrieved.                           - Two 6 to 8 mm polyps in the transverse colon,                            removed with a cold snare. Resected and retrieved.                           - Internal hemorrhoids.                           - The examination was otherwise normal on direct                            and retroflexion views. Recommendation:           - Patient has a contact number available for                            emergencies. The signs and symptoms of potential                            delayed complications were discussed with the                            patient. Return to normal activities tomorrow.                            Written discharge instructions were provided to the                            patient.                           - Resume previous diet.                           - Resume Plavix (clopidogrel) at prior dose in 2                            days.                           -  Await pathology results.                           - Repeat colonoscopy is recommended for                            surveillance. The colonoscopy date will be                            determined after pathology results from today's                            exam become available for review. (for next exam -                            split-dose golytely prep and abdominal binder) Angelette Ganus L. Loletha Carrow, MD 05/24/2022 47:34:03 PM This report has been signed electronically.

## 2022-05-24 NOTE — Progress Notes (Signed)
To pacu, VSS. Report to [email or web address] 

## 2022-05-24 NOTE — Progress Notes (Signed)
Called to room to assist during endoscopic procedure.  Patient ID and intended procedure confirmed with present staff. Received instructions for my participation in the procedure from the performing physician.  

## 2022-05-24 NOTE — Progress Notes (Signed)
Pt's states no medical or surgical changes since previsit or office visit. 

## 2022-05-25 ENCOUNTER — Telehealth: Payer: Self-pay | Admitting: *Deleted

## 2022-05-25 NOTE — Telephone Encounter (Signed)
Attempted f/u phone call. No answer. Left message. °

## 2022-05-26 ENCOUNTER — Encounter: Payer: Self-pay | Admitting: Gastroenterology

## 2022-05-31 DIAGNOSIS — E291 Testicular hypofunction: Secondary | ICD-10-CM | POA: Diagnosis not present

## 2022-06-02 ENCOUNTER — Telehealth: Payer: Self-pay | Admitting: Cardiovascular Disease

## 2022-06-02 DIAGNOSIS — E785 Hyperlipidemia, unspecified: Secondary | ICD-10-CM

## 2022-06-02 DIAGNOSIS — I2510 Atherosclerotic heart disease of native coronary artery without angina pectoris: Secondary | ICD-10-CM

## 2022-06-02 DIAGNOSIS — Z79899 Other long term (current) drug therapy: Secondary | ICD-10-CM

## 2022-06-02 NOTE — Telephone Encounter (Signed)
Patient wants to know what blood work is needed before his 10/3 appointment with [name].

## 2022-06-02 NOTE — Telephone Encounter (Signed)
Patient would like to know if he needs lab work prior to his appointment 10/3.

## 2022-06-03 ENCOUNTER — Other Ambulatory Visit: Payer: Self-pay

## 2022-06-03 DIAGNOSIS — E785 Hyperlipidemia, unspecified: Secondary | ICD-10-CM

## 2022-06-03 DIAGNOSIS — I2510 Atherosclerotic heart disease of native coronary artery without angina pectoris: Secondary | ICD-10-CM

## 2022-06-03 NOTE — Telephone Encounter (Signed)
Ordered labs. Based on previous lab work.  Thank you!

## 2022-06-03 NOTE — Telephone Encounter (Signed)
Please order CMET and fasting lipid profile

## 2022-06-03 NOTE — Telephone Encounter (Signed)
Patient informed of CMET and fasting lipids. Orders placed.

## 2022-06-21 DIAGNOSIS — I2510 Atherosclerotic heart disease of native coronary artery without angina pectoris: Secondary | ICD-10-CM | POA: Diagnosis not present

## 2022-06-21 DIAGNOSIS — E785 Hyperlipidemia, unspecified: Secondary | ICD-10-CM | POA: Diagnosis not present

## 2022-06-21 DIAGNOSIS — E291 Testicular hypofunction: Secondary | ICD-10-CM | POA: Diagnosis not present

## 2022-06-21 DIAGNOSIS — R948 Abnormal results of function studies of other organs and systems: Secondary | ICD-10-CM | POA: Diagnosis not present

## 2022-06-21 LAB — COMPREHENSIVE METABOLIC PANEL
ALT: 25 IU/L (ref 0–44)
AST: 22 IU/L (ref 0–40)
Albumin/Globulin Ratio: 1.8 (ref 1.2–2.2)
Albumin: 4.3 g/dL (ref 3.8–4.8)
Alkaline Phosphatase: 59 IU/L (ref 44–121)
BUN/Creatinine Ratio: 13 (ref 10–24)
BUN: 16 mg/dL (ref 8–27)
Bilirubin Total: 1.6 mg/dL — ABNORMAL HIGH (ref 0.0–1.2)
CO2: 24 mmol/L (ref 20–29)
Calcium: 9.3 mg/dL (ref 8.6–10.2)
Chloride: 103 mmol/L (ref 96–106)
Creatinine, Ser: 1.28 mg/dL — ABNORMAL HIGH (ref 0.76–1.27)
Globulin, Total: 2.4 g/dL (ref 1.5–4.5)
Glucose: 92 mg/dL (ref 70–99)
Potassium: 4.7 mmol/L (ref 3.5–5.2)
Sodium: 139 mmol/L (ref 134–144)
Total Protein: 6.7 g/dL (ref 6.0–8.5)
eGFR: 60 mL/min/{1.73_m2} (ref >59)

## 2022-06-21 LAB — LIPID PANEL
Chol/HDL Ratio: 3.1 ratio (ref 0.0–5.0)
Cholesterol, Total: 120 mg/dL (ref 100–199)
HDL: 39 mg/dL — ABNORMAL LOW (ref >39)
LDL Chol Calc (NIH): 61 mg/dL (ref 0–99)
Triglycerides: 109 mg/dL (ref 0–149)
VLDL Cholesterol Cal: 20 mg/dL (ref 5–40)

## 2022-06-28 ENCOUNTER — Ambulatory Visit: Payer: PPO | Attending: Cardiovascular Disease | Admitting: Cardiovascular Disease

## 2022-06-28 ENCOUNTER — Encounter: Payer: Self-pay | Admitting: Cardiovascular Disease

## 2022-06-28 VITALS — BP 132/72 | HR 57 | Ht 69.5 in | Wt 240.8 lb

## 2022-06-28 DIAGNOSIS — Z8551 Personal history of malignant neoplasm of bladder: Secondary | ICD-10-CM

## 2022-06-28 DIAGNOSIS — I2510 Atherosclerotic heart disease of native coronary artery without angina pectoris: Secondary | ICD-10-CM

## 2022-06-28 DIAGNOSIS — E668 Other obesity: Secondary | ICD-10-CM

## 2022-06-28 DIAGNOSIS — E785 Hyperlipidemia, unspecified: Secondary | ICD-10-CM | POA: Diagnosis not present

## 2022-06-28 DIAGNOSIS — I10 Essential (primary) hypertension: Secondary | ICD-10-CM | POA: Diagnosis not present

## 2022-06-28 DIAGNOSIS — Z9861 Coronary angioplasty status: Secondary | ICD-10-CM | POA: Diagnosis not present

## 2022-06-28 NOTE — Patient Instructions (Signed)

## 2022-06-28 NOTE — Progress Notes (Signed)
Cardiology Office Note    Date:  06/28/2022   ID:  Carmello, Cabiness 14-Apr-1951, MRN [id number]  PCP:  [name], MD  Cardiologist:  Shelva Majestic, MD   13 month F/U evaluation  History of Present Illness:  Ryan Scott is a 71 y.o. male who presents for a 13 month follow-up cardiology evaluation.  Mr. Betten had remotely seen Dr. Rollene Fare.  He was seen in March 2019 by Jory Sims, NP referred by Dr. Harlene Ramus after his CT of his chest healed significant coronary calcification.  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  He had noticed more shortness of breath but denied any significant chest pain symptomatology.  With his symptoms and CT scan, he was referred for cardiac catheterization.  On December 19, 2017 cardiac catheterization performed by me showed low normal LV function with an EF of 50 to 55%.  There was significant coronary calcification 20 to 30% proximal LAD stenosis and then 80% stenosis after the first diagonal with 80% distal stenosis.  Circumflex had 60% focal OM1 stenosis prior to bifurcation and the RCA was significantly calcified with 50% stenosis in the proximal bend of the vessel and diffuse 70, 80, and 95% stenosis beyond the acute margin extending up to the takeoff of the PDA.  The PDA and PLA were collateralized via the distal circumflex.  He underwent a difficult but successful PCI to the calcified LAD with ultimate insertion of a 3.0 x 16 mm Synergy DES stent to the proximal lesion PTCA of the distal LAD.  Subsequently, he was on aspirin and Brilinta and had been on isosorbide 30 mg in addition to metoprolol 12.5 mg twice a day.  He also has been on Zetia 10 mg in addition to Livalo reportedly at 1 mg daily.  He has experienced shortness of breath really when he lies down.  He denies any significant volume issues.  He has noticed some slight improvement with dyspnea following coffee.  I saw him for initial  evaluation following his hospitalization in July 2019.  Due to potential Brilinta induced dyspnea he was switched to clopidogrel 75 mg daily.  Subsequent P2 Y12 testing adjusted possible hyper responsiveness at 7 PR U.  Subsequently, his shortness of breath has improved.   I last saw him in November 2019.  He was unaware of any excess bleeding.  He continues to be active and is able to do anything he can without chest pain or shortness of breath.  He underwent went repeat laboratory which showed a total cholesterol of 111 triglycerides 74 HDL 41 and LDL cholesterol 55 while on combination Zetia and Livalo.  He was concerned about the cost of Livalo and since he was unaware of any issues with other statins I recommended changing to atorvastatin.   I saw him in August 2020.  Since his prior evaluation he had been stable and denied any recurrent chest pain or anginal symptomatology.  He had initially lost about 25 pounds following his coronary event but unfortunately due to the to the Covid pandemic began to develop increasing weight.    I last saw him in September 2021.  Since his prior evaluation he continued to do well and was without chest pain or shortness of breath.   He suffered a stress fracture in his left foot and has been in the boot for the past 4 to 5 weeks.  He admits to further weight gain.  Since I last saw him, he underwent successful right shoulder surgery by Dr. Mardelle Matte in January 2022.  He was given preoperative clearance and had held his Plavix for 5 days prior to the surgery and tolerated surgery well.  He also has remote history of bladder cancer and undergoes every 5-year CT imaging and cystoscopy.  Most recently he has been walking 2-1/2 miles per day 6 days/week.  Over this weekend he tripped over his cat and sprained his knee and for the last several days has not been walking.  He is unaware of any palpitations.  He has not been successful with recent weight loss.  He presents for  evaluation.  Past Medical History:  Diagnosis Date   Arthritis    "back, neck, shoulders" (12/19/2017)   Bladder cancer (HCC)    Borderline hypertension    BPH (benign prostatic hyperplasia)    Chronic lower back pain    ED (erectile dysfunction)    High cholesterol    "started BP RX 12/18/2017"   History of adenomatous polyp of colon    tubular adenoma   History of kidney stones    Hypertension    Hypogonadism male    Left ureteral stone    Lower urinary tract symptoms (LUTS)    RBBB (right bundle branch block)     Past Surgical History:  Procedure Laterality Date   CARDIOVASCULAR STRESS TEST  10/16/2012   normal myocardial perfusion study,  no ischemia/  not gated   COLONOSCOPY  last one 07-26-2013   CORONARY STENT INTERVENTION N/A 12/19/2017   Procedure: CORONARY STENT INTERVENTION;  Surgeon: [name], MD;  Location: [facility];  Service: Cardiovascular;  Laterality: N/A;   CYSTOSCOPY/RETROGRADE/URETEROSCOPY/STONE EXTRACTION WITH BASKET  1989   KNEE SURGERY Left 08/2021   LEFT HEART CATH AND CORONARY ANGIOGRAPHY N/A 12/19/2017   Procedure: LEFT HEART CATH AND CORONARY ANGIOGRAPHY;  Surgeon: [name], MD;  Location: [facility];  Service: Cardiovascular;  Laterality: N/A;  LAD    PROSTATE SURGERY  2002   PROSTATE SURGERY  2002   REFRACTIVE SURGERY Bilateral    SHOULDER ARTHROSCOPY WITH SUBACROMIAL DECOMPRESSION, ROTATOR CUFF REPAIR AND BICEP TENDON REPAIR Left 2013   SHOULDER SURGERY  09/2020   rotator cuff surgery   TONSILLECTOMY  ~ 1956   TRANSTHORACIC ECHOCARDIOGRAM  10/16/2012   normal LVF, ef 55-65%,  mild AV sclerosis without stenosis,  mild MR and TR,  mild LAE,  trivial PR   TRANSURETHRAL RESECTION OF BLADDER TUMOR WITH GYRUS (TURBT-GYRUS)  1989   and URETEROSCOPIC STONE EXTRACTION    Current Medications: Outpatient Medications Prior to Visit  Medication Sig Dispense Refill   allopurinol (ZYLOPRIM) 100 MG tablet Take 100 mg by  mouth daily.     aspirin 81 MG chewable tablet Chew 1 tablet (81 mg total) by mouth daily.     atorvastatin (LIPITOR) 40 MG tablet TAKE 1 TABLET BY MOUTH EVERY DAY 90 tablet 3   clopidogrel (PLAVIX) 75 MG tablet TAKE 1 TABLET BY MOUTH EVERY DAY 90 tablet 1   Cyanocobalamin (VITAMIN B12) 1000 MCG TBCR Take 1 tablet by mouth daily.     ezetimibe (ZETIA) 10 MG tablet TAKE 1 TABLET BY MOUTH EVERY DAY 90 tablet 3   isosorbide mononitrate (IMDUR) 30 MG 24 hr tablet TAKE 1/2 TABLET (15 MG TOTAL) BY MOUTH DAILY. 45 tablet 2   metoprolol tartrate (LOPRESSOR) 25 MG tablet TAKE 1/2 TABLET BY MOUTH 2 TIMES DAILY 90 tablet  1   Multiple Vitamin (MULTIVITAMIN) tablet Take 1 tablet by mouth daily.     nitroGLYCERIN (NITROSTAT) 0.4 MG SL tablet Place 1 tablet (0.4 mg total) under the tongue every 5 (five) minutes as needed for chest pain. 25 tablet 3   testosterone cypionate (DEPOTESTOTERONE CYPIONATE) 200 MG/ML injection Inject 200 mg into the muscle every 28 (twenty-eight) days.      No facility-administered medications prior to visit.     Allergies:   Patient has no known allergies.   Social History   Socioeconomic History   Marital status: Married    Spouse name: Not on file   Number of children: Not on file   Years of education: Not on file   Highest education level: Not on file  Occupational History   Not on file  Tobacco Use   Smoking status: Never   Smokeless tobacco: Former    Types: Chew    Quit date: 1991  Vaping Use   Vaping Use: Never used  Substance and Sexual Activity   Alcohol use: Yes    Alcohol/week: 2.0 standard drinks of alcohol    Types: 2 Standard drinks or equivalent per week   Drug use: Not Currently   Sexual activity: Not on file  Other Topics Concern   Not on file  Social History Narrative   Not on file   Social Determinants of Health   Financial Resource Strain: Not on file  Food Insecurity: Not on file  Transportation Needs: Not on file  Physical Activity:  Not on file  Stress: Not on file  Social Connections: Not on file     Family History:  The patient's family history includes Aneurysm in his paternal grandfather; Arthritis in his father and sister; Breast cancer in his mother; Heart attack in his father; Heart disease in his father; Hypertension in his father; Lung cancer in his maternal grandfather; Other in his paternal grandmother; Stroke in his maternal grandmother; Thyroid disease in his mother.   ROS General: Negative; No fevers, chills, or night sweats;  HEENT: Negative; No changes in vision or hearing, sinus congestion, difficulty swallowing Pulmonary: Mild shortness of breath.  Possible silicosis, not definitively diagnosed Cardiovascular: Negative; No chest pain, presyncope, syncope, palpitations GI: Negative; No nausea, vomiting, diarrhea, or abdominal pain GU: History of bladder cancer, undergoing every 5-year CT imaging and cystoscopy. Musculoskeletal: Left foot fracture he was in a boot for 4 months at the end of 2021.  Status post right rotator cuff surgery January 2022. Hematologic/Oncology: Negative; no easy bruising, bleeding Endocrine: Negative; no heat/cold intolerance; no diabetes Neuro: Negative; no changes in balance, headaches Skin: Negative; No rashes or skin lesions Psychiatric: Negative; No behavioral problems, depression Sleep: Negative; No snoring, daytime sleepiness, hypersomnolence, bruxism, restless legs, hypnogognic hallucinations, no cataplexy Other comprehensive 14 point system review is negative.   PHYSICAL EXAM:   VS:  BP 132/72   Pulse (!) 57   Ht 5' 9.5" (1.765 m)   Wt 240 lb 12.8 oz (109.2 kg)   SpO2 96%   BMI 35.05 kg/m     Repeat blood pressure by me 130/78.  He states his blood pressure at home typically runs around 120/80.  Wt Readings from Last 3 Encounters:  06/28/22 240 lb 12.8 oz (109.2 kg)  05/24/22 240 lb (108.9 kg)  05/05/22 240 lb 6.4 oz (109 kg)     Physical Exam BP  132/72   Pulse (!) 57   Ht 5' 9.5" (1.765 m)   Wt  240 lb 12.8 oz (109.2 kg)   SpO2 96%   BMI 35.05 kg/m  General: Alert, oriented, no distress.  Skin: normal turgor, no rashes, warm and dry HEENT: Normocephalic, atraumatic. Pupils equal round and reactive to light; sclera anicteric; extraocular muscles intact; Fundi ** Nose without nasal septal hypertrophy Mouth/Parynx benign; Mallinpatti scale Neck: No JVD, no carotid bruits; normal carotid upstroke Lungs: clear to ausculatation and percussion; no wheezing or rales Chest wall: without tenderness to palpitation Heart: PMI not displaced, RRR, s1 s2 normal, 1/6 systolic murmur, no diastolic murmur, no rubs, gallops, thrills, or heaves Abdomen: soft, nontender; no hepatosplenomehaly, BS+; abdominal aorta nontender and not dilated by palpation. Back: no CVA tenderness Pulses 2+ Musculoskeletal: full range of motion, normal strength, no joint deformities Extremities: no clubbing cyanosis or edema, Homan's sign negative  Neurologic: grossly nonfocal; Cranial nerves grossly wnl Psychologic: Normal mood and affect    General: Alert, oriented, no distress.  Skin: normal turgor, no rashes, warm and dry HEENT: Normocephalic, atraumatic. Pupils equal round and reactive to light; sclera anicteric; extraocular muscles intact  Nose without nasal septal hypertrophy Mouth/Parynx benign; Mallinpatti scale 3 Neck: No JVD, no carotid bruits; normal carotid upstroke Lungs: clear to ausculatation and percussion; no wheezing or rales Chest wall: without tenderness to palpitation Heart: PMI not displaced, RRR, s1 s2 normal, 1/6 systolic murmur, no diastolic murmur, no rubs, gallops, thrills, or heaves Abdomen: soft, nontender; no hepatosplenomehaly, BS+; abdominal aorta nontender and not dilated by palpation. Back: no CVA tenderness Pulses 2+ Musculoskeletal: full range of motion, normal strength, no joint deformities Extremities: Trivial right ankle  swelling; no clubbing cyanosis, Homan's sign negative  Neurologic: grossly nonfocal; Cranial nerves grossly wnl Psychologic: Normal mood and affect   Studies/Labs Reviewed:   ECG (independently read by me): Sinus bradycardia at 57, 1st degree AV block, PR 212 msec  May 27, 2021 ECG (independently read by me): Sinus bradycardia 56 bpm, first-degree block with PR 212 msec.  Borderline RBBB, QRS 120 msec  June 19, 2020 ECG (independently read by me): Sinus rhythm with frequent PACs, right bundle branch block borderline first-degree block with PR interval 202  ms.  QS complex in V3.   August 2020 ECG (independently read by me): Sinus bradycardia 56 bpm, incomplete right bundle branch block.  Borderline first-degree AV block with a PR interval 2 8 ms.  Small inferior Q waves  November 2019 ECG (independently read by me): Sinus bradycardia 52 bpm.  First-degree AV block.  Right bundle branch block.  July 2019 ECG (independently read by me): Sinus bradycardia at 50 bpm.  Mild first-degree AV block with a PR interval of 218 ms.  Incomplete right bundle branch block.  Recent Labs:    Latest Ref Rng & Units 06/21/2022    9:58 AM 05/18/2021    9:54 AM 06/08/2020   10:59 AM  BMP  Glucose 70 - 99 mg/dL 92  96  91   BUN 8 - 27 mg/dL 16  19  19    Creatinine 0.76 - 1.27 mg/dL 1.28  1.28  1.24   BUN/Creat Ratio 10 - 24 13  15  15    Sodium 134 - 144 mmol/L 139  137  138   Potassium 3.5 - 5.2 mmol/L 4.7  4.7  4.7   Chloride 96 - 106 mmol/L 103  104  104   CO2 20 - 29 mmol/L 24  20  21    Calcium 8.6 - 10.2 mg/dL 9.3  9.0  9.2  Latest Ref Rng & Units 06/21/2022    9:58 AM 05/18/2021    9:54 AM 06/08/2020   10:59 AM  Hepatic Function  Total Protein 6.0 - 8.5 g/dL 6.7  6.6  6.7   Albumin 3.8 - 4.8 g/dL 4.3  4.4  4.6   AST 0 - 40 IU/L 22  19  27    ALT 0 - 44 IU/L 25  29  32   Alk Phosphatase 44 - 121 IU/L 59  59  60   Total Bilirubin 0.0 - 1.2 mg/dL 1.6  1.3  2.5         Latest Ref Rng & Units 12/20/2017    2:32 AM 12/18/2017   11:29 AM 05/16/2013    9:18 AM  CBC  WBC 4.0 - 10.5 K/uL 8.8  6.9  6.9   Hemoglobin 13.0 - 17.0 g/dL 14.8  17.0  17.2   Hematocrit 39.0 - 52.0 % 44.7  49.8  49.9   Platelets 150 - 400 K/uL 190  236  231    Lab Results  Component Value Date   MCV 90.1 12/20/2017   MCV 89 12/18/2017   MCV 87.1 05/16/2013   No results found for: "TSH" No results found for: "HGBA1C"   BNP No results found for: "BNP"  ProBNP No results found for: "PROBNP"   Lipid Panel     Component Value Date/Time   CHOL 120 06/21/2022 0958   TRIG 109 06/21/2022 0958   HDL 39 (L) 06/21/2022 0958   CHOLHDL 3.1 06/21/2022 0958   LDLCALC 61 06/21/2022 0958     RADIOLOGY: No results found.   Additional studies/ records that were reviewed today include:   CT CHEST: IMPRESSION: 1. Two right middle lobe pulmonary nodules are unchanged since 2016 and benign. No additional imaging follow-up is necessary. 2. No acute pulmonary findings. Mild bilateral middle lobe and lower lobe scarring/atelectasis. 3. Extensive coronary artery calcified atherosclerosis and/or stents.  ------------------------------------------------------------------------------------------------------  December 19, 2017 Cardiac Catheterization/PCI:  Low normal global LV function with an EF of 50-55% without definitive focal segmental wall motion abnormalities.   Significant coronary calcification with vessel CAD.  The LAD had proximal 20-30% stenosis and a calcified segment and then following a sharp and had 80% stenosis after the first diagonal vessel and 80% distal stenosis;  60% focal OM1 stenosis prior to a bifurcation; calcified RCA with possible 50% stenosis on a proximal bend in the vessel.  There is diffuse 70-80-95% stenoses  beyond the acute margin extending up to the takeoff of the PDA.  There is collateralization of the PDA and PLA vessels via the distal circumflex.    Difficult but successful PCI to the calcified LAD with ultimate insertion of a 3.016 mm Synergy DES stent to the proximal lesion with the 80% stenosis being reduced to 0%, and PTCA of the distal LAD lesion with the 80% stenosis being reduced to less than 5% utilizing bivalirudin and aspirin/Brilinta.   RECOMMENDATION: DAPT for a mininum of one year. Following this initial procedure he will be started on anti-ischemic medical therapy including beta blocker and nitrates.  Will review angiograms with colleagues.  The distal RCA is collateralized.  Consider attempt at initial medical therapy of the diffusely diseased distal calcified RCA versus staged  PCI.    ASSESSMENT:    No diagnosis found.   PLAN:  Mr. Juanjose Mojica is a 71 year-old gentleman who has a history of mild hypertension, hyperlipidemia, and has documented stable pulmonary nodules and  extensive coronary calcification.  He was found to have multivessel CAD and underwent successful stenting of a calcified proximal LAD stenosis with PTCA of the distal LAD stenosis in March 2019.  At that time he had diffuse disease in his RCA which was significantly narrowed distally but there was good collateralization to the distal RCA from the circumflex territory.  Since his coronary intervention he has done well.  Presently, he has been without recurrent anginal symptomatology.  He had developed dyspnea related to Brilinta and continues to be on DAPT with aspirin/clopidogrel.  I performed a P2 Y 12 test which suggested possible hyperresponsiveness with the*17 allele.  Over the past year, he tolerated right shoulder surgery without cardiovascular compromise.  He has not been successful with weight loss.  Compared to his last evaluation he has gained 4 pounds.  Presently he is walking 2 to 2-1/2 miles per day typically 6 days/week.  He continues to be on isosorbide 15 mg of metoprolol tartrate 12.5 mg twice a day.  He is on atorvastatin 40 mg for  hyperlipidemia.  LDL cholesterol on May 18, 2021 was 62 with triglycerides 133 total cholesterol 124 and HDL was low at 38.  Moderately obese.  Weight loss was recommended.  By Dr. Junious Silk for urology and is undergoing CT imaging and 5-year follow-up cystoscopy for his remote bladder cancer.  Cardiac wise he is stable.  I will see him in 1 year for reevaluation or sooner as needed.   Medication Adjustments/Labs and Tests Ordered: Current medicines are reviewed at length with the patient today.  Concerns regarding medicines are outlined above.  Medication changes, Labs and Tests ordered today are listed in the Patient Instructions below. There are no Patient Instructions on file for this visit.   Signed, Shelva Majestic, MD  06/28/2022 3:01 PM    [facility] [address] [place], [place],   [address] Phone: [phone]

## 2022-06-29 DIAGNOSIS — N200 Calculus of kidney: Secondary | ICD-10-CM | POA: Diagnosis not present

## 2022-06-29 DIAGNOSIS — E349 Endocrine disorder, unspecified: Secondary | ICD-10-CM | POA: Diagnosis not present

## 2022-06-30 ENCOUNTER — Encounter: Payer: Self-pay | Admitting: Cardiovascular Disease

## 2022-07-27 DIAGNOSIS — E291 Testicular hypofunction: Secondary | ICD-10-CM | POA: Diagnosis not present

## 2022-08-04 DIAGNOSIS — H6121 Impacted cerumen, right ear: Secondary | ICD-10-CM | POA: Diagnosis not present

## 2022-08-16 ENCOUNTER — Other Ambulatory Visit: Payer: Self-pay | Admitting: Cardiovascular Disease

## 2022-08-23 DIAGNOSIS — K5900 Constipation, unspecified: Secondary | ICD-10-CM | POA: Diagnosis not present

## 2022-08-23 DIAGNOSIS — I10 Essential (primary) hypertension: Secondary | ICD-10-CM | POA: Diagnosis not present

## 2022-08-25 ENCOUNTER — Other Ambulatory Visit: Payer: Self-pay | Admitting: Cardiovascular Disease

## 2022-08-30 DIAGNOSIS — E291 Testicular hypofunction: Secondary | ICD-10-CM | POA: Diagnosis not present

## 2022-09-14 ENCOUNTER — Encounter: Payer: Self-pay | Admitting: Nurse Practitioner

## 2022-09-14 ENCOUNTER — Ambulatory Visit: Payer: PPO | Admitting: Nurse Practitioner

## 2022-09-14 ENCOUNTER — Ambulatory Visit (INDEPENDENT_AMBULATORY_CARE_PROVIDER_SITE_OTHER)
Admission: RE | Admit: 2022-09-14 | Discharge: 2022-09-14 | Disposition: A | Discharge status: Home / Self Care | Payer: PPO | Source: Ambulatory Visit | Attending: Nurse Practitioner | Admitting: Nurse Practitioner

## 2022-09-14 VITALS — BP 126/74 | HR 81 | Ht 69.5 in | Wt 245.0 lb

## 2022-09-14 DIAGNOSIS — R197 Diarrhea, unspecified: Secondary | ICD-10-CM | POA: Diagnosis not present

## 2022-09-14 DIAGNOSIS — R194 Change in bowel habit: Secondary | ICD-10-CM | POA: Diagnosis not present

## 2022-09-14 DIAGNOSIS — K5900 Constipation, unspecified: Secondary | ICD-10-CM | POA: Diagnosis not present

## 2022-09-14 NOTE — Progress Notes (Addendum)
09/14/2022 RAIJON LINDFORS [id number] 1950-11-19   Chief Complaint: Constipation   History of Present Illness: Ryan Scott is a 71 year male with a past medical history of hypertension, hyperlipidemia, coronary artery disease s/p PTCA and DES 2019 on Plavix and ASA, kidney stones, bladder cancer, arthritis, gallstones, gallbladder polyp, hepatic steatosis and colon polyps.  He is known by Dr. Loletha Carrow.  He presents today with complaints of diarrhea which started 07/16/2022.  At that time, he awakened in the morning and passed 2-5 nonbloody watery diarrhea bowel movements without associated abdominal pain.  No specific food or stress triggers. No antibiotic use within the past 6 months. He had a lot of hiccups and burping the prior evening which abated within a few hours.  No nausea or vomiting.  He continued to have 2-5 episodes of diarrhea daily for the next 2 to 3 weeks.  He took Pepto-Bismol few times which turned his stool black so he stopped taking it.  He then felt constipated and took senna a few times which resulted in passing a moderate amount of loose stool and gas.  He was seen by his PCP who instructed him to take MiraLAX twice daily which she took for 2 weeks which resulted in increased stool output.  He stopped taking the MiraLAX 1 week ago.  He continues to feel constipated.  He sits on the commode 3-4 times daily for 30-minute intervals and passes solid stool, loose stool and gas.  He does not feel emptied.  He feels bloated.  This morning, he went to the bathroom and felt like he needed to pass a bowel movement but did not.  He stopped taking MiraLAX 1 week ago and he is now having 2 bowel movements daily.  No rectal bleeding.  No further black stools since he stopped taking Pepto-Bismol.  He is on Plavix due to having heart disease with a coronary stent.  He had labs done by his PCP 1 or 2 weeks ago which she reported were normal.  He underwent a colonoscopy 05/24/2022 which showed a  redundant, tortuous colon with left-sided diverticulosis and tubular adenomatous/sessile serrated polyps measuring 4 to 10 mm were removed from the colon.  He was advised to repeat a colonoscopy in 3 years.   Colonoscopy 05/24/2022: - Redundant colon. - Tortuous colon. - Diverticulosis in the left colon. - Three 4 to 6 mm polyps in the transverse colon, removed with a cold snare. Resected and retrieved. - One 10 mm polyp in the cecum, removed with a hot snare. Resected and retrieved. - One 5 mm polyp in the rectum, removed with a cold snare. Resected and retrieved. - One 5 mm polyp in the cecum, removed with a cold snare. Resected and retrieved. - Two 6 to 8 mm polyps in the transverse colon, removed with a cold snare. Resected and retrieved. - Internal hemorrhoids. - The examination was otherwise normal on direct and retroflexion views. - 3 year colonoscopy recall 1. Surgical [P], colon, transverse, polyp (3) - TUBULAR ADENOMA, FRAGMENTS. 2. Surgical [P], colon, cecum, rectum, polyp (2) - TUBULAR ADENOMA, FRAGMENTS. 3. Surgical [P], colon, cecum polyp x 1, polyp (1) - SESSILE SERRATED POLYP WITHOUT DYSPLASIA 4. Surgical [P], colon, transverse, polyp (2) - TUBULAR ADENOMA. - SESSILE SERRATED POLYP WITHOUT DYSPLASIA  Past Medical History:  Diagnosis Date   Arthritis    "back, neck, shoulders" (12/19/2017)   Bladder cancer (HCC)    Borderline hypertension    BPH (benign prostatic hyperplasia)  Chronic lower back pain    ED (erectile dysfunction)    High cholesterol    "started BP RX 12/18/2017"   History of adenomatous polyp of colon    tubular adenoma   History of kidney stones    Hypertension    Hypogonadism male    Left ureteral stone    Lower urinary tract symptoms (LUTS)    RBBB (right bundle branch block)    Past Surgical History:  Procedure Laterality Date   CARDIOVASCULAR STRESS TEST  10/16/2012   normal myocardial perfusion study,  no ischemia/  not gated    COLONOSCOPY  last one 07-26-2013   CORONARY STENT INTERVENTION N/A 12/19/2017   Procedure: CORONARY STENT INTERVENTION;  Surgeon: [name], MD;  Location: [facility];  Service: Cardiovascular;  Laterality: N/A;   CYSTOSCOPY/RETROGRADE/URETEROSCOPY/STONE EXTRACTION WITH BASKET  1989   KNEE SURGERY Left 08/2021   LEFT HEART CATH AND CORONARY ANGIOGRAPHY N/A 12/19/2017   Procedure: LEFT HEART CATH AND CORONARY ANGIOGRAPHY;  Surgeon: [name], MD;  Location: [facility];  Service: Cardiovascular;  Laterality: N/A;  LAD    PROSTATE SURGERY  2002   PROSTATE SURGERY  2002   REFRACTIVE SURGERY Bilateral    SHOULDER ARTHROSCOPY WITH SUBACROMIAL DECOMPRESSION, ROTATOR CUFF REPAIR AND BICEP TENDON REPAIR Left 2013   SHOULDER SURGERY  09/2020   rotator cuff surgery   TONSILLECTOMY  ~ 1956   TRANSTHORACIC ECHOCARDIOGRAM  10/16/2012   normal LVF, ef 55-65%,  mild AV sclerosis without stenosis,  mild MR and TR,  mild LAE,  trivial PR   TRANSURETHRAL RESECTION OF BLADDER TUMOR WITH GYRUS (TURBT-GYRUS)  1989   and URETEROSCOPIC STONE EXTRACTION    Current Outpatient Medications on File Prior to Visit  Medication Sig Dispense Refill   allopurinol (ZYLOPRIM) 100 MG tablet Take 100 mg by mouth daily.     aspirin 81 MG chewable tablet Chew 1 tablet (81 mg total) by mouth daily.     atorvastatin (LIPITOR) 40 MG tablet TAKE 1 TABLET BY MOUTH EVERY DAY 90 tablet 3   clopidogrel (PLAVIX) 75 MG tablet TAKE 1 TABLET BY MOUTH EVERY DAY 90 tablet 1   Cyanocobalamin (VITAMIN B12) 1000 MCG TBCR Take 1 tablet by mouth daily.     ezetimibe (ZETIA) 10 MG tablet TAKE 1 TABLET BY MOUTH EVERY DAY 90 tablet 3   isosorbide mononitrate (IMDUR) 30 MG 24 hr tablet TAKE 1/2 TABLET (15 MG TOTAL) BY MOUTH DAILY. 45 tablet 2   metoprolol tartrate (LOPRESSOR) 25 MG tablet TAKE 1/2 TABLET BY MOUTH 2 TIMES DAILY 90 tablet 1   Multiple Vitamin (MULTIVITAMIN) tablet Take 1 tablet by mouth daily.      testosterone cypionate (DEPOTESTOTERONE CYPIONATE) 200 MG/ML injection Inject 200 mg into the muscle every 28 (twenty-eight) days.      nitroGLYCERIN (NITROSTAT) 0.4 MG SL tablet Place 1 tablet (0.4 mg total) under the tongue every 5 (five) minutes as needed for chest pain. 25 tablet 3   No current facility-administered medications on file prior to visit.   No Known Allergies  Current Medications, Allergies, Past Medical History, Past Surgical History, Family History and Social History were reviewed in [employer] record.  Review of Systems:   Constitutional: Negative for fever, sweats, chills or weight loss.  Respiratory: Negative for shortness of breath.   Cardiovascular: Negative for chest pain, palpitations and leg swelling.  Gastrointestinal: See HPI.  Musculoskeletal: Negative for back pain or muscle aches.  Neurological: Negative for dizziness, headaches or paresthesias.    Physical Exam: Ht 5' 9.5" (1.765 m)   Wt 245 lb (111.1 kg)   BMI 35.66 kg/m  General: 71 year old male in no acute distress. Head: Normocephalic and atraumatic. Eyes: No scleral icterus. Conjunctiva pink . Ears: Normal auditory acuity. Mouth: Dentition intact. No ulcers or lesions.  Lungs: Clear throughout to auscultation. Heart: Regular rate and rhythm, distant S1, S2, ? soft systolic murmur. Abdomen: Soft with mild gaseous distention.  Nontender.  No masses or hepatomegaly. Normal bowel sounds x 4 quadrants.  Rectal: No external hemorrhoids.  Rectal vault without fecal impaction.  No blood or palpable mass. DD present during exam.  Musculoskeletal: Symmetrical with no gross deformities. Extremities: No edema. Neurological: Alert oriented x 4. No focal deficits.  Psychological: Alert and cooperative. Normal mood and affect  Assessment and Recommendations:  68) 71 year old male with watery nonbloody diarrhea 2-5 times daily which started 07/16/2022 which lasted for few weeks then  he became constipated.  He took MiraLAX twice daily for few weeks with some improvement but continues to feel bloated and sits on the commode or 30-minute intervals until he passes gas, loose and solid stool.  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  Colonoscopy 04/2022 without evidence of colitis.  -Abdominal x-ray today to rule out stool-filled colon -Request copy of CBC, CMP done by PCP -Consider fecal calprotectin, CRP and sed rate if no improvement  -MiraLAX nightly as tolerated for now -Further recommendations to be determined after abdominal x-ray reviewed -Discussed OTC probiotic of choice once daily -Advised to avoid Pepto-Bismol  2) History of colon polyps.  Colonoscopy 04/2022 identified 8 tubular adenomatous/sessile serrated polyps removed from the colon and rectum. -Next colonoscopy due 04/2025  3) CAD s/p PTCA and DES 2019 on Plavix and ASA  Today's encounter was 25 minutes which included precharting, chart/result review, history/exam, face-to-face time used for counseling, formulating a treatment plan with follow-up and documentation.  Labs 08/23/2022 received: Glucose 94.  BUN 19.  Creatinine 1.26.  Sodium 138.  Potassium 5.3.  Calcium 9.2.  Total protein 6.8.  Albumin 4.4.  Total bili 2.1.  Alk phos 49.  AST 24.  ALT 23.  Patient will be contacted for additional lab that he is to include a CBC, hepatic panel (to verify if indirect bili > direct bili), CRP and sed rate.

## 2022-09-14 NOTE — Patient Instructions (Signed)
Your provider has requested that you have an abdominal x ray before leaving today. Please go to the basement floor to our Radiology department for the test.   Miralax- every night as needed  Due to recent changes in healthcare laws, you may see the results of your imaging and laboratory studies on MyChart before your provider has had a chance to review them.  We understand that in some cases there may be results that are confusing or concerning to you. Not all laboratory results come back in the same time frame and the provider may be waiting for multiple results in order to interpret others.  Please give [place] 48 hours in order for your provider to thoroughly review all the results before contacting the office for clarification of your results.    Thank you for trusting me with your gastrointestinal care!   Carl Best, CRNP

## 2022-09-15 NOTE — Progress Notes (Signed)
____________________________________________________________  Attending physician addendum:  Thank you for sending this case to me. I have reviewed the entire note and agree with the plan.  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  Less likely SIBO.  If symptoms do not resolve and he needs further testing, I recommend fecal lactoferrin rather than calprotectin.  Wilfrid Lund, MD  ____________________________________________________________

## 2022-09-21 ENCOUNTER — Other Ambulatory Visit: Payer: Self-pay

## 2022-09-21 ENCOUNTER — Telehealth: Payer: Self-pay

## 2022-09-21 DIAGNOSIS — R197 Diarrhea, unspecified: Secondary | ICD-10-CM

## 2022-09-21 DIAGNOSIS — R194 Change in bowel habit: Secondary | ICD-10-CM

## 2022-09-21 DIAGNOSIS — K5900 Constipation, unspecified: Secondary | ICD-10-CM

## 2022-09-21 NOTE — Telephone Encounter (Signed)
Pt made aware of Carl Best NP recommendations: Orders for labs placed in Epic. Pt made aware: Location to lab provided: Pt verbalized understanding with all questions answered.

## 2022-09-21 NOTE — Telephone Encounter (Signed)
Message Received: Today Noralyn Pick, NP  Gillermina Hu, RN Remo Lipps, please contact the patient and let him know I received his labs from his PCP.  His labs showed he has elevated total bilirubin level with normal alk phos, AST/ALT levels. I suspect he has Gilbert's syndrome.  I will check direct/indirect bilirubin levels to confirm if he has Gilbert's syndrome. Please contact the patient and let him know I would like him to have a CBC, hepatic panel, CRP and sed rate levels.  Thank you

## 2022-09-22 ENCOUNTER — Other Ambulatory Visit (INDEPENDENT_AMBULATORY_CARE_PROVIDER_SITE_OTHER): Payer: PPO

## 2022-09-22 DIAGNOSIS — K5900 Constipation, unspecified: Secondary | ICD-10-CM | POA: Diagnosis not present

## 2022-09-22 DIAGNOSIS — R194 Change in bowel habit: Secondary | ICD-10-CM

## 2022-09-22 DIAGNOSIS — R197 Diarrhea, unspecified: Secondary | ICD-10-CM

## 2022-09-22 LAB — CBC WITH DIFFERENTIAL/PLATELET
Basophils Absolute: 0.1 10*3/uL (ref 0.0–0.1)
Basophils Relative: 0.8 % (ref 0.0–3.0)
Eosinophils Absolute: 0.2 10*3/uL (ref 0.0–0.7)
Eosinophils Relative: 3.7 % (ref 0.0–5.0)
HCT: 48.8 % (ref 39.0–52.0)
Hemoglobin: 16.5 g/dL (ref 13.0–17.0)
Lymphocytes Relative: 27.2 % (ref 12.0–46.0)
Lymphs Abs: 1.8 10*3/uL (ref 0.7–4.0)
MCHC: 33.7 g/dL (ref 30.0–36.0)
MCV: 89.6 fl (ref 78.0–100.0)
Monocytes Absolute: 0.6 10*3/uL (ref 0.1–1.0)
Monocytes Relative: 8.6 % (ref 3.0–12.0)
Neutro Abs: 4 10*3/uL (ref 1.4–7.7)
Neutrophils Relative %: 59.7 % (ref 43.0–77.0)
Platelets: 188 10*3/uL (ref 150.0–400.0)
RBC: 5.45 Mil/uL (ref 4.22–5.81)
RDW: 14.7 % (ref 11.5–15.5)
WBC: 6.7 10*3/uL (ref 4.0–10.5)

## 2022-09-22 LAB — HEPATIC FUNCTION PANEL
ALT: 26 U/L (ref 0–53)
AST: 23 U/L (ref 0–37)
Albumin: 4.2 g/dL (ref 3.5–5.2)
Alkaline Phosphatase: 48 U/L (ref 39–117)
Bilirubin, Direct: 0.3 mg/dL (ref 0.0–0.3)
Total Bilirubin: 1.7 mg/dL — ABNORMAL HIGH (ref 0.2–1.2)
Total Protein: 7 g/dL (ref 6.0–8.3)

## 2022-09-22 LAB — SEDIMENTATION RATE: Sed Rate: 9 mm/hr (ref 0–20)

## 2022-09-22 LAB — C-REACTIVE PROTEIN: CRP: <1 mg/dL (ref 0.5–20.0)

## 2022-09-27 DIAGNOSIS — Z6835 Body mass index (BMI) 35.0-35.9, adult: Secondary | ICD-10-CM | POA: Diagnosis not present

## 2022-09-27 DIAGNOSIS — J069 Acute upper respiratory infection, unspecified: Secondary | ICD-10-CM | POA: Diagnosis not present

## 2022-09-28 ENCOUNTER — Other Ambulatory Visit: Payer: Self-pay | Admitting: Cardiovascular Disease

## 2022-10-05 DIAGNOSIS — E291 Testicular hypofunction: Secondary | ICD-10-CM | POA: Diagnosis not present

## 2022-10-23 ENCOUNTER — Other Ambulatory Visit: Payer: Self-pay | Admitting: Cardiovascular Disease

## 2022-10-24 ENCOUNTER — Telehealth: Payer: Self-pay | Admitting: Nurse Practitioner

## 2022-10-24 NOTE — Telephone Encounter (Signed)
Inbound call from patient stating that he seen [facility] on 12/20 and was given the recommendations of taking MiraLAX and Dulcolax. Patient stated that he has not seen any improvement and is wanting to know what the next steps are. Please advise.

## 2022-10-24 NOTE — Telephone Encounter (Signed)
Pt stated that he is having the same symptoms as he was at recent office visit on 09/15/2023: Pt stated that he has been taking the Miralax nightly and dulcolax every 3rd day: Pt  stated that he is having BM's daily averaging about 5 a day although  they are all liquid: Pt stated that he has only had 2 solid BM in the last 2 months:  Please advise:

## 2022-10-25 ENCOUNTER — Other Ambulatory Visit: Payer: Self-pay

## 2022-10-25 DIAGNOSIS — R197 Diarrhea, unspecified: Secondary | ICD-10-CM

## 2022-10-25 DIAGNOSIS — R194 Change in bowel habit: Secondary | ICD-10-CM

## 2022-10-25 MED ORDER — SACCHAROMYCES BOULARDII 250 MG PO CAPS: 250.0000 mg | ORAL_CAPSULE | Freq: Two times a day (BID) | ORAL | 0 refills | Status: AC

## 2022-10-25 NOTE — Telephone Encounter (Signed)
Pt made aware of Dr. Loletha Carrow and Carl Best NP recommendations: Order for lab placed in [place]. Pt made aware. Location to lab provided Prescription sent to pharmacy: Pt made aware Pt verbalized understanding with all questions answered.

## 2022-10-25 NOTE — Telephone Encounter (Signed)
Ryan Scott, per Dr. Loletha Carrow' prior recommendation if sx persist, pls enter order for fecal lactoferrin level (assess for inflammatory cells). Pls send patient to our lab to pick up the container and drop if off at his earliest convenience. If affordable, have him take Florastor probiotic on po bid x 2 to 4 weeks.  Further recommendations to be determined after fecal lactoferrin level completed. THX

## 2022-10-27 ENCOUNTER — Other Ambulatory Visit: Payer: PPO

## 2022-10-27 DIAGNOSIS — R194 Change in bowel habit: Secondary | ICD-10-CM | POA: Diagnosis not present

## 2022-10-27 DIAGNOSIS — R197 Diarrhea, unspecified: Secondary | ICD-10-CM | POA: Diagnosis not present

## 2022-10-28 LAB — FECAL LACTOFERRIN, QUANT
Fecal Lactoferrin: POSITIVE — AB
MICRO NUMBER:: 14505758
SPECIMEN QUALITY:: ADEQUATE

## 2022-10-31 ENCOUNTER — Other Ambulatory Visit: Payer: Self-pay

## 2022-10-31 DIAGNOSIS — R197 Diarrhea, unspecified: Secondary | ICD-10-CM

## 2022-11-01 ENCOUNTER — Other Ambulatory Visit: Payer: PPO

## 2022-11-01 DIAGNOSIS — R197 Diarrhea, unspecified: Secondary | ICD-10-CM

## 2022-11-03 LAB — CLOSTRIDIUM DIFFICILE BY PCR: Toxigenic C. Difficile by PCR: NEGATIVE

## 2022-11-04 ENCOUNTER — Other Ambulatory Visit: Payer: Self-pay

## 2022-11-04 DIAGNOSIS — R197 Diarrhea, unspecified: Secondary | ICD-10-CM

## 2022-11-04 DIAGNOSIS — K52839 Microscopic colitis, unspecified: Secondary | ICD-10-CM

## 2022-11-04 MED ORDER — BUDESONIDE 3 MG PO CPEP: 9.0000 mg | ORAL_CAPSULE | Freq: Every day | ORAL | 1 refills | Status: DC

## 2022-11-04 MED ORDER — DICYCLOMINE HCL 10 MG PO CAPS: 10.0000 mg | ORAL_CAPSULE | Freq: Two times a day (BID) | ORAL | 1 refills | Status: DC

## 2022-11-09 ENCOUNTER — Other Ambulatory Visit: Payer: Self-pay | Admitting: Cardiovascular Disease

## 2022-11-21 NOTE — Telephone Encounter (Signed)
Pt contacted in regard to my chart message. Pt stated that he feels that his symptoms have worsened since starting the medication. Pt last BM this AM, very little, lots of bloating., Please see my chart message from  pt also: Please advise

## 2022-11-23 NOTE — Telephone Encounter (Signed)
Pt notified of Dr. Loletha Carrow recommendations:  stop both the budesonide and the dicyclomine.  Pt office visit rescheduled to 11/30/2022 at 1:20 pm to see Dr. Loletha Carrow   Pt made aware.  Pt made aware that I will contact him tomorrow morning  to schedule CT scan abdomen and pelvis

## 2022-11-23 NOTE — Telephone Encounter (Signed)
Please have him stop both the budesonide and the dicyclomine.  I would like him to have a CT scan abdomen and pelvis by mid next week.  Then clinic visit with me moved up to March 6th afternoon, where I currently have an open slot.  - HD

## 2022-11-23 NOTE — Telephone Encounter (Signed)
Dr. Loletha Carrow, see patient's mychart message. His symptoms have worsened on Budesonide and Dicyclomine. Please let me know if you want patient to have a CTAP and/or colonoscopy. He has a follow up appointment with you mid March. THX.

## 2022-11-24 ENCOUNTER — Other Ambulatory Visit: Payer: Self-pay

## 2022-11-24 DIAGNOSIS — R109 Unspecified abdominal pain: Secondary | ICD-10-CM

## 2022-11-24 DIAGNOSIS — R140 Abdominal distension (gaseous): Secondary | ICD-10-CM

## 2022-11-24 DIAGNOSIS — K5900 Constipation, unspecified: Secondary | ICD-10-CM

## 2022-11-24 DIAGNOSIS — R194 Change in bowel habit: Secondary | ICD-10-CM

## 2022-11-24 NOTE — Telephone Encounter (Signed)
Please see notes below.  Pt was scheduled for first available CT at any of our locations. Pt was scheduled for 12/01/2022 at 2:00 PM at [facility] . Pt to arrive at 1:30 PM. Nothing to eat or drink 4 hours prior starting at 10:00 AM. Pt made aware    Pt verbalized understanding with all questions answered.

## 2022-11-26 ENCOUNTER — Other Ambulatory Visit: Payer: Self-pay | Admitting: Gastroenterology

## 2022-11-26 DIAGNOSIS — K52839 Microscopic colitis, unspecified: Secondary | ICD-10-CM

## 2022-11-26 DIAGNOSIS — R197 Diarrhea, unspecified: Secondary | ICD-10-CM

## 2022-11-30 ENCOUNTER — Ambulatory Visit: Payer: PPO | Admitting: Gastroenterology

## 2022-11-30 ENCOUNTER — Encounter: Payer: Self-pay | Admitting: Gastroenterology

## 2022-11-30 VITALS — BP 136/86 | HR 54 | Ht 69.5 in

## 2022-11-30 DIAGNOSIS — R194 Change in bowel habit: Secondary | ICD-10-CM | POA: Diagnosis not present

## 2022-11-30 DIAGNOSIS — R140 Abdominal distension (gaseous): Secondary | ICD-10-CM | POA: Diagnosis not present

## 2022-11-30 NOTE — Progress Notes (Addendum)
[facility] progress note:  History: Ryan Scott 11/30/2022  Referring provider: Lurline Del, DO  Reason for consult/chief complaint: Gas (Pt states he is having a lot of gas ) and Constipation (Pt is still have problems with having BMs)   Subjective  HPI: I last saw Ryan Scott for a surveillance colonoscopy August 2023, removed 7 tubular adenomas and sessile serrated polyps.  Redundant colon, challenging scope passage.  He was seen by our APP December 20 for persistent alteration of bowel habits that began acutely with diarrhea and bloating in mid to late October.  It initially started with diarrhea with no preceding antibiotic use or other change in medicines.  Different OTC remedies were tried, and he became constipated more bloated.  He went back and forth with those treatments for a while.  Overall it sounded most like some postinfectious syndrome, though it was difficult to tell whether his predominant bowel habit was constipation or diarrhea.  Fecal lactoferrin positive, C. difficile negative. Trial of Entocort and dicyclomine recently prescribed as empiric therapy for possible postinfectious microscopic colitis.  Patient contacted [place] several days later with sitting constipation,, bloating and incomplete evacuation.  Advised to stop dicyclomine continue budesonide as well as orders for CTAP.  (Which is scheduled for tomorrow) ______________________ Ryan Scott told me more the history today.  Things did start rather acutely in October when he woke up on a Sunday after a long bout of hiccups and then had loose watery stool for couple of days.  He has not had alteration of bowel habits since then with passage of small amount of fecal material, feelings of incomplete evacuation and bloating.  At this point he is off both dicyclomine and budesonide, was having difficulty moving his bowels, went back on MiraLAX capful every night.  With that he has a loose BM with a lot of gas  first thing in the morning, then several smaller semiformed to loose passages stool but still feels incompletely evacuated.  Sometimes intermittent lower abdominal cramping, no rectal bleeding.  Appetite good, no vomiting, weight stable.  ROS:  Review of Systems Denies chest pain dyspnea fever, weight loss. Remainder systems negative except as above  Past Medical History: Past Medical History:  Diagnosis Date   Arthritis    "back, neck, shoulders" (12/19/2017)   Bladder cancer (HCC)    Borderline hypertension    BPH (benign prostatic hyperplasia)    Chronic lower back pain    ED (erectile dysfunction)    High cholesterol    "started BP RX 12/18/2017"   History of adenomatous polyp of colon    tubular adenoma   History of kidney stones    Hypertension    Hypogonadism male    Left ureteral stone    Lower urinary tract symptoms (LUTS)    RBBB (right bundle branch block)      Past Surgical History: Past Surgical History:  Procedure Laterality Date   CARDIOVASCULAR STRESS TEST  10/16/2012   normal myocardial perfusion study,  no ischemia/  not gated   COLONOSCOPY  last one 07-26-2013   CORONARY STENT INTERVENTION N/A 12/19/2017   Procedure: CORONARY STENT INTERVENTION;  Surgeon: [name], MD;  Location: [facility];  Service: Cardiovascular;  Laterality: N/A;   CYSTOSCOPY/RETROGRADE/URETEROSCOPY/STONE EXTRACTION WITH BASKET  1989   KNEE SURGERY Left 08/2021   LEFT HEART CATH AND CORONARY ANGIOGRAPHY N/A 12/19/2017   Procedure: LEFT HEART CATH AND CORONARY ANGIOGRAPHY;  Surgeon: [name], MD;  Location: [facility]  INVASIVE [facility];  Service: Cardiovascular;  Laterality: N/A;  LAD    PROSTATE SURGERY  2002   PROSTATE SURGERY  2002   REFRACTIVE SURGERY Bilateral    SHOULDER ARTHROSCOPY WITH SUBACROMIAL DECOMPRESSION, ROTATOR CUFF REPAIR AND BICEP TENDON REPAIR Left 2013   SHOULDER SURGERY  09/2020   rotator cuff surgery   TONSILLECTOMY  ~ 1956   TRANSTHORACIC  ECHOCARDIOGRAM  10/16/2012   normal LVF, ef 55-65%,  mild AV sclerosis without stenosis,  mild MR and TR,  mild LAE,  trivial PR   TRANSURETHRAL RESECTION OF BLADDER TUMOR WITH GYRUS (TURBT-GYRUS)  1989   and URETEROSCOPIC STONE EXTRACTION     Family History: Family History  Problem Relation Age of Onset   Breast cancer Mother    Thyroid disease Mother    Heart attack Father    Heart disease Father    Arthritis Father    Hypertension Father    Arthritis Sister    Stroke Maternal Grandmother    Lung cancer Maternal Grandfather    Other Paternal Grandmother        brain tumor   Aneurysm Paternal Grandfather    Colon cancer Neg Hx    Esophageal cancer Neg Hx    Rectal cancer Neg Hx    Stomach cancer Neg Hx    Liver cancer Neg Hx    Pancreatic cancer Neg Hx     Social History: Social History   Socioeconomic History   Marital status: Married    Spouse name: Not on file   Number of children: Not on file   Years of education: Not on file   Highest education level: Not on file  Occupational History   Not on file  Tobacco Use   Smoking status: Never   Smokeless tobacco: Former    Types: Chew    Quit date: 1991  Vaping Use   Vaping Use: Never used  Substance and Sexual Activity   Alcohol use: Yes    Alcohol/week: 2.0 standard drinks of alcohol    Types: 2 Standard drinks or equivalent per week   Drug use: Not Currently   Sexual activity: Not on file  Other Topics Concern   Not on file  Social History Narrative   Not on file   Social Determinants of Health   Financial Resource Strain: Not on file  Food Insecurity: Not on file  Transportation Needs: Not on file  Physical Activity: Not on file  Stress: Not on file  Social Connections: Not on file    Allergies: No Known Allergies  Outpatient Meds: Current Outpatient Medications  Medication Sig Dispense Refill   allopurinol (ZYLOPRIM) 100 MG tablet Take 100 mg by mouth daily.     aspirin 81 MG chewable  tablet Chew 1 tablet (81 mg total) by mouth daily.     atorvastatin (LIPITOR) 40 MG tablet TAKE 1 TABLET BY MOUTH EVERY DAY 90 tablet 3   clopidogrel (PLAVIX) 75 MG tablet TAKE 1 TABLET BY MOUTH EVERY DAY 90 tablet 3   ezetimibe (ZETIA) 10 MG tablet TAKE 1 TABLET BY MOUTH EVERY DAY 90 tablet 3   isosorbide mononitrate (IMDUR) 30 MG 24 hr tablet TAKE 1/2 TABLET (15 MG TOTAL) BY MOUTH DAILY. 45 tablet 3   metoprolol tartrate (LOPRESSOR) 25 MG tablet TAKE 1/2 TABLET TWICE A DAY BY MOUTH 90 tablet 1   Multiple Vitamin (MULTIVITAMIN) tablet Take 1 tablet by mouth daily.     testosterone cypionate (DEPOTESTOTERONE CYPIONATE) 200 MG/ML injection Inject  200 mg into the muscle every 28 (twenty-eight) days.      budesonide (ENTOCORT EC) 3 MG 24 hr capsule Take 3 capsules (9 mg total) by mouth daily. (Patient not taking: Reported on 11/30/2022) 90 capsule 1   Cyanocobalamin (VITAMIN B12) 1000 MCG TBCR Take 1 tablet by mouth daily.     dicyclomine (BENTYL) 10 MG capsule Take 1 capsule (10 mg total) by mouth 2 (two) times daily. (Patient not taking: Reported on 11/30/2022) 60 capsule 1   nitroGLYCERIN (NITROSTAT) 0.4 MG SL tablet Place 1 tablet (0.4 mg total) under the tongue every 5 (five) minutes as needed for chest pain. 25 tablet 3   No current facility-administered medications for this visit.      ___________________________________________________________________ Objective   Exam:  BP 136/86   Pulse (!) 54   Ht 5' 9.5" (1.765 m)   BMI 35.66 kg/m  Wt Readings from Last 3 Encounters:  09/14/22 245 lb (111.1 kg)  06/28/22 240 lb 12.8 oz (109.2 kg)  05/24/22 240 lb (108.9 kg)    General: Well-appearing Eyes: sclera anicteric, no redness ENT: oral mucosa moist without lesions, no cervical or supraclavicular lymphadenopathy CV: Regular without appreciable murmur, no JVD, no peripheral edema Resp: clear to auscultation bilaterally, normal RR and effort noted GI: soft, no tenderness, with active  bowel sounds. No guarding or palpable organomegaly noted. Skin; warm and dry, no rash or jaundice noted Neuro: awake, alert and oriented x 3. Normal gross motor function and fluent speech  Labs:  Stool study results as noted above  Assessment: Encounter Diagnoses  Name Primary?   Abdominal bloating Yes   Altered bowel habits    Puzzling that he has had this rather acute onset of change in bowel habits with no clear trigger.  I still wonder if it may have been a viral gastroenteritis with his description of initial symptoms, and that he may have a postinfectious syndrome.  SIBO is a consideration, but strange to have acute onset and no other clear risk factors.  At this point, it seems like constipation is the primary problem which is also causing some intermittent loose stool as overflow.  He is also bloated with flatulence.  He may have developed some maldigestion such as lactose intolerance or other after an acute infectious illness.  Less likely partial obstructive problem in the small bowel, which is the reason for CT tomorrow.  It also sounds like the capful of MiraLAX every night may be a little too much if he is having lots of gas and watery stool the following morning. It should be noted that his normal bowel habit when I last saw him was a bowel movement without difficulty every morning and sometimes another later in the day.  Stools formed without gas bloating or incomplete evacuation feelings. Plan: CT abdomen pelvis tomorrow, report to patient after review.  Scale back MiraLAX to half capful every night.  If above unrevealing or unhelpful, probable trial of metronidazole as empiric therapy for possible SIBO.  Written dietary advice regarding typical maldigestion triggers was given today.  32 minutes were spent on this encounter (including chart review, history/exam, counseling/coordination of care, and documentation) > 50% of that time was spent on counseling and coordination  of care.   [name]  CC: Referring provider noted above

## 2022-11-30 NOTE — Patient Instructions (Addendum)
Please follow up as needed. _______________________________________________________  Food Guidelines for those with chronic digestive trouble:  Many people have difficulty digesting certain foods, causing a variety of distressing and embarrassing symptoms such as abdominal pain, bloating and gas.  These foods may need to be avoided or consumed in small amounts.  Here are some tips that might be helpful for you.  1.   Lactose intolerance is the difficulty or complete inability to digest lactose, the natural sugar in milk and anything made from milk.  This condition is harmless, common, and can begin any time during life.  Some people can digest a modest amount of lactose while others cannot tolerate any.  Also, not all dairy products contain equal amounts of lactose.  For example, hard cheeses such as parmesan have less lactose than soft cheeses such as cheddar.  Yogurt has less lactose than milk or cheese.  Many packaged foods (even many brands of bread) have milk, so read ingredient lists carefully.  It is difficult to test for lactose intolerance, so just try avoiding lactose as much as possible for a week and see what happens with your symptoms.  If you seem to be lactose intolerant, the best plan is to avoid it (but make sure you get calcium from another source).  The next best thing is to use lactase enzyme supplements, available over the counter everywhere.  Just know that many lactose intolerant people need to take several tablets with each serving of dairy to avoid symptoms.  Lastly, a lot of restaurant food is made with milk or butter.  Many are things you might not suspect, such as mashed potatoes, rice and pasta (cooked with butter) and "grilled" items.  If you are lactose intolerant, it never hurts to ask your server what has milk or butter.  2.   Fiber is an important part of your diet, but not all fiber is well-tolerated.  Insoluble fiber such as bran is often consumed by normal gut bacteria  and converted into gas.  Soluble fiber such as oats, squash, carrots and green beans are typically tolerated better.  3.   Some types of carbohydrates can be poorly digested.  Examples include: fructose (apples, cherries, pears, raisins and other dried fruits), fructans (onions, zucchini, large amounts of wheat), sorbitol/mannitol/xylitol and sucralose/Splenda (common artificial sweeteners), and raffinose (lentils, broccoli, cabbage, asparagus, brussel sprouts, many types of beans).  Do a Development worker, community for [employer] and you will find helpful information. Beano, a dietary supplement, will often help with raffinose-containing foods.  As with lactase tablets, you may need several per serving.  4.   Whenever possible, avoid processed food&meats and chemical additives.  High fructose corn syrup, a common sweetener, may be difficult to digest.  Eggs and soy (comes from the soybean, and added to many foods now) are other common bloating/gassy foods.  5.  Regarding gluten:  gluten is a protein mainly found in wheat, but also rye and barley.  There is a condition called celiac sprue, which is an inflammatory reaction in the small intestine causing a variety of digestive symptoms.  Blood testing is highly reliable to look for this condition, and sometimes upper endoscopy with small bowel biopsies may be necessary to make the diagnosis.  Many patients who test negative for celiac sprue report improvement in their digestive symptoms when they switch to a gluten-free diet.  However, in these "non-celiac gluten sensitive" patients, the true role of gluten in their symptoms is unclear.  Reducing carbohydrates in  general may decrease the gas and bloating caused when gut bacteria consume carbs. Also, some of these patients may actually be intolerant of the baker's yeast in bread products rather than the gluten.  Flatbread and other reduced yeast breads might therefore be tolerated.  There is no specific testing available for  most food intolerances, which are discovered mainly by dietary elimination.  Please do not embark on a gluten free diet unless directed by your doctor, as it is highly restrictive, and may lead to nutritional deficiencies if not carefully monitored.  Lastly, beware of internet claims offering "personalized" tests for food intolerances.  Such testing has no reliable scientific evidence to support its reliability and correlation to symptoms.    6.  The best advice is old advice, especially for those with chronic digestive trouble - try to eat "clean".  Balanced diet, avoid processed food, plenty of fruits and vegetables, cut down the sugar, minimal alcohol, avoid tobacco. Make time to care for yourself, get enough sleep, exercise when you can, reduce stress.  Your guts will thank you for it.   - Dr. [facility]  _______________________________________________________  If your blood pressure at your visit was 140/90 or greater, please contact your primary care physician to follow up on this.  _______________________________________________________  If you are age 60 or older, your body mass index should be between 23-30. Your Body mass index is 35.66 kg/m. If this is out of the aforementioned range listed, please consider follow up with your Primary Care Provider.  If you are age 68 or younger, your body mass index should be between 19-25. Your Body mass index is 35.66 kg/m. If this is out of the aformentioned range listed, please consider follow up with your Primary Care Provider.   ________________________________________________________  The [facility] providers would like to encourage you to use [facility] to communicate with providers for non-urgent requests or questions.  Due to long hold times on the telephone, sending your provider a message by [facility] may be a faster and more efficient way to get a response.  Please allow 48 business hours for a response.  Please  remember that this is for non-urgent requests.  _______________________________________________________ It was a pleasure to see you today!  Thank you for trusting me with your gastrointestinal care!

## 2022-12-01 ENCOUNTER — Ambulatory Visit (HOSPITAL_BASED_OUTPATIENT_CLINIC_OR_DEPARTMENT_OTHER)
Admission: RE | Admit: 2022-12-01 | Discharge: 2022-12-01 | Disposition: A | Discharge status: Home / Self Care | Payer: PPO | Source: Ambulatory Visit | Attending: Gastroenterology | Admitting: Gastroenterology

## 2022-12-01 DIAGNOSIS — K5900 Constipation, unspecified: Secondary | ICD-10-CM

## 2022-12-01 DIAGNOSIS — R109 Unspecified abdominal pain: Secondary | ICD-10-CM | POA: Insufficient documentation

## 2022-12-01 DIAGNOSIS — R140 Abdominal distension (gaseous): Secondary | ICD-10-CM | POA: Insufficient documentation

## 2022-12-01 MED ORDER — IOHEXOL 300 MG/ML  SOLN
100.0000 mL | Freq: Once | INTRAMUSCULAR | Status: AC | PRN
  Administered 2022-12-01: 100 mL via INTRAVENOUS

## 2022-12-12 ENCOUNTER — Other Ambulatory Visit: Payer: Self-pay

## 2022-12-12 MED ORDER — METRONIDAZOLE 250 MG PO TABS: 250.0000 mg | ORAL_TABLET | Freq: Three times a day (TID) | ORAL | 0 refills | Status: AC

## 2022-12-13 ENCOUNTER — Ambulatory Visit: Payer: PPO | Admitting: Gastroenterology

## 2023-01-02 ENCOUNTER — Telehealth: Payer: Self-pay

## 2023-01-02 NOTE — Telephone Encounter (Signed)
MyChart message sent to patient to f/u on symptoms

## 2023-01-02 NOTE — Telephone Encounter (Signed)
-----   Message from Missy Sabins, RN sent at 12/12/2022  9:59 PM EDT ----- Regarding: Follow up after 2-week course of Flagyl Follow up after Flagyl RX

## 2023-01-04 NOTE — Telephone Encounter (Signed)
Called and spoke with patient to follow up on symptoms after completing Flagyl. Pt states that he completed Flagyl last Tuesday. Pt states that his symptoms have improved but have not complete;ly resolved. Pt states that he has about 2-3 BMs a day. In the morning he will pass a small amount of diarrhea, and by the third BM it is a solid stool. Patient states that he is passing gas and still has bloating. Denies any abdominal pain.

## 2023-01-05 NOTE — Telephone Encounter (Signed)
Thank you for the update.  He was being treated empirically for possible SIBO, and it sounds like he may have had that since there was improvement in symptoms.  I suspect it will continue to slowly improve.  HD

## 2023-01-06 NOTE — Telephone Encounter (Signed)
Called and left patient a detailed vm letting him know that I sent an update to Dr. Myrtie Neither and he thinks that his symptoms will continue to improve with time. Pt was advised to call back to schedule an office visit if he has ongoing issues or concerns.

## 2023-01-18 ENCOUNTER — Telehealth: Payer: Self-pay | Admitting: *Deleted

## 2023-01-18 NOTE — Telephone Encounter (Signed)
Pt has been scheduled for tele pre op appt 02/10/23 @ 9 am. Pt states surgeon may be moving his surgery up to 02/23/23. Med rec and consent are done.

## 2023-01-18 NOTE — Telephone Encounter (Signed)
   Name: Ryan Scott  DOB: December 19, 1950  MRN: [id number]  Primary Cardiologist: Nicki Guadalajara, MD   Preoperative team, please contact this patient and set up a phone call appointment on or after 02/04/2023 for further preoperative risk assessment. Please obtain consent and complete medication review. Thank you for your help.  I confirm that guidance regarding antiplatelet and oral anticoagulation therapy has been completed and, if necessary, noted below.  Per office protocol, if patient is without any new symptoms or concerns at the time of their virtual visit, he may hold Aspirin for 5-7 days and Plavix for 5 days prior to procedure. Please resume Aspirin and Plavix as soon as possible postprocedure, at the discretion of the surgeon.     Joylene Grapes, NP 01/18/2023, 1:05 PM [facility]

## 2023-01-18 NOTE — Telephone Encounter (Signed)
   Pre-operative Risk Assessment    Patient Name: Ryan Scott  DOB: October 10, 1950 MRN: [id number]      Request for Surgical Clearance    Procedure:   RIGHT TOAL HIP REPLACEMENET  Date of Surgery:  Clearance 04/06/23                                 Surgeon:  Teryl Lucy, MD Surgeon's Group or Practice Name:  [facility] Phone number:  [phone] Fax number:  [phone]   Type of Clearance Requested:   - Medical  - Pharmacy:  Hold Aspirin and Clopidogrel (Plavix) NOT INDICATED HOW LONG   Type of Anesthesia:  Spinal   Additional requests/questions:    Wilhemina Cash   01/18/2023, 11:57 AM

## 2023-01-18 NOTE — Telephone Encounter (Signed)
Pt has been scheduled for tele pre op appt 02/10/23 @ 9 am. Pt states surgeon may be moving his surgery up to 02/23/23. Med rec and consent are done.     Patient Consent for Virtual Visit        Ryan Scott has provided verbal consent on 01/18/2023 for a virtual visit (video or telephone).   CONSENT FOR VIRTUAL VISIT FOR:  Ryan Scott  By participating in this virtual visit I agree to the following:  I hereby voluntarily request, consent and authorize [facility] and its employed or contracted physicians, physician assistants, nurse practitioners or other licensed health care professionals (the Practitioner), to provide me with telemedicine health care services (the "Services") as deemed necessary by the treating Practitioner. I acknowledge and consent to receive the Services by the Practitioner via telemedicine. I understand that the telemedicine visit will involve communicating with the Practitioner through live audiovisual communication technology and the disclosure of certain medical information by electronic transmission. I acknowledge that I have been given the opportunity to request an in-person assessment or other available alternative prior to the telemedicine visit and am voluntarily participating in the telemedicine visit.  I understand that I have the right to withhold or withdraw my consent to the use of telemedicine in the course of my care at any time, without affecting my right to future care or treatment, and that the Practitioner or I may terminate the telemedicine visit at any time. I understand that I have the right to inspect all information obtained and/or recorded in the course of the telemedicine visit and may receive copies of available information for a reasonable fee.  I understand that some of the potential risks of receiving the Services via telemedicine include:  Delay or interruption in medical evaluation due to technological equipment failure or  disruption; Information transmitted may not be sufficient (e.g. poor resolution of images) to allow for appropriate medical decision making by the Practitioner; and/or  In rare instances, security protocols could fail, causing a breach of personal health information.  Furthermore, I acknowledge that it is my responsibility to provide information about my medical history, conditions and care that is complete and accurate to the best of my ability. I acknowledge that Practitioner's advice, recommendations, and/or decision may be based on factors not within their control, such as incomplete or inaccurate data provided by me or distortions of diagnostic images or specimens that may result from electronic transmissions. I understand that the practice of medicine is not an exact science and that Practitioner makes no warranties or guarantees regarding treatment outcomes. I acknowledge that a copy of this consent can be made available to me via my patient portal [facility] MyChart), or I can request a printed copy by calling the office of [facility].    I understand that my insurance will be billed for this visit.   I have read or had this consent read to me. I understand the contents of this consent, which adequately explains the benefits and risks of the Services being provided via telemedicine.  I have been provided ample opportunity to ask questions regarding this consent and the Services and have had my questions answered to my satisfaction. I give my informed consent for the services to be provided through the use of telemedicine in my medical care

## 2023-02-10 ENCOUNTER — Encounter: Payer: Self-pay | Admitting: Nurse Practitioner

## 2023-02-10 ENCOUNTER — Ambulatory Visit: Payer: PPO | Attending: Internal Medicine | Admitting: Nurse Practitioner

## 2023-02-10 DIAGNOSIS — Z01810 Encounter for preprocedural cardiovascular examination: Secondary | ICD-10-CM | POA: Diagnosis not present

## 2023-02-10 NOTE — Progress Notes (Signed)
Virtual Visit via Telephone Note   Because of Ryan Scott's co-morbid illnesses, he is at least at moderate risk for complications without adequate follow up.  This format is felt to be most appropriate for this patient at this time.  The patient did not have access to video technology/had technical difficulties with video requiring transitioning to audio format only (telephone).  All issues noted in this document were discussed and addressed.  No physical exam could be performed with this format.  Please refer to the patient's chart for his consent to telehealth for [facility].  Evaluation Performed:  Preoperative cardiovascular risk assessment _____________   Date:  02/10/2023   Patient ID:  Ryan Scott, DOB 1951/07/31, MRN [id number] Patient Location:  Home Provider location:   Office  Primary Care Provider:  Jackelyn Poling, DO Primary Cardiologist:  Nicki Guadalajara, MD  Chief Complaint / Patient Profile   72 y.o. y/o male with a h/o CAD s/p DES to LAD, HLD, HTN, bladder cancer who is pending right total hip replacement and presents today for telephonic preoperative cardiovascular risk assessment.  History of Present Illness    Ryan Scott is a 72 y.o. male who presents via audio/video conferencing for a telehealth visit today.  Pt was last seen in cardiology clinic on 06/28/22 by Dr. Tresa Endo.  At that time Ryan Scott was doing well.  The patient is now pending procedure as outlined above. Since his last visit, he denies chest pain, shortness of breath, lower extremity edema, fatigue, palpitations, melena, hematuria, hemoptysis, diaphoresis, weakness, presyncope, syncope, orthopnea, and PND. He walks 1 mile daily and is active doing yard work without concerning cardiac symptoms.   Past Medical History    Past Medical History:  Diagnosis Date   Arthritis    "back, neck, shoulders" (12/19/2017)   Bladder cancer (HCC)    Borderline hypertension    BPH  (benign prostatic hyperplasia)    Chronic lower back pain    ED (erectile dysfunction)    High cholesterol    "started BP RX 12/18/2017"   History of adenomatous polyp of colon    tubular adenoma   History of kidney stones    Hypertension    Hypogonadism male    Left ureteral stone    Lower urinary tract symptoms (LUTS)    RBBB (right bundle branch block)    Past Surgical History:  Procedure Laterality Date   CARDIOVASCULAR STRESS TEST  10/16/2012   normal myocardial perfusion study,  no ischemia/  not gated   COLONOSCOPY  last one 07-26-2013   CORONARY STENT INTERVENTION N/A 12/19/2017   Procedure: CORONARY STENT INTERVENTION;  Surgeon: Lennette Bihari, MD;  Location: [facility] INVASIVE CV LAB;  Service: Cardiovascular;  Laterality: N/A;   CYSTOSCOPY/RETROGRADE/URETEROSCOPY/STONE EXTRACTION WITH BASKET  1989   KNEE SURGERY Left 08/2021   LEFT HEART CATH AND CORONARY ANGIOGRAPHY N/A 12/19/2017   Procedure: LEFT HEART CATH AND CORONARY ANGIOGRAPHY;  Surgeon: Lennette Bihari, MD;  Location: [facility] INVASIVE CV LAB;  Service: Cardiovascular;  Laterality: N/A;  LAD    PROSTATE SURGERY  2002   PROSTATE SURGERY  2002   REFRACTIVE SURGERY Bilateral    SHOULDER ARTHROSCOPY WITH SUBACROMIAL DECOMPRESSION, ROTATOR CUFF REPAIR AND BICEP TENDON REPAIR Left 2013   SHOULDER SURGERY  09/2020   rotator cuff surgery   TONSILLECTOMY  ~ 1956   TRANSTHORACIC ECHOCARDIOGRAM  10/16/2012   normal LVF, ef 55-65%,  mild AV sclerosis without stenosis,  mild MR  and TR,  mild LAE,  trivial PR   TRANSURETHRAL RESECTION OF BLADDER TUMOR WITH GYRUS (TURBT-GYRUS)  1989   and URETEROSCOPIC STONE EXTRACTION    Allergies  No Known Allergies  Home Medications    Prior to Admission medications   Medication Sig Start Date End Date Taking? Authorizing Provider  allopurinol (ZYLOPRIM) 100 MG tablet Take 100 mg by mouth daily.    [provider]  aspirin 81 MG chewable tablet Chew 1 tablet (81 mg total) by mouth  daily. 12/21/17   Azalee Course, PA  atorvastatin (LIPITOR) 40 MG tablet TAKE 1 TABLET BY MOUTH EVERY DAY 08/16/22   Lennette Bihari, MD  budesonide (ENTOCORT EC) 3 MG 24 hr capsule Take 3 capsules (9 mg total) by mouth daily. Patient not taking: Reported on 11/30/2022 11/04/22   Sherrilyn Rist, MD  clopidogrel (PLAVIX) 75 MG tablet TAKE 1 TABLET BY MOUTH EVERY DAY 09/28/22   Lennette Bihari, MD  Cyanocobalamin (VITAMIN B12) 1000 MCG TBCR Take 1 tablet by mouth daily.    [provider]  dicyclomine (BENTYL) 10 MG capsule Take 1 capsule (10 mg total) by mouth 2 (two) times daily. Patient not taking: Reported on 11/30/2022 11/04/22   Sherrilyn Rist, MD  ezetimibe (ZETIA) 10 MG tablet TAKE 1 TABLET BY MOUTH EVERY DAY 08/25/22   Lennette Bihari, MD  isosorbide mononitrate (IMDUR) 30 MG 24 hr tablet TAKE 1/2 TABLET (15 MG TOTAL) BY MOUTH DAILY. 10/24/22   Joylene Grapes, NP  metoprolol tartrate (LOPRESSOR) 25 MG tablet TAKE 1/2 TABLET TWICE A DAY BY MOUTH 11/09/22   Lennette Bihari, MD  Multiple Vitamin (MULTIVITAMIN) tablet Take 1 tablet by mouth daily.    [provider]  nitroGLYCERIN (NITROSTAT) 0.4 MG SL tablet Place 1 tablet (0.4 mg total) under the tongue every 5 (five) minutes as needed for chest pain. 08/16/18 06/28/22  Lennette Bihari, MD  testosterone cypionate (DEPOTESTOTERONE CYPIONATE) 200 MG/ML injection Inject 200 mg into the muscle every 28 (twenty-eight) days.  12/04/12   [provider]    Physical Exam    Vital Signs:  Ryan Scott does not have vital signs available for review today.  Given telephonic nature of communication, physical exam is limited. AAOx3. NAD. Normal affect.  Speech and respirations are unlabored.  Accessory Clinical Findings    None  Assessment & Plan    1.  Preoperative Cardiovascular Risk Assessment: According to the Revised Cardiac Risk Index (RCRI), his Perioperative Risk of Major Cardiac Event is (%): 6.6. His Functional  Capacity in METs is: 7.59 according to the Duke Activity Status Index (DASI). The patient is doing well from a cardiac perspective. Therefore, based on ACC/AHA guidelines, the patient would be at acceptable risk for the planned procedure without further cardiovascular testing.   The patient was advised that if he develops new symptoms prior to surgery to contact our office to arrange for a follow-up visit, and he verbalized understanding.  Per office protocol, he may hold aspirin for 5-7 days and Plavix for 5 days prior to procedure. Please resume Aspirin and Plavix as soon as possible postprocedure, at the discretion of the surgeon.    A copy of this note will be routed to requesting surgeon.  Time:   Today, I have spent 10 minutes with the patient with telehealth technology discussing medical history, symptoms, and management plan.    Levi Aland, NP-C  02/10/2023, 9:04 AM [address] Office [phone] Fax [phone]

## 2023-02-15 ENCOUNTER — Encounter: Payer: Self-pay | Admitting: Gastroenterology

## 2023-02-16 MED ORDER — LINACLOTIDE 145 MCG PO CAPS: 145.0000 ug | ORAL_CAPSULE | Freq: Every day | ORAL | 0 refills | Status: DC

## 2023-02-16 MED ORDER — LINACLOTIDE 72 MCG PO CAPS: 72.0000 ug | ORAL_CAPSULE | Freq: Every day | ORAL | 0 refills | Status: DC

## 2023-02-16 NOTE — Telephone Encounter (Signed)
I do not know why he has developed worsened constipation since last year, but we have some other medicines we can try to regulate it.  MiraLAX bowel purge instructions  Please have him pick up samples of Linzess both the 72 mcg and 145 mcg doses.  (7 to 10 days of each dose)  Start at the lower dose once daily for a week.  If insufficient relief constipation, increase to 145 mcg once daily  Give [place] an update in the near future so we can work on this more if needed before his hip replacement.  HD

## 2023-03-14 ENCOUNTER — Telehealth: Payer: Self-pay | Admitting: Cardiovascular Disease

## 2023-03-14 DIAGNOSIS — I2510 Atherosclerotic heart disease of native coronary artery without angina pectoris: Secondary | ICD-10-CM

## 2023-03-14 DIAGNOSIS — I10 Essential (primary) hypertension: Secondary | ICD-10-CM

## 2023-03-14 NOTE — Telephone Encounter (Signed)
Called patient.  Dr Tresa Endo orders CMP and Lipid prior to his appointments.  These labs were ordered and patient will have done in our lab a week prior to appt.

## 2023-03-14 NOTE — Telephone Encounter (Signed)
  Per MyChart scheduling message:  Patient is asking if he needs to have any blood work done before his appointment with Dr Tresa Endo on 11/5. Please advise.

## 2023-03-26 ENCOUNTER — Encounter: Payer: Self-pay | Admitting: Cardiovascular Disease

## 2023-03-27 ENCOUNTER — Encounter: Payer: Self-pay | Admitting: Gastroenterology

## 2023-03-30 NOTE — Telephone Encounter (Signed)
Apologies for the delayed reply-have been quite busy managing inpatient consult service this week.  Started here he is still having this trouble.  It is not clear why he developed this degree of constipation starting last year, but many things we have tried have not worked well.  Linzess only seems to be causing liquid stool.  Only other medicine I can think to try is Motegrity 2 mg once daily, however it is usually not approved by Medicare. Lets at least try for prior authorization and see if we have or can get any samples for him to try it.  He should take 20 mg of Dulcolax followed by MiraLAX bowel purge 2 days prior to his planned orthopedic surgery.  -HD

## 2023-03-31 MED ORDER — MOTEGRITY 2 MG PO TABS: 2.0000 mg | ORAL_TABLET | Freq: Every day | ORAL | 3 refills | Status: DC

## 2023-03-31 NOTE — Telephone Encounter (Signed)
PA team, will someone please initiate PA for Motegrity 2 mg daily if needed. Thank you!

## 2023-04-05 ENCOUNTER — Other Ambulatory Visit (HOSPITAL_COMMUNITY): Payer: Self-pay

## 2023-04-05 NOTE — Telephone Encounter (Signed)
Good afternoon! Prior auth not required. I ran a test claim, looks like patient recently filled script on:04/03/23 Next Fill is available on:06/10/23. Filled at [facility] [phone]:  [phone]

## 2023-05-10 ENCOUNTER — Other Ambulatory Visit: Payer: Self-pay | Admitting: Cardiovascular Disease

## 2023-05-10 ENCOUNTER — Encounter: Payer: Self-pay | Admitting: Gastroenterology

## 2023-05-11 MED ORDER — NA SULFATE-K SULFATE-MG SULF 17.5-3.13-1.6 GM/177ML PO SOLN: 1.0000 | ORAL | 2 refills | Status: DC

## 2023-05-11 NOTE — Telephone Encounter (Signed)
Sorry to hear he is still struggling with constipation.  Every prescription medicine we have tried has not been helpful.  He has a markedly redundant and tortuous colon discovered at his last colonoscopy a year ago, but it is not clear whether this problem developed rather acutely last October.  The only other possible cause I can think of would be the possibility of hypothyroidism.  He follows with a primary care provider outside of this hospital system, last visit with them in June.  He should certainly be in touch with them to make sure he has had a TSH and free T4 (thyroid function labs) within the last 6 months.  If not, they should certainly check that for him.  Increase the Dulcolax to 10 mg twice daily in addition to the MiraLAX.  Also most likely needs a different sort of bowel purge to take periodically.  He definitely should do a bowel purge before his upcoming hip replacement so he does not develop a constipation crisis while recovering from that surgery.  Prescribe generic Suprep, and put 2 refills on it.  Take 1 bottle of it followed by 2 large glasses of water (over the course of about 3 hours) in the late afternoon 2 days prior to his surgery, then repeat that process the following morning (which would be the day prior to surgery).  Going forward, he could refill that prescription and use a bottle of that once a week as a purge to try instead of the MiraLAX/Gatorade.  H Danis

## 2023-06-05 ENCOUNTER — Other Ambulatory Visit: Payer: Self-pay

## 2023-06-05 ENCOUNTER — Emergency Department (HOSPITAL_BASED_OUTPATIENT_CLINIC_OR_DEPARTMENT_OTHER): Payer: PPO

## 2023-06-05 DIAGNOSIS — E669 Obesity, unspecified: Secondary | ICD-10-CM | POA: Diagnosis not present

## 2023-06-05 DIAGNOSIS — H8110 Benign paroxysmal vertigo, unspecified ear: Secondary | ICD-10-CM | POA: Diagnosis not present

## 2023-06-05 DIAGNOSIS — Z6833 Body mass index (BMI) 33.0-33.9, adult: Secondary | ICD-10-CM | POA: Insufficient documentation

## 2023-06-05 DIAGNOSIS — I2510 Atherosclerotic heart disease of native coronary artery without angina pectoris: Secondary | ICD-10-CM | POA: Diagnosis not present

## 2023-06-05 DIAGNOSIS — Z7982 Long term (current) use of aspirin: Secondary | ICD-10-CM | POA: Insufficient documentation

## 2023-06-05 DIAGNOSIS — I10 Essential (primary) hypertension: Secondary | ICD-10-CM | POA: Insufficient documentation

## 2023-06-05 DIAGNOSIS — Z79899 Other long term (current) drug therapy: Secondary | ICD-10-CM | POA: Insufficient documentation

## 2023-06-05 DIAGNOSIS — Z955 Presence of coronary angioplasty implant and graft: Secondary | ICD-10-CM | POA: Diagnosis not present

## 2023-06-05 DIAGNOSIS — Z87891 Personal history of nicotine dependence: Secondary | ICD-10-CM | POA: Diagnosis not present

## 2023-06-05 DIAGNOSIS — E871 Hypo-osmolality and hyponatremia: Secondary | ICD-10-CM | POA: Insufficient documentation

## 2023-06-05 DIAGNOSIS — Z8551 Personal history of malignant neoplasm of bladder: Secondary | ICD-10-CM | POA: Diagnosis not present

## 2023-06-05 DIAGNOSIS — Z7902 Long term (current) use of antithrombotics/antiplatelets: Secondary | ICD-10-CM | POA: Diagnosis not present

## 2023-06-05 DIAGNOSIS — R42 Dizziness and giddiness: Secondary | ICD-10-CM | POA: Diagnosis present

## 2023-06-05 LAB — CBC
HCT: 46.7 % (ref 39.0–52.0)
Hemoglobin: 15.5 g/dL (ref 13.0–17.0)
MCH: 29.1 pg (ref 26.0–34.0)
MCHC: 33.2 g/dL (ref 30.0–36.0)
MCV: 87.8 fL (ref 80.0–100.0)
Platelets: 304 10*3/uL (ref 150–400)
RBC: 5.32 MIL/uL (ref 4.22–5.81)
RDW: 13.7 % (ref 11.5–15.5)
WBC: 9.9 10*3/uL (ref 4.0–10.5)
nRBC: 0 % (ref 0.0–0.2)

## 2023-06-05 LAB — BASIC METABOLIC PANEL
Anion gap: 11 (ref 5–15)
BUN: 15 mg/dL (ref 8–23)
CO2: 21 mmol/L — ABNORMAL LOW (ref 22–32)
Calcium: 8.9 mg/dL (ref 8.9–10.3)
Chloride: 102 mmol/L (ref 98–111)
Creatinine, Ser: 0.88 mg/dL (ref 0.61–1.24)
GFR, Estimated: >60 mL/min (ref >60)
Glucose, Bld: 122 mg/dL — ABNORMAL HIGH (ref 70–99)
Potassium: 3.8 mmol/L (ref 3.5–5.1)
Sodium: 134 mmol/L — ABNORMAL LOW (ref 135–145)

## 2023-06-05 MED ORDER — ONDANSETRON 4 MG PO TBDP
4.0000 mg | ORAL_TABLET | Freq: Once | ORAL | Status: AC | PRN
  Administered 2023-06-05: 4 mg via ORAL
  Filled 2023-06-05: qty 1

## 2023-06-05 NOTE — ED Triage Notes (Signed)
Dizziness, emesis since 1000 today. Feels like room is spinning. -CP, -SOB. No focal neuro deficits appreciated in triage.   Right hip rep 3 weeks ago- same leg appears swollen- pt and family report physical therapy has not been concerned about this and is 'normal' part of healing process- swelling has been imporving since surg. Pulses intact bilateral lower extremities.   HX cardiac stent-2019. Hx vertigo 2 years ago.

## 2023-06-06 ENCOUNTER — Other Ambulatory Visit: Payer: Self-pay

## 2023-06-06 ENCOUNTER — Encounter (HOSPITAL_COMMUNITY): Payer: Self-pay | Admitting: Internal Medicine

## 2023-06-06 ENCOUNTER — Observation Stay (HOSPITAL_BASED_OUTPATIENT_CLINIC_OR_DEPARTMENT_OTHER)
Admission: EM | Admit: 2023-06-06 | Discharge: 2023-06-07 | Disposition: A | Discharge status: Home / Self Care | Payer: PPO | Attending: Internal Medicine | Admitting: Internal Medicine

## 2023-06-06 ENCOUNTER — Emergency Department (HOSPITAL_COMMUNITY): Payer: PPO

## 2023-06-06 DIAGNOSIS — I10 Essential (primary) hypertension: Secondary | ICD-10-CM | POA: Diagnosis not present

## 2023-06-06 DIAGNOSIS — E871 Hypo-osmolality and hyponatremia: Secondary | ICD-10-CM | POA: Diagnosis present

## 2023-06-06 DIAGNOSIS — H8110 Benign paroxysmal vertigo, unspecified ear: Secondary | ICD-10-CM | POA: Diagnosis present

## 2023-06-06 DIAGNOSIS — R42 Dizziness and giddiness: Principal | ICD-10-CM

## 2023-06-06 DIAGNOSIS — E669 Obesity, unspecified: Secondary | ICD-10-CM | POA: Diagnosis present

## 2023-06-06 DIAGNOSIS — I2510 Atherosclerotic heart disease of native coronary artery without angina pectoris: Secondary | ICD-10-CM

## 2023-06-06 DIAGNOSIS — E785 Hyperlipidemia, unspecified: Secondary | ICD-10-CM | POA: Diagnosis present

## 2023-06-06 DIAGNOSIS — E782 Mixed hyperlipidemia: Secondary | ICD-10-CM

## 2023-06-06 DIAGNOSIS — R270 Ataxia, unspecified: Secondary | ICD-10-CM

## 2023-06-06 DIAGNOSIS — Z9861 Coronary angioplasty status: Secondary | ICD-10-CM

## 2023-06-06 LAB — MAGNESIUM: Magnesium: 1.9 mg/dL (ref 1.7–2.4)

## 2023-06-06 LAB — TROPONIN I (HIGH SENSITIVITY): Troponin I (High Sensitivity): 5 ng/L (ref <18)

## 2023-06-06 LAB — TSH: TSH: 1.238 u[IU]/mL (ref 0.350–4.500)

## 2023-06-06 MED ORDER — LACTATED RINGERS IV BOLUS
1000.0000 mL | Freq: Once | INTRAVENOUS | Status: AC
  Administered 2023-06-06: 1000 mL via INTRAVENOUS

## 2023-06-06 MED ORDER — ASPIRIN 81 MG PO CHEW
81.0000 mg | CHEWABLE_TABLET | Freq: Every day | ORAL | Status: DC
  Administered 2023-06-06 – 2023-06-07 (×2): 81 mg via ORAL
  Filled 2023-06-06 (×2): qty 1

## 2023-06-06 MED ORDER — ACETAMINOPHEN 325 MG PO TABS: 650.0000 mg | ORAL_TABLET | Freq: Four times a day (QID) | ORAL | Status: DC | PRN

## 2023-06-06 MED ORDER — PNEUMOCOCCAL 20-VAL CONJ VACC 0.5 ML IM SUSY
0.5000 mL | PREFILLED_SYRINGE | INTRAMUSCULAR | Status: DC
  Filled 2023-06-06: qty 0.5

## 2023-06-06 MED ORDER — CLOPIDOGREL BISULFATE 75 MG PO TABS
75.0000 mg | ORAL_TABLET | Freq: Every day | ORAL | Status: DC
  Administered 2023-06-06 – 2023-06-07 (×2): 75 mg via ORAL
  Filled 2023-06-06 (×2): qty 1

## 2023-06-06 MED ORDER — ALLOPURINOL 100 MG PO TABS
100.0000 mg | ORAL_TABLET | Freq: Every day | ORAL | Status: DC
  Administered 2023-06-06 – 2023-06-07 (×2): 100 mg via ORAL
  Filled 2023-06-06 (×2): qty 1

## 2023-06-06 MED ORDER — POLYETHYLENE GLYCOL 3350 17 G PO PACK
34.0000 g | PACK | Freq: Every day | ORAL | Status: DC
  Administered 2023-06-06 – 2023-06-07 (×2): 34 g via ORAL
  Filled 2023-06-06 (×2): qty 2

## 2023-06-06 MED ORDER — SODIUM CHLORIDE 0.9 % IV BOLUS
500.0000 mL | Freq: Once | INTRAVENOUS | Status: AC
  Administered 2023-06-06: 500 mL via INTRAVENOUS

## 2023-06-06 MED ORDER — ATORVASTATIN CALCIUM 40 MG PO TABS
40.0000 mg | ORAL_TABLET | Freq: Every day | ORAL | Status: DC
  Administered 2023-06-06 – 2023-06-07 (×2): 40 mg via ORAL
  Filled 2023-06-06 (×2): qty 1

## 2023-06-06 MED ORDER — LORAZEPAM 2 MG/ML IJ SOLN
1.0000 mg | Freq: Once | INTRAMUSCULAR | Status: AC
  Administered 2023-06-06: 1 mg via INTRAVENOUS
  Filled 2023-06-06: qty 1

## 2023-06-06 MED ORDER — MIDAZOLAM HCL 2 MG/2ML IJ SOLN: 1.0000 mg | Freq: Once | INTRAMUSCULAR | Status: DC

## 2023-06-06 MED ORDER — BISACODYL 5 MG PO TBEC: 5.0000 mg | DELAYED_RELEASE_TABLET | Freq: Every day | ORAL | Status: DC | PRN

## 2023-06-06 MED ORDER — METOCLOPRAMIDE HCL 5 MG/ML IJ SOLN
10.0000 mg | INTRAMUSCULAR | Status: AC
  Administered 2023-06-06: 10 mg via INTRAVENOUS
  Filled 2023-06-06: qty 2

## 2023-06-06 MED ORDER — SODIUM CHLORIDE 0.9% FLUSH
3.0000 mL | Freq: Two times a day (BID) | INTRAVENOUS | Status: DC
  Administered 2023-06-06 (×2): 3 mL via INTRAVENOUS

## 2023-06-06 MED ORDER — OXYCODONE HCL 5 MG PO TABS: 5.0000 mg | ORAL_TABLET | ORAL | Status: DC | PRN

## 2023-06-06 MED ORDER — MIDAZOLAM HCL 2 MG/2ML IJ SOLN
2.0000 mg | Freq: Once | INTRAMUSCULAR | Status: AC
  Administered 2023-06-06: 2 mg via INTRAVENOUS

## 2023-06-06 MED ORDER — ACETAMINOPHEN 650 MG RE SUPP: 650.0000 mg | Freq: Four times a day (QID) | RECTAL | Status: DC | PRN

## 2023-06-06 MED ORDER — ALBUTEROL SULFATE (2.5 MG/3ML) 0.083% IN NEBU: 2.5000 mg | INHALATION_SOLUTION | Freq: Four times a day (QID) | RESPIRATORY_TRACT | Status: DC | PRN

## 2023-06-06 MED ORDER — MECLIZINE HCL 25 MG PO TABS: 25.0000 mg | ORAL_TABLET | Freq: Two times a day (BID) | ORAL | Status: DC | PRN

## 2023-06-06 MED ORDER — ENOXAPARIN SODIUM 40 MG/0.4ML IJ SOSY
40.0000 mg | PREFILLED_SYRINGE | INTRAMUSCULAR | Status: DC
  Administered 2023-06-06: 40 mg via SUBCUTANEOUS
  Filled 2023-06-06: qty 0.4

## 2023-06-06 MED ORDER — ISOSORBIDE MONONITRATE ER 30 MG PO TB24
30.0000 mg | ORAL_TABLET | Freq: Every day | ORAL | Status: DC
  Administered 2023-06-06 – 2023-06-07 (×2): 30 mg via ORAL
  Filled 2023-06-06 (×2): qty 1

## 2023-06-06 MED ORDER — EZETIMIBE 10 MG PO TABS
10.0000 mg | ORAL_TABLET | Freq: Every day | ORAL | Status: DC
  Administered 2023-06-06 – 2023-06-07 (×2): 10 mg via ORAL
  Filled 2023-06-06 (×2): qty 1

## 2023-06-06 MED ORDER — MIDAZOLAM HCL 2 MG/2ML IJ SOLN
5.0000 mg | Freq: Once | INTRAMUSCULAR | Status: DC
  Filled 2023-06-06: qty 6

## 2023-06-06 MED ORDER — METOPROLOL TARTRATE 12.5 MG HALF TABLET
12.5000 mg | ORAL_TABLET | Freq: Two times a day (BID) | ORAL | Status: DC
  Administered 2023-06-06 – 2023-06-07 (×3): 12.5 mg via ORAL
  Filled 2023-06-06 (×3): qty 1

## 2023-06-06 NOTE — ED Provider Notes (Signed)
Patient was seen previously by Dr. Orland Dec and Dr. Rodena Medin.  Plan was to follow-up on the patient's laboratory test.  Labs are unremarkable.  MRI previously performed did not show any acute stroke  10:24 AM patient attempted to ambulate but still feels very unsteady.  He has been in the ED now for approximately 14 hours has received several treatments but still does not feel like he can safely ambulate at home.  I will consult the medical service for admission  Case discussed with Dr. Christeen Douglas, Cletis Athens, MD 06/06/23 1039

## 2023-06-06 NOTE — Plan of Care (Signed)
  Problem: Education: Goal: Knowledge of General Education information will improve Description Including pain rating scale, medication(s)/side effects and non-pharmacologic comfort measures Outcome: Progressing   

## 2023-06-06 NOTE — Progress Notes (Signed)
New Admission Note:   Arrival Method: Stretcher  Mental Orientation: Alert and oriented  Telemetry: Assessment: Completed Skin: intact dry swollen in right foot  IV: Right forearm  Pain: 0/10  Tubes: None  Safety Measures: Safety Fall Prevention Plan has been given, discussed and signed Admission: Completed Orientation: Patient has been orientated to the room, unit and staff.  Family: None   Orders have been reviewed and implemented. Will continue to monitor the patient. Call light has been placed within reach and bed alarm has been activated.   Norman Clay RN 61M Phone: [phone]

## 2023-06-06 NOTE — Progress Notes (Signed)
Physical Therapy Treatment Patient Details Name: Ryan Scott MRN: [id number] DOB: 04-11-1951 Today's Date: 06/06/2023   History of Present Illness Ryan Scott is a 72 y.o. year-old male presenting to the ED 9/10 with chief complaint of dizziness and vomiting.  Dx vertigo.  PMH: CAD, right hip replacement 3 weeks ago    PT Comments  Patient demonstrating symptoms consistent R unilateral vestibular hypofunction.  Initiated x1 gaze stabilization and assisted for mobility in hallway with RW using compensatory strategies.  Patient needs further testing to r/o and continue to treat BPPV.  PT will continue to follow.  Will need outpatient vestibular rehab.  Currently at [facility] for his hip rehab.    If plan is discharge home, recommend the following: A little help with walking and/or transfers;Assistance with cooking/housework;Assist for transportation   Can travel by private vehicle        Equipment Recommendations  None recommended by PT    Recommendations for Other Services       Precautions / Restrictions Precautions Precautions: Fall     Mobility  Bed Mobility Overal bed mobility: Needs Assistance Bed Mobility: Supine to Sit, Sit to Supine     Supine to sit: Supervision, Used rails, HOB elevated Sit to supine: Supervision        Transfers Overall transfer level: Needs assistance Equipment used: Rolling walker (2 wheels) Transfers: Sit to/from Stand Sit to Stand: Contact guard assist           General transfer comment: for balance    Ambulation/Gait Ambulation/Gait assistance: Contact guard assist Gait Distance (Feet): 90 Feet Assistive device: Rolling walker (2 wheels) Gait Pattern/deviations: Step-through pattern, Decreased stride length, Drifts right/left, Wide base of support       General Gait Details: cues for slow turns with visual targets, pt reports feels as if veering L   Stairs             Wheelchair Mobility     Tilt  Bed    Modified Rankin (Stroke Patients Only)       Balance Overall balance assessment: Needs assistance   Sitting balance-Leahy Scale: Good     Standing balance support: Reliant on assistive device for balance, Bilateral upper extremity supported Standing balance-Leahy Scale: Poor                              Cognition Arousal: Alert Behavior During Therapy: WFL for tasks assessed/performed Overall Cognitive Status: Within Functional Limits for tasks assessed                                          Exercises Other Exercises Other Exercises: Performed seated gaze stabilization x1 exercises with near target fixed to table x 50 sec horizontal head movements, then vertical after seated rest, provokes dizziness 4-5/10; issued to HEP    General Comments General comments (skin integrity, edema, etc.): Seated oculomotor assessment positive for L beat nystagmus following Alexander's Law more when looking L.  Positive head thrust test to R with refixation saccade.  Noted smooth pursuits and saccades WNL.  No changes in hearing and pt denies recent URI or allergy symptoms.      Pertinent Vitals/Pain Pain Assessment Faces Pain Scale: Hurts little more Pain Location: right hip Pain Descriptors / Indicators: Discomfort, Grimacing, Guarding Pain Intervention(s): Monitored during session, Ice  applied    Home Living                          Prior Function            PT Goals (current goals can now be found in the care plan section) Progress towards PT goals: Progressing toward goals    Frequency    Min 1X/week      PT Plan      Co-evaluation              AM-PAC PT "6 Clicks" Mobility   Outcome Measure  Help needed turning from your back to your side while in a flat bed without using bedrails?: None Help needed moving from lying on your back to sitting on the side of a flat bed without using bedrails?: A Little Help needed  moving to and from a bed to a chair (including a wheelchair)?: A Little Help needed standing up from a chair using your arms (e.g., wheelchair or bedside chair)?: A Little Help needed to walk in hospital room?: A Little Help needed climbing 3-5 steps with a railing? : Total 6 Click Score: 17    End of Session Equipment Utilized During Treatment: Gait belt Activity Tolerance: Patient tolerated treatment well Patient left: in bed   PT Visit Diagnosis: Unsteadiness on feet (R26.81);Dizziness and giddiness (R42);BPPV     Time: 2355-7322 PT Time Calculation (min) (ACUTE ONLY): 34 min  Charges:    $Neuromuscular Re-education: 23-37 mins PT General Charges $$ ACUTE PT VISIT: 1 Visit                     Sheran Lawless, PT Acute Rehabilitation Services Office:[phone] 06/06/2023    Elray Mcgregor 06/06/2023, 6:24 PM

## 2023-06-06 NOTE — ED Provider Notes (Signed)
[facility] Emergency Department Provider Note MRN:  [id number]  Arrival date & time: 06/06/23     Chief Complaint   Dizziness and Emesis   History of Present Illness   Ryan Scott is a 72 y.o. year-old male with a history of CAD presenting to the ED with chief complaint of dizziness and vomiting.  Room spinning vertigo sensation since 10 AM, fairly constant since then.  Room spinning whenever he has his eyes open.  Does not really seem changed with head movement.  Has not been able to walk on his own since 3 or 4 PM due to the dizziness, feels very unsteady on his feet.  Denies numbness or weakness to the arms or legs, no headache, no trouble with speech or vision.  Review of Systems  A thorough review of systems was obtained and all systems are negative except as noted in the HPI and PMH.   Patient's Health History    Past Medical History:  Diagnosis Date   Arthritis    "back, neck, shoulders" (12/19/2017)   Bladder cancer (HCC)    Borderline hypertension    BPH (benign prostatic hyperplasia)    Chronic lower back pain    ED (erectile dysfunction)    High cholesterol    "started BP RX 12/18/2017"   History of adenomatous polyp of colon    tubular adenoma   History of kidney stones    Hypertension    Hypogonadism male    Left ureteral stone    Lower urinary tract symptoms (LUTS)    RBBB (right bundle branch block)     Past Surgical History:  Procedure Laterality Date   CARDIOVASCULAR STRESS TEST  10/16/2012   normal myocardial perfusion study,  no ischemia/  not gated   COLONOSCOPY  last one 07-26-2013   CORONARY STENT INTERVENTION N/A 12/19/2017   Procedure: CORONARY STENT INTERVENTION;  Surgeon: Lennette Bihari, MD;  Location: [facility];  Service: Cardiovascular;  Laterality: N/A;   CYSTOSCOPY/RETROGRADE/URETEROSCOPY/STONE EXTRACTION WITH BASKET  1989   KNEE SURGERY Left 08/2021   LEFT HEART CATH AND CORONARY ANGIOGRAPHY N/A  12/19/2017   Procedure: LEFT HEART CATH AND CORONARY ANGIOGRAPHY;  Surgeon: Lennette Bihari, MD;  Location: [facility]VASIVE [facility];  Service: Cardiovascular;  Laterality: N/A;  LAD    PROSTATE SURGERY  2002   PROSTATE SURGERY  2002   REFRACTIVE SURGERY Bilateral    SHOULDER ARTHROSCOPY WITH SUBACROMIAL DECOMPRESSION, ROTATOR CUFF REPAIR AND BICEP TENDON REPAIR Left 2013   SHOULDER SURGERY  09/2020   rotator cuff surgery   TONSILLECTOMY  ~ 1956   TRANSTHORACIC ECHOCARDIOGRAM  10/16/2012   normal LVF, ef 55-65%,  mild AV sclerosis without stenosis,  mild MR and TR,  mild LAE,  trivial PR   TRANSURETHRAL RESECTION OF BLADDER TUMOR WITH GYRUS (TURBT-GYRUS)  1989   and URETEROSCOPIC STONE EXTRACTION    Family History  Problem Relation Age of Onset   Breast cancer Mother    Thyroid disease Mother    Heart attack Father    Heart disease Father    Arthritis Father    Hypertension Father    Arthritis Sister    Stroke Maternal Grandmother    Lung cancer Maternal Grandfather    Other Paternal Grandmother        brain tumor   Aneurysm Paternal Grandfather    Colon cancer Neg Hx    Esophageal cancer Neg Hx    Rectal cancer Neg Hx  Stomach cancer Neg Hx    Liver cancer Neg Hx    Pancreatic cancer Neg Hx     Social History   Socioeconomic History   Marital status: Married    Spouse name: Not on file   Number of children: Not on file   Years of education: Not on file   Highest education level: Not on file  Occupational History   Not on file  Tobacco Use   Smoking status: Never   Smokeless tobacco: Former    Types: Chew    Quit date: 1991  Vaping Use   Vaping status: Never Used  Substance and Sexual Activity   Alcohol use: Yes    Alcohol/week: 2.0 standard drinks of alcohol    Types: 2 Standard drinks or equivalent per week   Drug use: Not Currently   Sexual activity: Not on file  Other Topics Concern   Not on file  Social History Narrative   Not on file   Social  Determinants of Health   Financial Resource Strain: Not on file  Food Insecurity: Not on file  Transportation Needs: Not on file  Physical Activity: Not on file  Stress: Not on file  Social Connections: Not on file  Intimate Partner Violence: Not on file     Physical Exam   Vitals:   06/06/23 0030 06/06/23 0045  BP: (!) 130/95 137/84  Pulse: 76 76  Resp:    Temp:    SpO2: 93% 90%    CONSTITUTIONAL: Well-appearing, NAD NEURO/PSYCH:  Alert and oriented x 3, normal and symmetric strength and sensation, normal coordination, normal speech EYES:  eyes equal and reactive ENT/NECK:  no LAD, no JVD CARDIO: Regular rate, well-perfused, normal S1 and S2 PULM:  CTAB no wheezing or rhonchi GI/GU:  non-distended, non-tender MSK/SPINE:  No gross deformities, no edema SKIN:  no rash, atraumatic   *Additional and/or pertinent findings included in MDM below  Diagnostic and Interventional Summary    EKG Interpretation Date/Time:  Monday June 05 2023 21:03:55 EDT Ventricular Rate:  85 PR Interval:  194 QRS Duration:  116 QT Interval:  366 QTC Calculation: 435 R Axis:   -23  Text Interpretation: Undetermined rhythm Right bundle branch block Anterior infarct , age undetermined Abnormal ECG When compared with ECG of 20-Dec-2017 03:17, Current undetermined rhythm precludes rhythm comparison, needs review Right bundle branch block has replaced Incomplete right bundle branch block Anterior infarct is now Present Artifact Confirmed by Estanislado Pandy [id number]) on 06/05/2023 9:24:23 PM       Labs Reviewed  BASIC METABOLIC PANEL - Abnormal; Notable for the following components:      Result Value   Sodium 134 (*)    CO2 21 (*)    Glucose, Bld 122 (*)    All other components within normal limits  CBC    CT HEAD WO CONTRAST ( )  Final Result    MR BRAIN WO CONTRAST    (Results Pending)    Medications  ondansetron (ZOFRAN-ODT) disintegrating tablet 4 mg (4 mg Oral Given 06/05/23 2043)   LORazepam (ATIVAN) injection 1 mg (1 mg Intravenous Given 06/06/23 0121)  sodium chloride 0.9 % bolus 500 mL (500 mLs Intravenous New Bag/Given 06/06/23 0121)     Procedures  /  Critical Care Procedures  ED Course and Medical Decision Making  Initial Impression and Ddx Question peripheral versus central vertigo.  He has had an episode of vertigo 2 years ago that did not last this long.  A bit  concerning that today's episode is constant and lasting as long as it has, not really positional.  Patient also has cardiovascular risk factors.  Will need MRI to exclude cerebellar stroke.  Patient also may need admission if he is unable to feel better and ambulate on his own.  Past medical/surgical history that increases complexity of ED encounter: History of CAD with stent  Interpretation of Diagnostics I personally reviewed the EKG and my interpretation is as follows: Sinus rhythm with nonspecific intraventricular conduction delay  Labs overall reassuring with no significant blood count or electrolyte disturbance, CT without acute process  Patient Reassessment and Ultimate Disposition/Management     Accepted for transfer to [facility] emergency department for MRI, Dr. Wilkie Aye the accepting EDP.  Patient management required discussion with the following services or consulting groups:  None  Complexity of Problems Addressed Acute illness or injury that poses threat of life of bodily function  Additional Data Reviewed and Analyzed Further history obtained from: Further history from spouse/family member  Additional Factors Impacting ED Encounter Risk Consideration of hospitalization  Elmer Sow. Pilar Plate, MD [facility] Emergency Medicine [facility] [email or web address]  Final Clinical Impressions(s) / ED Diagnoses     ICD-10-CM   1. Vertigo  R42       ED Discharge Orders     None        Discharge Instructions Discussed with and Provided to Patient:    Discharge  Instructions      We are transferring you to the [facility] emergency department for further testing, specifically an MRI of your brain.      Sabas Sous, MD 06/06/23 [phone]

## 2023-06-06 NOTE — Evaluation (Signed)
Physical Therapy Evaluation Patient Details Name: Ryan Scott MRN: [id number] DOB: 21-Feb-1951 Today's Date: 06/06/2023  History of Present Illness  Ryan Scott is a 72 y.o. year-old male presenting to the ED 9/10 with chief complaint of dizziness and vomiting.  Dx vertigo.  PMH: CAD, right hip replacement 3 weeks ago  Clinical Impression  Pt admitted with above diagnosis. Pt with vertigo with nystagmus beating left rotary and left horizontally at rest.  Assessment difficult as appears crystals may be lodged between canals or in more than one canal.  Treated via Epley maneuver and will let the crystals settle and return for a second treatment in pm.  Will ask another therapist as this PT is off this pm.  Did message MD that pt may need Meclizine to assist with symptoms short term and MD acknowledged.  Will continue treatment and progress pt as able.   Pt currently with functional limitations due to the deficits listed below (see PT Problem List). Pt will benefit from acute skilled PT to increase their independence and safety with mobility to allow discharge.           If plan is discharge home, recommend the following: A little help with walking and/or transfers;Assistance with cooking/housework;Assist for transportation   Can travel by private vehicle        Equipment Recommendations None recommended by PT  Recommendations for Other Services       Functional Status Assessment Patient has had a recent decline in their functional status and demonstrates the ability to make significant improvements in function in a reasonable and predictable amount of time.     Precautions / Restrictions Precautions Precautions: Fall Restrictions Weight Bearing Restrictions: No      Mobility  Bed Mobility Overal bed mobility: Needs Assistance Bed Mobility: Rolling, Sidelying to Sit Rolling: Contact guard assist Sidelying to sit: Contact guard assist            Transfers Overall  transfer level: Needs assistance Equipment used: None Transfers: Sit to/from Stand Sit to Stand: Contact guard assist           General transfer comment: Stood to take a few side steps to [facility] after performing Epley maneuver.  Pt unsure on feet and needing steadying    Ambulation/Gait                  Stairs            Wheelchair Mobility     Tilt Bed    Modified Rankin (Stroke Patients Only)       Balance                                             Pertinent Vitals/Pain Pain Assessment Pain Assessment: Faces Faces Pain Scale: Hurts little more Pain Location: right hip Pain Descriptors / Indicators: Discomfort, Grimacing, Guarding Pain Intervention(s): Limited activity within patient's tolerance, Monitored during session, Repositioned    Home Living Family/patient expects to be discharged to:: Private residence Living Arrangements: Spouse/significant other Available Help at Discharge: Family;Available 24 hours/day Type of Home: House Home Access: Stairs to enter Entrance Stairs-Rails: Doctor, general practice of Steps: 6   Home Layout: One level Home Equipment: Agricultural consultant (2 wheels);Gilmer Mor - single point      Prior Function  Mobility Comments: used RW after hip surgery and has progressed to cane ADLs Comments: I B/D     Extremity/Trunk Assessment   Upper Extremity Assessment Upper Extremity Assessment: Defer to OT evaluation    Lower Extremity Assessment Lower Extremity Assessment: Overall WFL for tasks assessed    Cervical / Trunk Assessment Cervical / Trunk Assessment: Normal  Communication   Communication Communication: No apparent difficulties  Cognition Arousal: Alert Behavior During Therapy: WFL for tasks assessed/performed Overall Cognitive Status: Within Functional Limits for tasks assessed                                          General Comments General  comments (skin integrity, edema, etc.): Pt has nystagmus at rest that appeared to be left rotary and left horizontal.  Suspicious that the crystals may be stuck between left posterior and left horizontal canals.Did perform an Epley maneuver and unsure if it was effective as it usually takes an hour to settle.    Exercises     Assessment/Plan    PT Assessment Patient needs continued PT services  PT Problem List Decreased activity tolerance;Decreased balance;Decreased mobility;Decreased knowledge of use of DME;Decreased safety awareness;Decreased knowledge of precautions       PT Treatment Interventions DME instruction;Gait training;Stair training;Functional mobility training;Therapeutic activities;Therapeutic exercise;Balance training;Patient/family education (Epley maneuver)    PT Goals (Current goals can be found in the Care Plan section)  Acute Rehab PT Goals Patient Stated Goal: to go home PT Goal Formulation: With patient Time For Goal Achievement: 06/20/23 Potential to Achieve Goals: Good    Frequency Min 1X/week     Co-evaluation               AM-PAC PT "6 Clicks" Mobility  Outcome Measure Help needed turning from your back to your side while in a flat bed without using bedrails?: A Little Help needed moving from lying on your back to sitting on the side of a flat bed without using bedrails?: A Little Help needed moving to and from a bed to a chair (including a wheelchair)?: A Little Help needed standing up from a chair using your arms (e.g., wheelchair or bedside chair)?: A Little Help needed to walk in hospital room?: A Little Help needed climbing 3-5 steps with a railing? : A Little 6 Click Score: 18    End of Session   Activity Tolerance:  (limited by dizziness) Patient left: with call bell/phone within reach (on stretcher) Nurse Communication: Mobility status PT Visit Diagnosis: Unsteadiness on feet (R26.81);Dizziness and giddiness (R42);BPPV BPPV -  Right/Left : Left    Time: 1610-9604 PT Time Calculation (min) (ACUTE ONLY): 25 min   Charges:   PT Evaluation $PT Eval Moderate Complexity: 1 Mod PT Treatments $Canalith Rep Proc: 8-22 mins PT General Charges $$ ACUTE PT VISIT: 1 Visit         Brynlyn Dade M,PT Acute Rehab Services [phone]   Bevelyn Buckles 06/06/2023, 12:24 PM

## 2023-06-06 NOTE — H&P (Addendum)
History and Physical    Patient: Ryan Scott ZOX:[id number] DOB: 08/25/1951 DOA: 06/06/2023 DOS: the patient was seen and examined on 06/06/2023 PCP: Jackelyn Poling, DO  Patient coming from: Home  Chief Complaint:  Chief Complaint  Patient presents with   Dizziness   Emesis   HPI: JOMAR WHITSETT is a 72 y.o. male with medical history significant of hypertension, hyperlipidemia, CAD s/p DES to LA, nephrolithiasis, and bladder cancer who presents with complaints of dizziness.  Symptoms started while he was sitting on the couch watching TV at 10 AM yesterday morning.  He reported that the whole room was spinning around him like he was on a merry-go-round.  Symptoms lasted 1 to 2 minutes and seem to improve shortly for him to get up but then resumed and seem like the room was spinning the other way.  He reported getting nauseous and having an episodes of vomiting.  He went and lay down and took a nap.  However, symptoms returned and constant for which he came to the hospital for further evaluation.  He reports chronically having right leg swelling since he has had total hip replacement.  He had not had any significant fever, focal weakness, change in speech, change in vision, chest pain, cough, shortness of breath, palpitations, abdominal pain, or dysuria symptoms.  In the emergency room patient was noted to be afebrile with stable vital signs.  He had been placed on 2 L of nasal cannula oxygen 93%.  Labs noted sodium 134, BUN 15, creatinine 0.88, and glucose 1.22.  CT scan of the head noted generalized cerebral atrophy without acute abnormality.  MRI of the brain was negative for any acute abnormality.  Patient having given a total of 1.5 L of IV fluids, Reglan, Ativan, and Zofran.  Despite this patient for which [facility] called to admit  Review of Systems: As mentioned in the history of present illness. All other systems reviewed and are negative. Past Medical History:  Diagnosis Date   Arthritis     "back, neck, shoulders" (12/19/2017)   Bladder cancer (HCC)    Borderline hypertension    BPH (benign prostatic hyperplasia)    Chronic lower back pain    ED (erectile dysfunction)    High cholesterol    "started BP RX 12/18/2017"   History of adenomatous polyp of colon    tubular adenoma   History of kidney stones    Hypertension    Hypogonadism male    Left ureteral stone    Lower urinary tract symptoms (LUTS)    RBBB (right bundle branch block)    Past Surgical History:  Procedure Laterality Date   CARDIOVASCULAR STRESS TEST  10/16/2012   normal myocardial perfusion study,  no ischemia/  not gated   COLONOSCOPY  last one 07-26-2013   CORONARY STENT INTERVENTION N/A 12/19/2017   Procedure: CORONARY STENT INTERVENTION;  Surgeon: Lennette Bihari, MD;  Location: [facility];  Service: Cardiovascular;  Laterality: N/A;   CYSTOSCOPY/RETROGRADE/URETEROSCOPY/STONE EXTRACTION WITH BASKET  1989   KNEE SURGERY Left 08/2021   LEFT HEART CATH AND CORONARY ANGIOGRAPHY N/A 12/19/2017   Procedure: LEFT HEART CATH AND CORONARY ANGIOGRAPHY;  Surgeon: Lennette Bihari, MD;  Location: [facility];  Service: Cardiovascular;  Laterality: N/A;  LAD    PROSTATE SURGERY  2002   PROSTATE SURGERY  2002   REFRACTIVE SURGERY Bilateral    SHOULDER ARTHROSCOPY WITH SUBACROMIAL DECOMPRESSION, ROTATOR CUFF REPAIR AND BICEP TENDON REPAIR Left 2013  SHOULDER SURGERY  09/2020   rotator cuff surgery   TONSILLECTOMY  ~ 1956   TRANSTHORACIC ECHOCARDIOGRAM  10/16/2012   normal LVF, ef 55-65%,  mild AV sclerosis without stenosis,  mild MR and TR,  mild LAE,  trivial PR   TRANSURETHRAL RESECTION OF BLADDER TUMOR WITH GYRUS (TURBT-GYRUS)  1989   and URETEROSCOPIC STONE EXTRACTION   Social History:  reports that he has never smoked. He quit smokeless tobacco use about 33 years ago.  His smokeless tobacco use included chew. He reports current alcohol use of about 2.0 standard drinks of alcohol per  week. He reports that he does not currently use drugs.  No Known Allergies  Family History  Problem Relation Age of Onset   Breast cancer Mother    Thyroid disease Mother    Heart attack Father    Heart disease Father    Arthritis Father    Hypertension Father    Arthritis Sister    Stroke Maternal Grandmother    Lung cancer Maternal Grandfather    Other Paternal Grandmother        brain tumor   Aneurysm Paternal Grandfather    Colon cancer Neg Hx    Esophageal cancer Neg Hx    Rectal cancer Neg Hx    Stomach cancer Neg Hx    Liver cancer Neg Hx    Pancreatic cancer Neg Hx     Prior to Admission medications   Medication Sig Start Date End Date Taking? Authorizing Provider  allopurinol (ZYLOPRIM) 100 MG tablet Take 100 mg by mouth daily.    [provider]  aspirin 81 MG chewable tablet Chew 1 tablet (81 mg total) by mouth daily. 12/21/17   Azalee Course, PA  atorvastatin (LIPITOR) 40 MG tablet TAKE 1 TABLET BY MOUTH EVERY DAY 08/16/22   Lennette Bihari, MD  budesonide (ENTOCORT EC) 3 MG 24 hr capsule Take 3 capsules (9 mg total) by mouth daily. Patient not taking: Reported on 11/30/2022 11/04/22   Sherrilyn Rist, MD  clopidogrel (PLAVIX) 75 MG tablet TAKE 1 TABLET BY MOUTH EVERY DAY 09/28/22   Lennette Bihari, MD  Cyanocobalamin (VITAMIN B12) 1000 MCG TBCR Take 1 tablet by mouth daily.    [provider]  dicyclomine (BENTYL) 10 MG capsule Take 1 capsule (10 mg total) by mouth 2 (two) times daily. Patient not taking: Reported on 11/30/2022 11/04/22   Sherrilyn Rist, MD  ezetimibe (ZETIA) 10 MG tablet TAKE 1 TABLET BY MOUTH EVERY DAY 08/25/22   Lennette Bihari, MD  isosorbide mononitrate (IMDUR) 30 MG 24 hr tablet TAKE 1/2 TABLET (15 MG TOTAL) BY MOUTH DAILY. 10/24/22   Joylene Grapes, NP  metoprolol tartrate (LOPRESSOR) 25 MG tablet TAKE 1/2 TABLET TWICE A DAY BY MOUTH 05/10/23   Lennette Bihari, MD  Multiple Vitamin (MULTIVITAMIN) tablet Take 1 tablet by mouth daily.     [provider]  Na Sulfate-K Sulfate-Mg Sulf 17.5-3.13-1.6 GM/177ML SOLN Take 1 kit by mouth as directed. 05/11/23   Sherrilyn Rist, MD  nitroGLYCERIN (NITROSTAT) 0.4 MG SL tablet Place 1 tablet (0.4 mg total) under the tongue every 5 (five) minutes as needed for chest pain. 08/16/18 06/28/22  Lennette Bihari, MD  Prucalopride Succinate (MOTEGRITY) 2 MG TABS Take 1 tablet (2 mg total) by mouth daily. 03/31/23 03/25/24  Sherrilyn Rist, MD  testosterone cypionate (DEPOTESTOTERONE CYPIONATE) 200 MG/ML injection Inject 200 mg into the muscle every 28 (  twenty-eight) days.  12/04/12   [provider]    Physical Exam: Vitals:   06/06/23 0724 06/06/23 0730 06/06/23 0800 06/06/23 0830  BP:  114/80 115/82 122/74  Pulse: 80 79 79 72  Resp: 16 16 16 15   Temp:      TempSrc:      SpO2: 96% 98% 99% 100%      Constitutional: Elderly male currently no acute distress Eyes: PERRL, lids and conjunctivae normal ENMT: Mucous membranes are moist.  .Normal dentition.  Neck: normal, supple, no JVD Respiratory: clear to auscultation bilaterally, no wheezing, no crackles. Normal respiratory effort. No accessory muscle use.  Cardiovascular: Regular rate and rhythm, no murmurs / rubs / gallops.  1+ pitting edema of the right lower extremity.   2+ pedal pulses.  Abdomen: no tenderness, no masses palpated. Bowel sounds positive.  Musculoskeletal: no clubbing / cyanosis. No joint deformity upper and lower extremities. Good ROM, no contractures. Normal muscle tone.  Skin: no rashes, lesions, ulcers. No induration Neurologic: CN 2-12 grossly intact. Sensation intact, DTR normal. Strength 5/5 in all 4.  Psychiatric: Normal judgment and insight. Alert and oriented x 3. Normal mood.   Data Reviewed:  EKG revealed a sinus rhythm 83 bpm with premature atrial complex.  Reviewed labs, imaging, and pertinent records as noted above in HPI  Assessment and Plan:  BPPV Patient presented with  complaints of dizziness with the room reported spinning around him..  Patient did not have any focal weakness.  CT and subsequent MRI of the brain did not note any acute abnormality. -Admit to telemetry bed -Check orthostatic vital signs -Check TSH -Vestibular PT to eval and treat -Follow-up telemetry -Transitions of care consulted for possible need of outpatient rehab  Essential hypertension Blood pressures currently maintained. -Continue metoprolol and isosorbide mononitrate'  Hyponatremia Acute.  Sodium level was noted to be 134 yesterday when checked.    -Recheck BMP in a.m.  CAD Patient with prior drug-eluting stent to the LAD in 11/2017. -Continue aspirin, Plavix, statin  Hyperlipidemia -Continue atorvastatin and Zetia  Obesity BMI 33.50 kg/m   DVT prophylaxis: Lovenox Advance Care Planning:   Code Status: Full Code    Consults: None  Family Communication: Patient stated that he had already updated her wife when asked  Severity of Illness: The appropriate patient status for this patient is OBSERVATION. Observation status is judged to be reasonable and necessary in order to provide the required intensity of service to ensure the patient's safety. The patient's presenting symptoms, physical exam findings, and initial radiographic and laboratory data in the context of their medical condition is felt to place them at decreased risk for further clinical deterioration. Furthermore, it is anticipated that the patient will be medically stable for discharge from the hospital within 2 midnights of admission.   Author: Clydie Braun, MD 06/06/2023 10:38 AM  For on call review [email or web address].

## 2023-06-06 NOTE — ED Notes (Signed)
Assumed care of patient.

## 2023-06-06 NOTE — ED Notes (Signed)
O2 @ 2L via [place] applied to maintain O2 Sats >93%,

## 2023-06-06 NOTE — ED Notes (Signed)
ED TO INPATIENT HANDOFF REPORT  ED Nurse Name and Phone #: Cat [phone]  S Name/Age/Gender Ryan Scott 72 y.o. male Room/Bed: 042C/042C  Code Status   Code Status: Full Code  Home/SNF/Other Home Patient oriented to: self, place, time, and situation Is this baseline? Yes   Triage Complete: Triage complete  Chief Complaint Dizziness [R42]  Triage Note Dizziness, emesis since 1000 today. Feels like room is spinning. -CP, -SOB. No focal neuro deficits appreciated in triage.   Right hip rep 3 weeks ago- same leg appears swollen- pt and family report physical therapy has not been concerned about this and is 'normal' part of healing process- swelling has been imporving since surg. Pulses intact bilateral lower extremities.   HX cardiac stent-2019. Hx vertigo 2 years ago.   Allergies No Known Allergies  Level of Care/Admitting Diagnosis ED Disposition     ED Disposition  Admit   Condition  --   Comment  Hospital Area: [facility] [[id number]]  Level of Care: Telemetry Medical [104]  May place patient in observation at [facility] or [facility] if equivalent level of care is available:: No  Covid Evaluation: Asymptomatic - no recent exposure (last 10 days) testing not required  Diagnosis: Dizziness [400867]  Admitting Physician: Clydie Braun [[id number]]  Attending Physician: Clydie Braun [[id number]]          B Medical/Surgery History Past Medical History:  Diagnosis Date   Arthritis    "back, neck, shoulders" (12/19/2017)   Bladder cancer (HCC)    Borderline hypertension    BPH (benign prostatic hyperplasia)    Chronic lower back pain    ED (erectile dysfunction)    High cholesterol    "started BP RX 12/18/2017"   History of adenomatous polyp of colon    tubular adenoma   History of kidney stones    Hypertension    Hypogonadism male    Left ureteral stone    Lower urinary tract symptoms (LUTS)    RBBB (right bundle branch block)     Past Surgical History:  Procedure Laterality Date   CARDIOVASCULAR STRESS TEST  10/16/2012   normal myocardial perfusion study,  no ischemia/  not gated   COLONOSCOPY  last one 07-26-2013   CORONARY STENT INTERVENTION N/A 12/19/2017   Procedure: CORONARY STENT INTERVENTION;  Surgeon: Lennette Bihari, MD;  Location: [facility] INVASIVE CV LAB;  Service: Cardiovascular;  Laterality: N/A;   CYSTOSCOPY/RETROGRADE/URETEROSCOPY/STONE EXTRACTION WITH BASKET  1989   KNEE SURGERY Left 08/2021   LEFT HEART CATH AND CORONARY ANGIOGRAPHY N/A 12/19/2017   Procedure: LEFT HEART CATH AND CORONARY ANGIOGRAPHY;  Surgeon: Lennette Bihari, MD;  Location: [facility] INVASIVE CV LAB;  Service: Cardiovascular;  Laterality: N/A;  LAD    PROSTATE SURGERY  2002   PROSTATE SURGERY  2002   REFRACTIVE SURGERY Bilateral    SHOULDER ARTHROSCOPY WITH SUBACROMIAL DECOMPRESSION, ROTATOR CUFF REPAIR AND BICEP TENDON REPAIR Left 2013   SHOULDER SURGERY  09/2020   rotator cuff surgery   TONSILLECTOMY  ~ 1956   TRANSTHORACIC ECHOCARDIOGRAM  10/16/2012   normal LVF, ef 55-65%,  mild AV sclerosis without stenosis,  mild MR and TR,  mild LAE,  trivial PR   TRANSURETHRAL RESECTION OF BLADDER TUMOR WITH GYRUS (TURBT-GYRUS)  1989   and URETEROSCOPIC STONE EXTRACTION     A IV Location/Drains/Wounds Patient Lines/Drains/Airways Status     Active Line/Drains/Airways     Name Placement date Placement time Site Days   Peripheral  IV 06/06/23 20 G Right Antecubital 06/06/23  0110  Antecubital  less than 1            Intake/Output Last 24 hours  Intake/Output Summary (Last 24 hours) at 06/06/2023 1145 Last data filed at 06/06/2023 1025 Gross per 24 hour  Intake 1500 ml  Output --  Net 1500 ml    Labs/Imaging Results for orders placed or performed during the hospital encounter of 06/06/23 (from the past 48 hour(s))  Basic metabolic panel     Status: Abnormal   Collection Time: 06/05/23  8:40 PM  Result Value Ref Range   Sodium  134 (L) 135 - 145 mmol/L   Potassium 3.8 3.5 - 5.1 mmol/L   Chloride 102 98 - 111 mmol/L   CO2 21 (L) 22 - 32 mmol/L   Glucose, Bld 122 (H) 70 - 99 mg/dL    Comment: Glucose reference range applies only to samples taken after fasting for at least 8 hours.   BUN 15 8 - 23 mg/dL   Creatinine, Ser 1.61 0.61 - 1.24 mg/dL   Calcium 8.9 8.9 - 09.6 mg/dL   GFR, Estimated >04 >54 mL/min    Comment: (NOTE) Calculated using the CKD-EPI Creatinine Equation (2021)    Anion gap 11 5 - 15    Comment: Performed at [facility], [address] [place], [place] [address]  CBC     Status: None   Collection Time: 06/05/23  8:40 PM  Result Value Ref Range   WBC 9.9 4.0 - 10.5 K/uL   RBC 5.32 4.22 - 5.81 MIL/uL   Hemoglobin 15.5 13.0 - 17.0 g/dL   HCT 91.4 78.2 - 95.6 %   MCV 87.8 80.0 - 100.0 fL   MCH 29.1 26.0 - 34.0 pg   MCHC 33.2 30.0 - 36.0 g/dL   RDW 21.3 08.6 - 57.8 %   Platelets 304 150 - 400 K/uL   nRBC 0.0 0.0 - 0.2 %    Comment: Performed at [facility], [address], [place], [place] [address]  Troponin I (High Sensitivity)     Status: None   Collection Time: 06/06/23  7:06 AM  Result Value Ref Range   Troponin I (High Sensitivity) 5 <18 ng/L    Comment: (NOTE) Elevated high sensitivity troponin I (hsTnI) values and significant  changes across serial measurements may suggest ACS but many other  chronic and acute conditions are known to elevate hsTnI results.  Refer to the "Links" section for chest pain algorithms and additional  guidance. Performed at [facility], [address] [place], [place] [address]   Magnesium     Status: None   Collection Time: 06/06/23  7:06 AM  Result Value Ref Range   Magnesium 1.9 1.7 - 2.4 mg/dL    Comment: Performed at [facility], [address] [place], [place] [address]   MR BRAIN WO CONTRAST  Result Date: 06/06/2023 CLINICAL DATA:  72 year old male with dizziness, vomiting,  neurologic deficit. EXAM: MRI HEAD WITHOUT CONTRAST TECHNIQUE: Multiplanar, multiecho pulse sequences of the brain and surrounding structures were obtained without intravenous contrast. COMPARISON:  Head CT yesterday. FINDINGS: Brain: Cerebral volume is within normal limits for age. No restricted diffusion to suggest acute infarction. No midline shift, mass effect, evidence of mass lesion, ventriculomegaly, extra-axial collection or acute intracranial hemorrhage. Cervicomedullary junction and pituitary are within normal limits. Largely normal for age gray and white matter signal in the brain. But there is  a small area of cystic appearing gliosis in the left posterior centrum semiovale resembling a small chronic white matter lacune on series 7, image 40, and punctate hemosiderin there on SWI. But no cortical encephalomalacia identified. And no definite additional chronic cerebral blood products in the brain. Deep gray nuclei, brainstem and cerebellum appear normal for age. Vascular: Major intracranial vascular flow voids are preserved. Skull and upper cervical spine: Negative for age visible cervical spine. Visualized bone marrow signal is within normal limits. Sinuses/Orbits: Rightward gaze. But otherwise negative orbits. Paranasal Visualized paranasal sinuses and mastoids are stable and well aerated. Other: Visible internal auditory structures appear normal. Negative visible scalp and face. IMPRESSION: 1. No acute intracranial abnormality. 2. Mild for age chronic small vessel disease, in the left centrum semiovale. Electronically Signed   By: Odessa Fleming M.D.   On: 06/06/2023 05:08   CT HEAD WO CONTRAST ( )  Result Date: 06/06/2023 CLINICAL DATA:  Vomiting and dizziness. EXAM: CT HEAD WITHOUT CONTRAST TECHNIQUE: Contiguous axial images were obtained from the base of the skull through the vertex without intravenous contrast. RADIATION DOSE REDUCTION: This exam was performed according to the departmental  dose-optimization program which includes automated exposure control, adjustment of the mA and/or kV according to patient size and/or use of iterative reconstruction technique. COMPARISON:  None Available. FINDINGS: Brain: There is mild cerebral atrophy with widening of the extra-axial spaces and ventricular dilatation. There are areas of decreased attenuation within the white matter tracts of the supratentorial brain, consistent with microvascular disease changes. Vascular: No hyperdense vessel or unexpected calcification. Skull: Normal. Negative for fracture or focal lesion. Sinuses/Orbits: A 5 mm left maxillary sinus polyp versus mucous retention cyst is seen. Other: None. IMPRESSION: 1. Generalized cerebral atrophy with chronic white matter small vessel ischemic changes. 2. No acute intracranial abnormality. Electronically Signed   By: Aram Candela M.D.   On: 06/06/2023 00:33    Pending Labs Unresulted Labs (From admission, onward)     Start     Ordered   06/07/23 0500  CBC  Tomorrow morning,   R        06/06/23 1052   06/07/23 0500  Basic metabolic panel  Tomorrow morning,   R        06/06/23 1052   06/06/23 1054  TSH  Add-on,   AD        06/06/23 1053            Vitals/Pain Today's Vitals   06/06/23 0800 06/06/23 0830 06/06/23 1000 06/06/23 1118  BP: 115/82 122/74 126/87   Pulse: 79 72 73   Resp: 16 15 16    Temp:    97.9 F (36.6 C)  TempSrc:      SpO2: 99% 100% 98%   PainSc:        Isolation Precautions No active isolations  Medications Medications  enoxaparin (LOVENOX) injection 40 mg (has no administration in time range)  sodium chloride flush (NS) 0.9 % injection 3 mL (3 mLs Intravenous Given 06/06/23 1113)  acetaminophen (TYLENOL) tablet 650 mg (has no administration in time range)    Or  acetaminophen (TYLENOL) suppository 650 mg (has no administration in time range)  albuterol (PROVENTIL) (2.5 MG/3ML) 0.083% nebulizer solution 2.5 mg (has no administration in  time range)  ondansetron (ZOFRAN-ODT) disintegrating tablet 4 mg (4 mg Oral Given 06/05/23 2043)  LORazepam (ATIVAN) injection 1 mg (1 mg Intravenous Given 06/06/23 0121)  sodium chloride 0.9 % bolus 500 mL (0 mLs Intravenous Stopped 06/06/23  0740)  metoCLOPramide (REGLAN) injection 10 mg (10 mg Intravenous Given 06/06/23 0423)  lactated ringers bolus 1,000 mL (0 mLs Intravenous Stopped 06/06/23 1025)  midazolam (VERSED) injection 2 mg (2 mg Intravenous Given 06/06/23 0718)    Mobility walks with person assist     Focused Assessments Neuro Assessment Handoff:  Swallow screen pass?    Cardiac Rhythm: Normal sinus rhythm NIH Stroke Scale  Dizziness Present: Yes Headache Present: No Interval: Initial Level of Consciousness (1a.)   : Alert, keenly responsive LOC Questions (1b. )   : Answers both questions correctly LOC Commands (1c. )   : Performs both tasks correctly Best Gaze (2. )  : Normal Visual (3. )  : No visual loss Facial Palsy (4. )    : Normal symmetrical movements Motor Arm, Left (5a. )   : No drift Motor Arm, Right (5b. ) : No drift Motor Leg, Left (6a. )  : No drift Motor Leg, Right (6b. ) : No drift Limb Ataxia (7. ): Absent Sensory (8. )  : Normal, no sensory loss Best Language (9. )  : No aphasia Dysarthria (10. ): Normal Extinction/Inattention (11.)   : No Abnormality Complete NIHSS TOTAL: 0     Neuro Assessment: Exceptions to WDL (Patient C/O dizziness and nausea) Neuro Checks:   Initial (06/06/23 0308)  Has TPA been given?  If patient is a Neuro Trauma and patient is going to OR before floor call report to 4N Charge nurse: [phone] or [phone]   R Recommendations: See Admitting Provider Note  Report given to:   Additional Notes:

## 2023-06-06 NOTE — ED Notes (Signed)
Patient to MRI.

## 2023-06-06 NOTE — ED Notes (Signed)
Order carnification with Dr. Clayborne Dana for Versed 5mg , Dr. Clayborne Dana changed dose to Versed 2mg  IVP

## 2023-06-07 ENCOUNTER — Other Ambulatory Visit: Payer: Self-pay | Admitting: Cardiovascular Disease

## 2023-06-07 DIAGNOSIS — H8110 Benign paroxysmal vertigo, unspecified ear: Secondary | ICD-10-CM | POA: Diagnosis not present

## 2023-06-07 LAB — BASIC METABOLIC PANEL
Anion gap: 11 (ref 5–15)
BUN: 8 mg/dL (ref 8–23)
CO2: 23 mmol/L (ref 22–32)
Calcium: 8.6 mg/dL — ABNORMAL LOW (ref 8.9–10.3)
Chloride: 105 mmol/L (ref 98–111)
Creatinine, Ser: 0.99 mg/dL (ref 0.61–1.24)
GFR, Estimated: >60 mL/min (ref >60)
Glucose, Bld: 84 mg/dL (ref 70–99)
Potassium: 3.5 mmol/L (ref 3.5–5.1)
Sodium: 139 mmol/L (ref 135–145)

## 2023-06-07 LAB — CBC
HCT: 43 % (ref 39.0–52.0)
Hemoglobin: 13.7 g/dL (ref 13.0–17.0)
MCH: 28.6 pg (ref 26.0–34.0)
MCHC: 31.9 g/dL (ref 30.0–36.0)
MCV: 89.8 fL (ref 80.0–100.0)
Platelets: 297 10*3/uL (ref 150–400)
RBC: 4.79 MIL/uL (ref 4.22–5.81)
RDW: 13.8 % (ref 11.5–15.5)
WBC: 10.4 10*3/uL (ref 4.0–10.5)
nRBC: 0 % (ref 0.0–0.2)

## 2023-06-07 MED ORDER — MECLIZINE HCL 25 MG PO TABS: 25.0000 mg | ORAL_TABLET | Freq: Two times a day (BID) | ORAL | 1 refills | Status: DC | PRN

## 2023-06-07 NOTE — Discharge Summary (Signed)
Physician Discharge Summary  Ryan Scott ZOX:[id number] DOB: 28-Jun-1951 DOA: 06/06/2023  PCP: Jackelyn Poling, DO  Admit date: 06/06/2023 Discharge date: 06/07/2023  Admitted From: Home Disposition: Home  Recommendations for Outpatient Follow-up:  Follow up with PCP in 1-2 weeks Continue follow-up with [facility] physical therapy, now will need vestibular rehab for BPPV  Rx sent for meclizine as needed for dizziness/vertigo  Home Health: Outpatient PT Equipment/Devices: None  Discharge Condition: Stable CODE STATUS: Full code Diet recommendation: Heart healthy diet  History of present illness:  Ryan Scott is a 72 year old male with past medical history significant for HTN, HLD, CAD s/p DES, nephrolithiasis, bladder cancer who presented to [facility] ED on 9/9 with dizziness, nausea/vomiting.  Symptoms started while he was sitting on the couch watching TV at 10 AM yesterday morning.  He reported that the whole room was spinning around him like he was on a merry-go-round.  Symptoms lasted 1 to 2 minutes and seem to improve shortly for him to get up but then resumed and seem like the room was spinning the other way.  He reported getting nauseous and having an episodes of vomiting.  He went and lay down and took a nap.  However, symptoms returned and constant for which he came to the hospital for further evaluation.  He reports chronically having right leg swelling since he has had total hip replacement.  He had not had any significant fever, focal weakness, change in speech, change in vision, chest pain, cough, shortness of breath, palpitations, abdominal pain, or dysuria symptoms.   In the emergency room patient was noted to be afebrile with stable vital signs.  He had been placed on 2 L of nasal cannula oxygen 93%.  Labs noted sodium 134, BUN 15, creatinine 0.88, and glucose 1.22.  CT scan of the head noted generalized cerebral atrophy without acute abnormality.  MRI of the  brain was negative for any acute abnormality.  Patient having given a total of 1.5 L of IV fluids, Reglan, Ativan, and Zofran.  Despite this patient for which [facility] called to admit  Hospital course:  BPPV Patient presented with complaints of dizziness with the room reported spinning around him.  Patient did not have any focal weakness.  CT and subsequent MRI of the brain did not note any acute abnormality.  TSH 1.238 and troponin 5, within normal limits.  Patient was seen by physical therapy in which they performed Epley maneuver with drastic improvement of his symptoms.  Patient now able to ambulate without issue and dizziness relatively resolved.  Will continue meclizine as needed and placed outpatient referral to physical therapy at [facility] which he is currently established with for further vestibular rehab.  Outpatient follow-up with PCP.   Essential hypertension Continue metoprolol and isosorbide mononitrate'   Hyponatremia: Resolved Sodium level was noted to be 134 on admission.  Repeated BMP with sodium now normalized 139.   CAD Patient with prior drug-eluting stent to the LAD in 11/2017. Continue aspirin, Plavix, statin   Hyperlipidemia Continue atorvastatin and Zetia   Obesity BMI 33.50 kg/m.  Complicates all facets of care  Discharge Diagnoses:  Principal Problem:   BPPV (benign paroxysmal positional vertigo) Active Problems:   Essential hypertension   Hyponatremia   CAD S/P percutaneous coronary angioplasty   Hyperlipidemia   Obesity (BMI 30-39.9)    Discharge Instructions  Discharge Instructions     Ambulatory referral to Physical Therapy   Complete by: As directed  Call MD for:  difficulty breathing, headache or visual disturbances   Complete by: As directed    Call MD for:  extreme fatigue   Complete by: As directed    Call MD for:  persistant dizziness or light-headedness   Complete by: As directed    Call MD for:  persistant nausea and vomiting    Complete by: As directed    Call MD for:  redness, tenderness, or signs of infection (pain, swelling, redness, odor or green/yellow discharge around incision site)   Complete by: As directed    Call MD for:  severe uncontrolled pain   Complete by: As directed    Call MD for:  temperature >100.4   Complete by: As directed    Diet - low sodium heart healthy   Complete by: As directed    Increase activity slowly   Complete by: As directed       Allergies as of 06/07/2023   No Known Allergies      Medication List     STOP taking these medications    Na Sulfate-K Sulfate-Mg Sulf 17.5-3.13-1.6 GM/177ML Soln       TAKE these medications    allopurinol 100 MG tablet Commonly known as: ZYLOPRIM Take 100 mg by mouth daily.   aspirin 81 MG chewable tablet Chew 1 tablet (81 mg total) by mouth daily.   atorvastatin 40 MG tablet Commonly known as: LIPITOR TAKE 1 TABLET BY MOUTH EVERY DAY   bisacodyl 5 MG EC tablet Take 5 mg by mouth daily as needed for moderate constipation.   clopidogrel 75 MG tablet Commonly known as: PLAVIX TAKE 1 TABLET BY MOUTH EVERY DAY   ezetimibe 10 MG tablet Commonly known as: ZETIA TAKE 1 TABLET BY MOUTH EVERY DAY   isosorbide mononitrate 30 MG 24 hr tablet Commonly known as: IMDUR TAKE 1/2 TABLET (15 MG TOTAL) BY MOUTH DAILY.   meclizine 25 MG tablet Commonly known as: ANTIVERT Take 1 tablet (25 mg total) by mouth 2 (two) times daily as needed for dizziness.   metoprolol tartrate 25 MG tablet Commonly known as: LOPRESSOR TAKE 1/2 TABLET TWICE A DAY BY MOUTH   Motegrity 2 MG Tabs Generic drug: Prucalopride Succinate Take 1 tablet (2 mg total) by mouth daily.   nitroGLYCERIN 0.4 MG SL tablet Commonly known as: NITROSTAT Place 1 tablet (0.4 mg total) under the tongue every 5 (five) minutes as needed for chest pain.   oxyCODONE 5 MG immediate release tablet Commonly known as: Oxy IR/ROXICODONE Take 5 mg by mouth every 4 (four) hours  as needed.   polyethylene glycol 17 g packet Commonly known as: MIRALAX / GLYCOLAX Take 34 g by mouth daily.   testosterone cypionate 200 MG/ML injection Commonly known as: DEPOTESTOSTERONE CYPIONATE Inject 200 mg into the muscle every 28 (twenty-eight) days.   Vitamin B12 1000 MCG Tbcr Take 1 tablet by mouth daily.        Follow-up Information     Jackelyn Poling, DO. Schedule an appointment as soon as possible for a visit in 1 week(s).   Specialty: Family Medicine Contact information: [address] [place] [address] [phone]         [facility] Follow up.   Specialty: Physical Therapy Why: For vestibular physical therapy Contact information: [facility] [address] [place] [address] [phone]                No  Known Allergies  Consultations: None   Procedures/Studies: MR BRAIN WO CONTRAST  Result Date: 06/06/2023 CLINICAL DATA:  72 year old male with dizziness, vomiting, neurologic deficit. EXAM: MRI HEAD WITHOUT CONTRAST TECHNIQUE: Multiplanar, multiecho pulse sequences of the brain and surrounding structures were obtained without intravenous contrast. COMPARISON:  Head CT yesterday. FINDINGS: Brain: Cerebral volume is within normal limits for age. No restricted diffusion to suggest acute infarction. No midline shift, mass effect, evidence of mass lesion, ventriculomegaly, extra-axial collection or acute intracranial hemorrhage. Cervicomedullary junction and pituitary are within normal limits. Largely normal for age gray and white matter signal in the brain. But there is a small area of cystic appearing gliosis in the left posterior centrum semiovale resembling a small chronic white matter lacune on series 7, image 40, and punctate hemosiderin there on SWI. But no cortical encephalomalacia identified. And no definite additional chronic cerebral blood products in the brain. Deep gray nuclei,  brainstem and cerebellum appear normal for age. Vascular: Major intracranial vascular flow voids are preserved. Skull and upper cervical spine: Negative for age visible cervical spine. Visualized bone marrow signal is within normal limits. Sinuses/Orbits: Rightward gaze. But otherwise negative orbits. Paranasal Visualized paranasal sinuses and mastoids are stable and well aerated. Other: Visible internal auditory structures appear normal. Negative visible scalp and face. IMPRESSION: 1. No acute intracranial abnormality. 2. Mild for age chronic small vessel disease, in the left centrum semiovale. Electronically Signed   By: Odessa Fleming M.D.   On: 06/06/2023 05:08   CT HEAD WO CONTRAST ( )  Result Date: 06/06/2023 CLINICAL DATA:  Vomiting and dizziness. EXAM: CT HEAD WITHOUT CONTRAST TECHNIQUE: Contiguous axial images were obtained from the base of the skull through the vertex without intravenous contrast. RADIATION DOSE REDUCTION: This exam was performed according to the departmental dose-optimization program which includes automated exposure control, adjustment of the mA and/or kV according to patient size and/or use of iterative reconstruction technique. COMPARISON:  None Available. FINDINGS: Brain: There is mild cerebral atrophy with widening of the extra-axial spaces and ventricular dilatation. There are areas of decreased attenuation within the white matter tracts of the supratentorial brain, consistent with microvascular disease changes. Vascular: No hyperdense vessel or unexpected calcification. Skull: Normal. Negative for fracture or focal lesion. Sinuses/Orbits: A 5 mm left maxillary sinus polyp versus mucous retention cyst is seen. Other: None. IMPRESSION: 1. Generalized cerebral atrophy with chronic white matter small vessel ischemic changes. 2. No acute intracranial abnormality. Electronically Signed   By: Aram Candela M.D.   On: 06/06/2023 00:33     Subjective:   Discharge Exam: Vitals:    06/06/23 2059 06/07/23 0719  BP: 105/65 115/64  Pulse: 67 73  Resp:  18  Temp: 97.7 F (36.5 C) 97.8 F (36.6 C)  SpO2: 95% 95%   Vitals:   06/06/23 1305 06/06/23 1630 06/06/23 2059 06/07/23 0719  BP:  118/84 105/65 115/64  Pulse:  64 67 73  Resp:    18  Temp:  97.9 F (36.6 C) 97.7 F (36.5 C) 97.8 F (36.6 C)  TempSrc:  Oral Oral Oral  SpO2:  94% 95% 95%  Weight: 104.4 kg     Height: 5' 9.5" (1.765 m)       Physical Exam: GEN: NAD, alert and oriented x 3, obese HEENT: NCAT, PERRL, EOMI, sclera clear, MMM PULM: CTAB w/o wheezes/crackles, normal respiratory effort, on room air CV: RRR w/o M/G/R GI: abd soft, NTND, NABS, no R/G/M MSK: no peripheral edema, muscle strength globally intact 5/5  bilateral upper/lower extremities NEURO: CN II-XII intact, no focal deficits, sensation to light touch intact PSYCH: normal mood/affect Integumentary: Noted surgical incision site right hip, Steri-Strips in place without fluctuance/erythema, clean/dry/intact, otherwise no concerning rashes/lesions/wounds noted on exposed skin surfaces.      The results of significant diagnostics from this hospitalization (including imaging, microbiology, ancillary and laboratory) are listed below for reference.     Microbiology: No results found for this or any previous visit (from the past 240 hour(s)).   Labs: BNP (last 3 results) No results for input(s): "BNP" in the last 8760 hours. Basic Metabolic Panel: Recent Labs  Lab 06/05/23 2040 06/06/23 0706 06/07/23 0607  NA 134*  --  139  K 3.8  --  3.5  CL 102  --  105  CO2 21*  --  23  GLUCOSE 122*  --  84  BUN 15  --  8  CREATININE 0.88  --  0.99  CALCIUM 8.9  --  8.6*  MG  --  1.9  --    Liver Function Tests: No results for input(s): "AST", "ALT", "ALKPHOS", "BILITOT", "PROT", "ALBUMIN" in the last 168 hours. No results for input(s): "LIPASE", "AMYLASE" in the last 168 hours. No results for input(s): "AMMONIA" in the last 168  hours. CBC: Recent Labs  Lab 06/05/23 2040 06/07/23 0607  WBC 9.9 10.4  HGB 15.5 13.7  HCT 46.7 43.0  MCV 87.8 89.8  PLT 304 297   Cardiac Enzymes: No results for input(s): "CKTOTAL", "CKMB", "CKMBINDEX", "TROPONINI" in the last 168 hours. BNP: Invalid input(s): "POCBNP" CBG: No results for input(s): "GLUCAP" in the last 168 hours. D-Dimer No results for input(s): "DDIMER" in the last 72 hours. Hgb A1c No results for input(s): "HGBA1C" in the last 72 hours. Lipid Profile No results for input(s): "CHOL", "HDL", "LDLCALC", "TRIG", "CHOLHDL", "LDLDIRECT" in the last 72 hours. Thyroid function studies Recent Labs    06/06/23 1426  TSH 1.238   Anemia work up No results for input(s): "VITAMINB12", "FOLATE", "FERRITIN", "TIBC", "IRON", "RETICCTPCT" in the last 72 hours. Urinalysis No results found for: "COLORURINE", "APPEARANCEUR", "LABSPEC", "PHURINE", "GLUCOSEU", "HGBUR", "BILIRUBINUR", "KETONESUR", "PROTEINUR", "UROBILINOGEN", "NITRITE", "LEUKOCYTESUR" Sepsis Labs Recent Labs  Lab 06/05/23 2040 06/07/23 0607  WBC 9.9 10.4   Microbiology No results found for this or any previous visit (from the past 240 hour(s)).   Time coordinating discharge: Over 30 minutes  SIGNED:   Alvira Philips [place], DO  [facility] 06/07/2023, 8:48 AM

## 2023-06-07 NOTE — Plan of Care (Signed)
  Problem: Education: Goal: Knowledge of General Education information will improve Description: Including pain rating scale, medication(s)/side effects and non-pharmacologic comfort measures Outcome: Progressing   Problem: Health Behavior/Discharge Planning: Goal: Ability to manage health-related needs will improve Outcome: Progressing   Problem: Clinical Measurements: Goal: Ability to maintain clinical measurements within normal limits will improve Outcome: Progressing Goal: Will remain free from infection Outcome: Progressing Goal: Diagnostic test results will improve Outcome: Progressing Goal: Respiratory complications will improve Outcome: Progressing Goal: Cardiovascular complication will be avoided Outcome: Progressing   Problem: Coping: Goal: Level of anxiety will decrease Outcome: Progressing   Problem: Elimination: Goal: Will not experience complications related to bowel motility Outcome: Progressing Goal: Will not experience complications related to urinary retention Outcome: Progressing   Problem: Skin Integrity: Goal: Risk for impaired skin integrity will decrease Outcome: Progressing

## 2023-06-07 NOTE — TOC Transition Note (Signed)
Transition of Care [facility]) - CM/SW Discharge Note   Patient Details  Name: Ryan Scott MRN: [id number] Date of Birth: 08/24/1951  [facility] of [facility]) CM/SW Contact:  Tom-Johnson, Hershal Coria, RN Phone Number: 06/07/2023, 12:06 PM   Clinical Narrative:     Patient is scheduled for discharge today.  Outpatient referral, hospital f/u and discharge instructions on AVS. Wife, Elease Hashimoto to transport at discharge.  No further TOC needs noted.       Final next level of care: OP Rehab Barriers to Discharge: Barriers Resolved   Patient Goals and CMS Choice CMS Medicare.gov Compare Post Acute Care list provided to:: Patient Choice offered to / list presented to : Patient  Discharge Placement                  Patient to be transferred to facility by: Wife Name of family member notified: Elease Hashimoto    Discharge Plan and Services Additional resources added to the After Visit Summary for                  DME Arranged: N/A DME Agency: NA       HH Arranged: NA HH Agency: NA        Social Determinants of Health (SDOH) Interventions SDOH Screenings   Food Insecurity: No Food Insecurity (06/06/2023)  Housing: Low Risk  (06/06/2023)  Transportation Needs: No Transportation Needs (06/06/2023)  Utilities: Not At Risk (06/06/2023)  Tobacco Use: Medium Risk (06/06/2023)     Readmission Risk Interventions     No data to display

## 2023-06-07 NOTE — Plan of Care (Signed)

## 2023-06-07 NOTE — Progress Notes (Signed)
Patient and wife alert and oriented. DC instructions reviewed, both verbalized understanding of dc instructions.VSS.No complaints. All belongings given to patient. Volunteer to transport patient to the main hospital entrance to meet ride.

## 2023-07-04 ENCOUNTER — Ambulatory Visit (INDEPENDENT_AMBULATORY_CARE_PROVIDER_SITE_OTHER): Payer: PPO | Admitting: Otolaryngology

## 2023-07-04 ENCOUNTER — Ambulatory Visit (INDEPENDENT_AMBULATORY_CARE_PROVIDER_SITE_OTHER): Payer: PPO | Admitting: Audiology

## 2023-07-04 ENCOUNTER — Encounter (INDEPENDENT_AMBULATORY_CARE_PROVIDER_SITE_OTHER): Payer: Self-pay | Admitting: Otolaryngology

## 2023-07-04 VITALS — Ht 69.0 in | Wt 235.0 lb

## 2023-07-04 DIAGNOSIS — R42 Dizziness and giddiness: Secondary | ICD-10-CM

## 2023-07-04 DIAGNOSIS — H903 Sensorineural hearing loss, bilateral: Secondary | ICD-10-CM | POA: Diagnosis not present

## 2023-07-04 NOTE — Progress Notes (Unsigned)
[facility] [address], [address] [place], [place] [address]  Audiological Evaluation   Ryan Scott was referred today for a hearing evaluation by Dr. Amanda Pea Teoh due to vertigo concerns.   History: Symptoms Yes Details  Hearing Loss Yes Patient noticed gradual hearing loss.  Tinnitus Yes Patient reported bilateral tinnitus.  Ear Pain No Patient denied otalgia at this time.  Balance Problems Yes Patient reported staying two nights in the hospital due to spinning vertigo. He has attended three sessions of physical therapy since then.  Amplification No Patient denied the use of hearing aids at this time.    Otoscopy: Right ear: Clear external ear canals and normal landmarks in the tympanic membrane. Left ear: Clear external ear canals and normal landmarks in the tympanic membrane.    Tympanogram: Right ear: Could not obtain seal; unable to determine middle ear status at this time. Left ear: Normal external ear canal volume with normal middle ear pressure and tympanic membrane compliance (Type A).   Hearing Evaluation: The audiogram was completed using conventional audiometric techniques under headphones with good reliability.   The hearing test results indicate: Right ear: Normal hearing sensitivity from [phone] Hz gradually sloping to moderately-severe sensorineural hearing loss from 1500-8000 Hz. Left ear: Normal hearing sensitivity from [phone] Hz gradually sloping to moderately-severe sensorineural hearing loss from 1500-8000 Hz.   Speech Recognition Thresholds were obtained at 25 dBHL in the right ear and 25 dBHL in the left ear.   Word Recognition Testing was completed using NU-6 word lists at 65 dBHL in the right ear and at 65 dBHL in the left ear and the patient scored 92% in the right ear and 92% in the left ear.   Recommendations: Repeat audiogram when changes are perceived or per MD. Patient is a candidate for hearing amplification,  consider a hearing aid evaluation. Consider various tinnitus strategies, including the use of a noise generator, hearing aids, or tinnitus retraining therapy.   Ryan Scott, AUD  07/04/23

## 2023-07-05 ENCOUNTER — Telehealth: Payer: Self-pay | Admitting: Gastroenterology

## 2023-07-05 DIAGNOSIS — H903 Sensorineural hearing loss, bilateral: Secondary | ICD-10-CM | POA: Insufficient documentation

## 2023-07-05 DIAGNOSIS — R42 Dizziness and giddiness: Secondary | ICD-10-CM | POA: Insufficient documentation

## 2023-07-05 NOTE — Telephone Encounter (Signed)
Patient called requested to speak with a nurse stated he has diarrhea and nothing that has been done so far has worked. Please advise.

## 2023-07-05 NOTE — Progress Notes (Signed)
Patient ID: Ryan Scott, male   DOB: [birth year], 72 y.o.   MRN: [id number]  CC: Recurrent dizziness  HPI:  Ryan Scott is an 72 y.o. male who presents today complaining of recurrent dizziness for the past 4 weeks.  According to the patient, he had an episode of severe spinning vertigo 4 weeks ago.  The vertigo lasted for several hours.  He was hospitalized at [facility] for 2 nights.  His MRI and CT scans were all negative.  He was treated symptomatically with meclizine.  The patient has not had any spinning vertigo since the initial episode.  However, he continues to have occasional off-balance sensation.  He is currently seeing a physical therapist at [facility].  Currently he denies any otalgia, otorrhea, or significant change in his hearing.  He has no previous ENT surgery.  Past Medical History:  Diagnosis Date   Arthritis    "back, neck, shoulders" (12/19/2017)   Bladder cancer (HCC)    Borderline hypertension    BPH (benign prostatic hyperplasia)    Chronic lower back pain    ED (erectile dysfunction)    High cholesterol    "started BP RX 12/18/2017"   History of adenomatous polyp of colon    tubular adenoma   History of kidney stones    Hypertension    Hypogonadism male    Left ureteral stone    Lower urinary tract symptoms (LUTS)    RBBB (right bundle branch block)     Past Surgical History:  Procedure Laterality Date   CARDIOVASCULAR STRESS TEST  10/16/2012   normal myocardial perfusion study,  no ischemia/  not gated   COLONOSCOPY  last one 07-26-2013   CORONARY STENT INTERVENTION N/A 12/19/2017   Procedure: CORONARY STENT INTERVENTION;  Surgeon: Lennette Bihari, MD;  Location: [facility] INVASIVE CV [facility];  Service: Cardiovascular;  Laterality: N/A;   CYSTOSCOPY/RETROGRADE/URETEROSCOPY/STONE EXTRACTION WITH BASKET  1989   KNEE SURGERY Left 08/2021   LEFT HEART CATH AND CORONARY ANGIOGRAPHY N/A 12/19/2017   Procedure: LEFT HEART CATH AND CORONARY ANGIOGRAPHY;   Surgeon: Lennette Bihari, MD;  Location: [facility] INVASIVE CV LAB;  Service: Cardiovascular;  Laterality: N/A;  LAD    PROSTATE SURGERY  2002   PROSTATE SURGERY  2002   REFRACTIVE SURGERY Bilateral    SHOULDER ARTHROSCOPY WITH SUBACROMIAL DECOMPRESSION, ROTATOR CUFF REPAIR AND BICEP TENDON REPAIR Left 2013   SHOULDER SURGERY  09/2020   rotator cuff surgery   TONSILLECTOMY  ~ 1956   TRANSTHORACIC ECHOCARDIOGRAM  10/16/2012   normal LVF, ef 55-65%,  mild AV sclerosis without stenosis,  mild MR and TR,  mild LAE,  trivial PR   TRANSURETHRAL RESECTION OF BLADDER TUMOR WITH GYRUS (TURBT-GYRUS)  1989   and URETEROSCOPIC STONE EXTRACTION    Family History  Problem Relation Age of Onset   Breast cancer Mother    Thyroid disease Mother    Heart attack Father    Heart disease Father    Arthritis Father    Hypertension Father    Arthritis Sister    Stroke Maternal Grandmother    Lung cancer Maternal Grandfather    Other Paternal Grandmother        brain tumor   Aneurysm Paternal Grandfather    Colon cancer Neg Hx    Esophageal cancer Neg Hx    Rectal cancer Neg Hx    Stomach cancer Neg Hx    Liver cancer Neg Hx    Pancreatic cancer Neg Hx  Social History:  reports that he has never smoked. He quit smokeless tobacco use about 33 years ago.  His smokeless tobacco use included chew. He reports current alcohol use of about 2.0 standard drinks of alcohol per week. He reports that he does not currently use drugs.  Allergies: No Known Allergies  Prior to Admission medications   Medication Sig Start Date End Date Taking? Authorizing Provider  allopurinol (ZYLOPRIM) 100 MG tablet Take 100 mg by mouth daily.   Yes [provider]  aspirin 81 MG chewable tablet Chew 1 tablet (81 mg total) by mouth daily. 12/21/17  Yes Meng, Wynema Birch, PA  atorvastatin (LIPITOR) 40 MG tablet TAKE 1 TABLET BY MOUTH EVERY DAY 08/16/22  Yes Lennette Bihari, MD  clopidogrel (PLAVIX) 75 MG tablet TAKE 1 TABLET BY  MOUTH EVERY DAY 09/28/22  Yes Lennette Bihari, MD  Cyanocobalamin (VITAMIN B12) 1000 MCG TBCR Take 1 tablet by mouth daily.   Yes [provider]  isosorbide mononitrate (IMDUR) 30 MG 24 hr tablet TAKE 1/2 TABLET (15 MG TOTAL) BY MOUTH DAILY. 10/24/22  Yes Monge, Petra Kuba, NP  metoprolol tartrate (LOPRESSOR) 25 MG tablet TAKE 1/2 TABLET TWICE A DAY BY MOUTH 05/10/23  Yes Lennette Bihari, MD  polyethylene glycol (MIRALAX / GLYCOLAX) 17 g packet Take 34 g by mouth daily.   Yes [provider]  testosterone cypionate (DEPOTESTOTERONE CYPIONATE) 200 MG/ML injection Inject 200 mg into the muscle every 28 (twenty-eight) days.  12/04/12  Yes [provider]  bisacodyl 5 MG EC tablet Take 5 mg by mouth daily as needed for moderate constipation. Patient not taking: Reported on 07/04/2023    [provider]  ezetimibe (ZETIA) 10 MG tablet TAKE 1 TABLET BY MOUTH EVERY DAY Patient not taking: Reported on 07/04/2023 06/07/23   Lennette Bihari, MD  meclizine (ANTIVERT) 25 MG tablet Take 1 tablet (25 mg total) by mouth 2 (two) times daily as needed for dizziness. Patient not taking: Reported on 07/04/2023 06/07/23   [place], Alvira Philips, DO  nitroGLYCERIN (NITROSTAT) 0.4 MG SL tablet Place 1 tablet (0.4 mg total) under the tongue every 5 (five) minutes as needed for chest pain. 08/16/18 06/06/23  Lennette Bihari, MD  oxyCODONE (OXY IR/ROXICODONE) 5 MG immediate release tablet Take 5 mg by mouth every 4 (four) hours as needed. Patient not taking: Reported on 07/04/2023 04/05/23   [provider]  Prucalopride Succinate (MOTEGRITY) 2 MG TABS Take 1 tablet (2 mg total) by mouth daily. Patient not taking: Reported on 06/06/2023 03/31/23 03/25/24  Charlie Pitter III, MD    Height 5\' 9"  (1.753 m), weight 106.6 kg. Exam: General: Communicates without difficulty, well nourished, no acute distress. Head: Normocephalic, no evidence injury, no tenderness, facial buttresses intact without stepoff.  Face/sinus: No tenderness to palpation and percussion. Facial movement is normal and symmetric. Eyes: PERRL, EOMI. No scleral icterus, conjunctivae clear. Neuro: CN II exam reveals vision grossly intact.  No nystagmus at any point of gaze. Ears: Auricles well formed without lesions.  Ear canals are intact without mass or lesion.  No erythema or edema is appreciated.  The TMs are intact without fluid. Nose: External evaluation reveals normal support and skin without lesions.  Dorsum is intact.  Anterior rhinoscopy reveals congested mucosa over anterior aspect of inferior turbinates and intact septum.  No purulence noted. Oral:  Oral cavity and oropharynx are intact, symmetric, without erythema or edema.  Mucosa is moist without lesions. Neck: Full range  of motion without pain.  There is no significant lymphadenopathy.  No masses palpable.  Thyroid bed within normal limits to palpation.  Parotid glands and submandibular glands equal bilaterally without mass.  Trachea is midline. Neuro:  CN 2-12 grossly intact.  Vestibular: No nystagmus at any point of gaze. Dix Hallpike negative. Vestibular: There is no nystagmus with pneumatic pressure on either tympanic membrane or Valsalva. The cerebellar examination is unremarkable.   Assessment: 1.  The patient's initial episode of spinning vertigo may be secondary to vestibular neuronitis.  Currently he is off balance but without any spinning vertigo.  Other possible differential diagnoses include vestibular migraine, Mnire's disease, peripheral vestibular dysfunction, or other systemic/central causes. 2.  His MRI/CT scans were all negative. 3.  Bilateral symmetric high-frequency sensorineural hearing loss, likely secondary to routine presbycusis.  Plan: 1.  The physical exam findings and the hearing test results are reviewed with the patient. 2.  The pathophysiology of vestibular dysfunction and dizziness are discussed extensively with the patient. The possible  differential diagnoses are reviewed. Questions are invited and answered.  3.  The patient should continue with his vestibular therapy with his PT. 4.  If the patient's vertigo recurs, we will proceed with vestibular neurodiagnostic testing to evaluate for possible vestibular dysfunction. 5.  The patient is encouraged to call with any questions or concerns. I spent 45 minutes on the day of the encounter reviewing the patient's chart, seeing the patient, discussing the findings and the treatment plan, and documenting in the EHR.    Cortney Mckinney W Tanveer Dobberstein 07/05/2023, 11:09 AM

## 2023-07-07 NOTE — Telephone Encounter (Signed)
Returned patient call & he is still having loose stools 1-2 daily, states this has been going on for over a year now with only little improvements. He feels that he never has a complete bowel movement, feels bloated & has gas. Takes two scoops of Miralax daily with his coffee & ducolax daily (sometimes every other day). Previously had taken Linzess & Motegrity with no improvement, and Suprep for bowel purges which only relieves symptoms short term. He is requesting MD recommendations for more of a long term fix for his symptoms. Last seen for OV 11/2022 with Dr. Myrtie Neither.

## 2023-07-09 NOTE — Telephone Encounter (Signed)
This man has chronic intractable constipation for about a year now, the cause of which is unknown despite testing. Colonoscopy August 2023 (prior to this worsening of his constipation) notable for diverticulosis and a redundant and tortuous colon anatomy.  However he does not have a colonic stricture evident on colonoscopy.  CT abdomen and pelvis in March of last year unrevealing.  I have tried all available prescription treatments for this constipation that have either been limited by causing diarrhea or have been ineffective.  Empiric antibiotic for possible SIBO were also not helpful.  I am aware of how frustrated he must be by this persistent problem.  At this point, he has either some cause of constipation that I have not been able to elucidate or needs some treatment for it of which I am not aware.  I recommend he consider evaluation at an academic institution such as [facility], [place] or [place].  If he is agreeable to that, we will gladly send a referral.   - H Danis

## 2023-07-13 NOTE — Telephone Encounter (Signed)
Left message for patient to call back  

## 2023-07-13 NOTE — Telephone Encounter (Signed)
Spoke with patient regarding MD recommendations. He prefers we send referral to [facility]. Referral faxed. Advised him that he should hear from their office in 2-3 weeks for an appointment, but if not to give [place] a call back and we will check in on referral status. Pt verbalized all understanding.

## 2023-07-26 LAB — COMPREHENSIVE METABOLIC PANEL
ALT: 20 [IU]/L (ref 0–44)
AST: 17 [IU]/L (ref 0–40)
Albumin: 4.2 g/dL (ref 3.8–4.8)
Alkaline Phosphatase: 71 [IU]/L (ref 44–121)
BUN/Creatinine Ratio: 13 (ref 10–24)
BUN: 17 mg/dL (ref 8–27)
Bilirubin Total: 1.2 mg/dL (ref 0.0–1.2)
CO2: 26 mmol/L (ref 20–29)
Calcium: 8.8 mg/dL (ref 8.6–10.2)
Chloride: 104 mmol/L (ref 96–106)
Creatinine, Ser: 1.29 mg/dL — ABNORMAL HIGH (ref 0.76–1.27)
Globulin, Total: 2.5 g/dL (ref 1.5–4.5)
Glucose: 94 mg/dL (ref 70–99)
Potassium: 4.5 mmol/L (ref 3.5–5.2)
Sodium: 140 mmol/L (ref 134–144)
Total Protein: 6.7 g/dL (ref 6.0–8.5)
eGFR: 59 mL/min/{1.73_m2} — ABNORMAL LOW (ref >59)

## 2023-07-26 LAB — LIPID PANEL
Chol/HDL Ratio: 3.1 {ratio} (ref 0.0–5.0)
Cholesterol, Total: 113 mg/dL (ref 100–199)
HDL: 36 mg/dL — ABNORMAL LOW (ref >39)
LDL Chol Calc (NIH): 53 mg/dL (ref 0–99)
Triglycerides: 136 mg/dL (ref 0–149)
VLDL Cholesterol Cal: 24 mg/dL (ref 5–40)

## 2023-08-01 ENCOUNTER — Ambulatory Visit: Payer: PPO | Attending: Cardiovascular Disease | Admitting: Cardiovascular Disease

## 2023-08-01 ENCOUNTER — Encounter: Payer: Self-pay | Admitting: Cardiovascular Disease

## 2023-08-01 VITALS — BP 120/74 | HR 57 | Ht 69.5 in | Wt 245.0 lb

## 2023-08-01 DIAGNOSIS — R42 Dizziness and giddiness: Secondary | ICD-10-CM

## 2023-08-01 DIAGNOSIS — Z9861 Coronary angioplasty status: Secondary | ICD-10-CM

## 2023-08-01 DIAGNOSIS — I10 Essential (primary) hypertension: Secondary | ICD-10-CM | POA: Diagnosis not present

## 2023-08-01 DIAGNOSIS — E785 Hyperlipidemia, unspecified: Secondary | ICD-10-CM

## 2023-08-01 DIAGNOSIS — I25119 Atherosclerotic heart disease of native coronary artery with unspecified angina pectoris: Secondary | ICD-10-CM | POA: Diagnosis not present

## 2023-08-01 DIAGNOSIS — I440 Atrioventricular block, first degree: Secondary | ICD-10-CM

## 2023-08-01 DIAGNOSIS — I2510 Atherosclerotic heart disease of native coronary artery without angina pectoris: Secondary | ICD-10-CM

## 2023-08-01 DIAGNOSIS — Z8551 Personal history of malignant neoplasm of bladder: Secondary | ICD-10-CM

## 2023-08-01 DIAGNOSIS — I4510 Unspecified right bundle-branch block: Secondary | ICD-10-CM

## 2023-08-01 NOTE — Patient Instructions (Signed)
Medication Instructions:   No changes  *If you need a refill on your cardiac medications before your next appointment, please call your pharmacy*   Lab Work: Not needed    Testing/Procedures:  Not needed  Follow-Up: At [facility], you and your health needs are our priority.  As part of our continuing mission to provide you with exceptional heart care, we have created designated Provider Care Teams.  These Care Teams include your primary Cardiologist (physician) and Advanced Practice Providers (APPs -  Physician Assistants and Nurse Practitioners) who all work together to provide you with the care you need, when you need it.     Your next appointment:   12 month(s)  The format for your next appointment:   In Person  Provider:   Bryan Lemma MD

## 2023-08-01 NOTE — Progress Notes (Signed)
Cardiology Office Note    Date:  08/06/2023   ID:  Ryan Scott, Ryan Scott 07-29-1951, MRN [id number]  PCP:  Jackelyn Poling, DO  Cardiologist:  Nicki Guadalajara, MD   13 month F/U evaluation  History of Present Illness:  Ryan Scott is a 72 y.o. male who presents for a13 month follow-up cardiology evaluation.  Mr. Vance had remotely seen Dr. Alanda Amass.  He was seen in March 2019 by Joni Reining, NP referred by Dr. Barton Fanny after his CT of his chest showed significant coronary calcification.  Heis a former Scientist, water quality and he is felt to possibly have silicosis.  He had noticed more shortness of breath but denied any significant chest pain symptomatology.  With his symptoms and CT scan, he was referred for cardiac catheterization.  On December 19, 2017 cardiac catheterization performed by me showed low normal LV function with an EF of 50 to 55%.  There was significant coronary calcification 20 to 30% proximal LAD stenosis and then 80% stenosis after the first diagonal with 80% distal stenosis.  Circumflex had 60% focal OM1 stenosis prior to bifurcation and the RCA was significantly calcified with 50% stenosis in the proximal bend of the vessel and diffuse 70, 80, and 95% stenosis beyond the acute margin extending up to the takeoff of the PDA.  The PDA and PLA were collateralized via the distal circumflex.  He underwent a difficult but successful PCI to the calcified LAD with ultimate insertion of a 3.0 x 16 mm Synergy DES stent to the proximal lesion PTCA of the distal LAD.  Subsequently, he was on aspirin and Brilinta and had been on isosorbide 30 mg in addition to metoprolol 12.5 mg twice a day.  He also has been on Zetia 10 mg in addition to Livalo reportedly at 1 mg daily.  He has experienced shortness of breath really when he lies down.  He denies any significant volume issues.  He has noticed some slight improvement with dyspnea following coffee.  I saw him for initial evaluation  following his hospitalization in July 2019.  Due to potential Brilinta induced dyspnea he was switched to clopidogrel 75 mg daily.  Subsequent P2 Y12 testing adjusted possible hyper responsiveness at 7 PR U.  Subsequently, his shortness of breath has improved.  I saw him in November 2019.  He was unaware of any excess bleeding.  He continues to be active and is able to do anything he can without chest pain or shortness of breath.  He underwent went repeat laboratory which showed a total cholesterol of 111 triglycerides 74 HDL 41 and LDL cholesterol 55 while on combination Zetia and Livalo.  He was concerned about the cost of Livalo and since he was unaware of any issues with other statins I recommended changing to atorvastatin.   When I saw him in August 2020 he remained stable since his prior evaluation and denied  recurrent chest pain or anginal symptomatology.  He had initially lost about 25 pounds following his coronary event but unfortunately due to the to the Covid pandemic began to develop increasing weight.    I saw him in September 2021.  Since his prior evaluation he continued to do well and was without chest pain or shortness of breath.   He suffered a stress fracture in his left foot and has been in the boot for the past 4 to 5 weeks.  He admits to further weight gain.    I  saw him on May 27, 2021.  Since his prior evaluation he underwent successful right shoulder surgery by Dr. Dion Saucier in January 2022.  He was given preoperative clearance and had held his Plavix for 5 days prior to the surgery and tolerated surgery well.  He also has remote history of bladder cancer and undergoes every 5-year CT imaging and cystoscopy.  Most recently he has been walking 2-1/2 miles per day 6 days/week.  Over this weekend he tripped over his cat and sprained his knee and for the last several days has not been walking.  He is unaware of any palpitations.  He has not been successful with recent weight loss.     I last saw him on June 28, 2022 at which time he remained stable and denied any angina or change in shortness of breath with exercise or activity.  He underwent knee surgery in December 2022.  He recently had undergone colonoscopy by Dr. Myrtie Neither and had 8 polyps removed.  He continues to see Dr. Mena Goes for urology in light of his history of bladder cancer with a large prostate.  At times he admits to some mild left foot numbness.  He continues to be on DAPT with aspirin/Plavix.  He is on low-dose metoprolol tartrate 12.5 mg states he takes this at night, and also is on low-dose isosorbide 15 mg.  He continues to be on Zetia and atorvastatin 40 mg for hyperlipidemia.  He sees Dr. Barton Fanny for primary care.  LDL cholesterol in June 21, 2022 was 61.    Since I last saw him, he underwent successful right hip replacement surgery in August 2020.  He was evaluated in the ER for vertigo in September 2024.  He has had issues with constipation and bloating for which she has been seen by Dr. Myrtie Neither and will be referred to [facility].  He denies chest pain or shortness of breath.  He is unaware of palpitations.  He continues to be on DAPT with aspirin/Plavix.  He is on isosorbide 15 mg, metoprolol tartrate 12.5 mg twice a day.  He is on atorvastatin 40 mg daily for hyperlipidemia.  He has a testosterone injection monthly.  He presents for evaluation.   Past Medical History:  Diagnosis Date   Arthritis    "back, neck, shoulders" (12/19/2017)   Bladder cancer (HCC)    Borderline hypertension    BPH (benign prostatic hyperplasia)    Chronic lower back pain    ED (erectile dysfunction)    High cholesterol    "started BP RX 12/18/2017"   History of adenomatous polyp of colon    tubular adenoma   History of kidney stones    Hypertension    Hypogonadism male    Left ureteral stone    Lower urinary tract symptoms (LUTS)    RBBB (right bundle branch block)     Past Surgical History:  Procedure  Laterality Date   CARDIOVASCULAR STRESS TEST  10/16/2012   normal myocardial perfusion study,  no ischemia/  not gated   COLONOSCOPY  last one 07-26-2013   CORONARY STENT INTERVENTION N/A 12/19/2017   Procedure: CORONARY STENT INTERVENTION;  Surgeon: Lennette Bihari, MD;  Location: [facility] INVASIVE CV LAB;  Service: Cardiovascular;  Laterality: N/A;   CYSTOSCOPY/RETROGRADE/URETEROSCOPY/STONE EXTRACTION WITH BASKET  1989   KNEE SURGERY Left 08/2021   LEFT HEART CATH AND CORONARY ANGIOGRAPHY N/A 12/19/2017   Procedure: LEFT HEART CATH AND CORONARY ANGIOGRAPHY;  Surgeon: Lennette Bihari, MD;  Location: [facility] INVASIVE  CV LAB;  Service: Cardiovascular;  Laterality: N/A;  LAD    PROSTATE SURGERY  2002   PROSTATE SURGERY  2002   REFRACTIVE SURGERY Bilateral    SHOULDER ARTHROSCOPY WITH SUBACROMIAL DECOMPRESSION, ROTATOR CUFF REPAIR AND BICEP TENDON REPAIR Left 2013   SHOULDER SURGERY  09/2020   rotator cuff surgery   TONSILLECTOMY  ~ 1956   TRANSTHORACIC ECHOCARDIOGRAM  10/16/2012   normal LVF, ef 55-65%,  mild AV sclerosis without stenosis,  mild MR and TR,  mild LAE,  trivial PR   TRANSURETHRAL RESECTION OF BLADDER TUMOR WITH GYRUS (TURBT-GYRUS)  1989   and URETEROSCOPIC STONE EXTRACTION    Current Medications: Outpatient Medications Prior to Visit  Medication Sig Dispense Refill   allopurinol (ZYLOPRIM) 100 MG tablet Take 100 mg by mouth daily.     aspirin 81 MG chewable tablet Chew 1 tablet (81 mg total) by mouth daily.     atorvastatin (LIPITOR) 40 MG tablet TAKE 1 TABLET BY MOUTH EVERY DAY 90 tablet 3   bisacodyl 5 MG EC tablet Take 5 mg by mouth daily as needed for moderate constipation.     clopidogrel (PLAVIX) 75 MG tablet TAKE 1 TABLET BY MOUTH EVERY DAY 90 tablet 3   Cyanocobalamin (VITAMIN B12) 1000 MCG TBCR Take 1 tablet by mouth daily.     ezetimibe (ZETIA) 10 MG tablet TAKE 1 TABLET BY MOUTH EVERY DAY 90 tablet 0   isosorbide mononitrate (IMDUR) 30 MG 24 hr tablet TAKE 1/2 TABLET  (15 MG TOTAL) BY MOUTH DAILY. 45 tablet 3   polyethylene glycol (MIRALAX / GLYCOLAX) 17 g packet Take 34 g by mouth daily.     testosterone cypionate (DEPOTESTOTERONE CYPIONATE) 200 MG/ML injection Inject 200 mg into the muscle every 28 (twenty-eight) days.      metoprolol tartrate (LOPRESSOR) 25 MG tablet TAKE 1/2 TABLET TWICE A DAY BY MOUTH 90 tablet 0   nitroGLYCERIN (NITROSTAT) 0.4 MG SL tablet Place 1 tablet (0.4 mg total) under the tongue every 5 (five) minutes as needed for chest pain. 25 tablet 3   meclizine (ANTIVERT) 25 MG tablet Take 1 tablet (25 mg total) by mouth 2 (two) times daily as needed for dizziness. (Patient not taking: Reported on 07/04/2023) 30 tablet 1   oxyCODONE (OXY IR/ROXICODONE) 5 MG immediate release tablet Take 5 mg by mouth every 4 (four) hours as needed. (Patient not taking: Reported on 07/04/2023)     Prucalopride Succinate (MOTEGRITY) 2 MG TABS Take 1 tablet (2 mg total) by mouth daily. (Patient not taking: Reported on 06/06/2023) 90 tablet 3   No facility-administered medications prior to visit.     Allergies:   Patient has no known allergies.   Social History   Socioeconomic History   Marital status: Married    Spouse name: Not on file   Number of children: Not on file   Years of education: Not on file   Highest education level: Not on file  Occupational History   Not on file  Tobacco Use   Smoking status: Never   Smokeless tobacco: Former    Types: Chew    Quit date: 1991  Vaping Use   Vaping status: Never Used  Substance and Sexual Activity   Alcohol use: Yes    Alcohol/week: 2.0 standard drinks of alcohol    Types: 2 Standard drinks or equivalent per week   Drug use: Not Currently   Sexual activity: Not on file  Other Topics Concern   Not on file  Social History Narrative   Not on file   Social Determinants of Health   Financial Resource Strain: Not on file  Food Insecurity: No Food Insecurity (06/06/2023)   Hunger Vital Sign     Worried About Running Out of Food in the Last Year: Never true    Ran Out of Food in the Last Year: Never true  Transportation Needs: No Transportation Needs (06/06/2023)   PRAPARE - Administrator, Civil Service (Medical): No    Lack of Transportation (Non-Medical): No  Physical Activity: Not on file  Stress: Not on file  Social Connections: Not on file     Family History:  The patient's family history includes Aneurysm in his paternal grandfather; Arthritis in his father and sister; Breast cancer in his mother; Heart attack in his father; Heart disease in his father; Hypertension in his father; Lung cancer in his maternal grandfather; Other in his paternal grandmother; Stroke in his maternal grandmother; Thyroid disease in his mother.   ROS General: Negative; No fevers, chills, or night sweats;  HEENT: Negative; No changes in vision or hearing, sinus congestion, difficulty swallowing Pulmonary: Mild shortness of breath.  Possible silicosis, not definitively diagnosed Cardiovascular: Negative; No chest pain, presyncope, syncope, palpitations GI: Recent colonoscopy status post removal of 8 polyps; constipation and bloating GU: History of bladder cancer, undergoing every 5-year CT imaging and cystoscopy. Musculoskeletal: Left foot fracture he was in a boot for 4 months at the end of 2021.  Knee surgery December 2022.  Status post right rotator cuff surgery January 2022.  Right hip replacement August 2024 Hematologic/Oncology: Negative; no easy bruising, bleeding Endocrine: Negative; no heat/cold intolerance; no diabetes Neuro: Negative; no changes in balance, headaches Skin: Negative; No rashes or skin lesions Psychiatric: Negative; No behavioral problems, depression Sleep: Negative; No snoring, daytime sleepiness, hypersomnolence, bruxism, restless legs, hypnogognic hallucinations, no cataplexy Other comprehensive 14 point system review is negative.   PHYSICAL EXAM:   VS:  BP  120/74 (BP Location: Left Arm, Patient Position: Sitting, Cuff Size: Large)   Pulse (!) 57   Ht 5' 9.5" (1.765 m)   Wt 245 lb (111.1 kg)   BMI 35.66 kg/m     Repeat blood pressure by me 120/72  Wt Readings from Last 3 Encounters:  08/01/23 245 lb (111.1 kg)  07/04/23 235 lb (106.6 kg)  06/06/23 230 lb 2.6 oz (104.4 kg)   General: Alert, oriented, no distress.  Skin: normal turgor, no rashes, warm and dry HEENT: Normocephalic, atraumatic. Pupils equal round and reactive to light; sclera anicteric; extraocular muscles intact;  Nose without nasal septal hypertrophy Mouth/Parynx benign; Mallinpatti scale 3 Neck: No JVD, no carotid bruits; normal carotid upstroke Lungs: clear to ausculatation and percussion; no wheezing or rales Chest wall: without tenderness to palpitation Heart: PMI not displaced, RRR, s1 s2 normal, 1/6 systolic murmur, no diastolic murmur, no rubs, gallops, thrills, or heaves Abdomen: soft, nontender; no hepatosplenomehaly, BS+; abdominal aorta nontender and not dilated by palpation. Back: no CVA tenderness Pulses 2+ Musculoskeletal: full range of motion, normal strength, no joint deformities Extremities: no clubbing cyanosis or edema, Homan's sign negative  Neurologic: grossly nonfocal; Cranial nerves grossly wnl Psychologic: Normal mood and affect    Studies/Labs Reviewed:   EKG Interpretation Date/Time:  Tuesday August 01 2023 11:24:15 EST Ventricular Rate:  57 PR Interval:  212 QRS Duration:  112 QT Interval:  414 QTC Calculation: 402 R Axis:   -27  Text Interpretation: Sinus bradycardia with 1st degree  A-V block Incomplete right bundle branch block When compared with ECG of 06-Jun-2023 06:41, PREVIOUS ECG IS PRESENT Confirmed by Nicki Guadalajara ([id number]) on 08/01/2023 11:26:39 AM    June 28, 2022 ECG (independently read by me): Sinus bradycardia at 57, 1st degree AV block, PR 212 msec  May 27, 2021 ECG (independently read by me): Sinus  bradycardia 56 bpm, first-degree block with PR 212 msec.  Borderline RBBB, QRS 120 msec  June 19, 2020 ECG (independently read by me): Sinus rhythm with frequent PACs, right bundle branch block borderline first-degree block with PR interval 202  ms.  QS complex in V3.   August 2020 ECG (independently read by me): Sinus bradycardia 56 bpm, incomplete right bundle branch block.  Borderline first-degree AV block with a PR interval 2 8 ms.  Small inferior Q waves  November 2019 ECG (independently read by me): Sinus bradycardia 52 bpm.  First-degree AV block.  Right bundle branch block.  July 2019 ECG (independently read by me): Sinus bradycardia at 50 bpm.  Mild first-degree AV block with a PR interval of 218 ms.  Incomplete right bundle branch block.  Recent Labs:    Latest Ref Rng & Units 07/25/2023    9:20 AM 06/07/2023    6:07 AM 06/05/2023    8:40 PM  BMP  Glucose 70 - 99 mg/dL 94  84  604   BUN 8 - 27 mg/dL 17  8  15    Creatinine 0.76 - 1.27 mg/dL 5.40  9.81  1.91   BUN/Creat Ratio 10 - 24 13     Sodium 134 - 144 mmol/L 140  139  134   Potassium 3.5 - 5.2 mmol/L 4.5  3.5  3.8   Chloride 96 - 106 mmol/L 104  105  102   CO2 20 - 29 mmol/L 26  23  21    Calcium 8.6 - 10.2 mg/dL 8.8  8.6  8.9         Latest Ref Rng & Units 07/25/2023    9:20 AM 09/22/2022    9:46 AM 06/21/2022    9:58 AM  Hepatic Function  Total Protein 6.0 - 8.5 g/dL 6.7  7.0  6.7   Albumin 3.8 - 4.8 g/dL 4.2  4.2  4.3   AST 0 - 40 IU/L 17  23  22    ALT 0 - 44 IU/L 20  26  25    Alk Phosphatase 44 - 121 IU/L 71  48  59   Total Bilirubin 0.0 - 1.2 mg/dL 1.2  1.7  1.6   Bilirubin, Direct 0.0 - 0.3 mg/dL  0.3         Latest Ref Rng & Units 06/07/2023    6:07 AM 06/05/2023    8:40 PM 09/22/2022    9:46 AM  CBC  WBC 4.0 - 10.5 K/uL 10.4  9.9  6.7   Hemoglobin 13.0 - 17.0 g/dL 47.8  29.5  62.1   Hematocrit 39.0 - 52.0 % 43.0  46.7  48.8   Platelets 150 - 400 K/uL 297  304  188.0    Lab Results  Component  Value Date   MCV 89.8 06/07/2023   MCV 87.8 06/05/2023   MCV 89.6 09/22/2022   Lab Results  Component Value Date   TSH 1.238 06/06/2023   No results found for: "HGBA1C"   BNP No results found for: "BNP"  ProBNP No results found for: "PROBNP"   Lipid Panel     Component Value Date/Time  CHOL 113 07/25/2023 0920   TRIG 136 07/25/2023 0920   HDL 36 (L) 07/25/2023 0920   CHOLHDL 3.1 07/25/2023 0920   LDLCALC 53 07/25/2023 0920     RADIOLOGY: No results found.   Additional studies/ records that were reviewed today include:   CT CHEST: IMPRESSION: 1. Two right middle lobe pulmonary nodules are unchanged since 2016 and benign. No additional imaging follow-up is necessary. 2. No acute pulmonary findings. Mild bilateral middle lobe and lower lobe scarring/atelectasis. 3. Extensive coronary artery calcified atherosclerosis and/or stents.  ------------------------------------------------------------------------------------------------------  December 19, 2017 Cardiac Catheterization/PCI:  Low normal global LV function with an EF of 50-55% without definitive focal segmental wall motion abnormalities.   Significant coronary calcification with vessel CAD.  The LAD had proximal 20-30% stenosis and a calcified segment and then following a sharp and had 80% stenosis after the first diagonal vessel and 80% distal stenosis;  60% focal OM1 stenosis prior to a bifurcation; calcified RCA with possible 50% stenosis on a proximal bend in the vessel.  There is diffuse 70-80-95% stenoses  beyond the acute margin extending up to the takeoff of the PDA.  There is collateralization of the PDA and PLA vessels via the distal circumflex.   Difficult but successful PCI to the calcified LAD with ultimate insertion of a 3.016 mm Synergy DES stent to the proximal lesion with the 80% stenosis being reduced to 0%, and PTCA of the distal LAD lesion with the 80% stenosis being reduced to less than 5%  utilizing bivalirudin and aspirin/Brilinta.   RECOMMENDATION: DAPT for a mininum of one year. Following this initial procedure he will be started on anti-ischemic medical therapy including beta blocker and nitrates.  Will review angiograms with colleagues.  The distal RCA is collateralized.  Consider attempt at initial medical therapy of the diffusely diseased distal calcified RCA versus staged  PCI.    ASSESSMENT:    1. Coronary artery disease involving native coronary artery of native heart [facility]): stable on medical therapy.   2. CAD S/P percutaneous coronary angioplasty: December 19, 2017 with DES stent to proximal LAD and PTCA of distal LAD, on medical therapy for concomitant CAD   3. Essential hypertension   4. First degree AV block   5. Hyperlipidemia with target LDL less than 70   6. History of bladder cancer   7. Incomplete right bundle branch block     PLAN:  Mr. Anrew Buchko is a 72 year-old gentleman who has a history of mild hypertension, hyperlipidemia, and has documented stable pulmonary nodules and extensive coronary calcification.  He was found to have multivessel CAD and underwent successful stenting of a calcified proximal LAD stenosis with PTCA of the distal LAD stenosis in March 2019.  At that time he had diffuse disease in his RCA which was significantly narrowed distally but there was good collateralization to the distal RCA from the circumflex territory.  Since his coronary intervention he has done well.  Presently, he has been without recurrent anginal symptomatology.  He developed dyspnea related to Brilinta and has continued to be on DAPT with aspirin/clopidogrel.  I performed a P2Y12 test which suggested possible hyperresponsiveness with the *17 allele.  Over the past several years he has undergone right shoulder surgery, subsequent knee surgery and more recently right hip replacement.  He was found to have 8 polyps on his last colonoscopy.  His blood pressure has been  stable on his current therapy with low-dose metoprolol and isosorbide and he remains without  anginal symptomatology.  He continues to be on atorvastatin 40 mg and Zetia 10 mg for hyperlipidemia.  Most recent LDL cholesterol in July 25, 2023 was excellent at 53.  Total cholesterol is 113 with triglycerides 136.  Creatinine was minimally increased at 1.29.  Clinically he is remaining cardiovascularly stable.  He is followed by Dr. Mena Goes of urology with his history of bladder CVA and prostatic hypertrophy.  I discussed with him my plans for retirement in mid 2025.  Clinically he is stable.  I have recommended a 1 year follow-up with cardiology and we will transition him to the care of Dr. Herbie Baltimore.    Medication Adjustments/Labs and Tests Ordered: Current medicines are reviewed at length with the patient today.  Concerns regarding medicines are outlined above.  Medication changes, Labs and Tests ordered today are listed in the Patient Instructions below. Patient Instructions  Medication Instructions:   No changes  *If you need a refill on your cardiac medications before your next appointment, please call your pharmacy*   Lab Work: Not needed    Testing/Procedures:  Not needed  Follow-Up: At [facility], you and your health needs are our priority.  As part of our continuing mission to provide you with exceptional heart care, we have created designated Provider Care Teams.  These Care Teams include your primary Cardiologist (physician) and Advanced Practice Providers (APPs -  Physician Assistants and Nurse Practitioners) who all work together to provide you with the care you need, when you need it.     Your next appointment:   12 month(s)  The format for your next appointment:   In Person  Provider:   Bryan Lemma MD      Signed, Nicki Guadalajara, MD  08/06/2023 3:37 PM    [facility] [address], [place], [place]  [address] Phone:  [phone]

## 2023-08-02 ENCOUNTER — Other Ambulatory Visit: Payer: Self-pay | Admitting: Cardiovascular Disease

## 2023-08-02 NOTE — Telephone Encounter (Signed)
Patient stopped by office stating he has not heard from [facility] regarding his referral. Please advise.

## 2023-08-02 NOTE — Telephone Encounter (Signed)
Spoke with [facility] & they stated they were still working on October referrals. Takes at least 4-6 weeks now. Pt advised that he should hear something in the next couple of weeks (since it was sent in early October).

## 2023-08-06 ENCOUNTER — Encounter: Payer: Self-pay | Admitting: Cardiovascular Disease

## 2023-09-11 NOTE — Telephone Encounter (Signed)
I received a fax from the [facility] stating that they are not accepting second opinion referrals at this time.  This gentleman has had over a year of persistent constipation despite multiple treatments we have tried.  If he would like to return to see me, I am glad to do that on the next available appointment.  I believe we have a more recently-released medication for constipation that he has not tried yet.  If we have any samples of it, he can give a try to Ibsrela 50 mg, 1 tablet twice daily.  7 to 10 days of samples if we have them.  Ellwood Dense MD

## 2023-09-13 ENCOUNTER — Telehealth: Payer: Self-pay

## 2023-09-13 NOTE — Telephone Encounter (Signed)
Received fax from [facility] stating they are not accepting second opinions referrals at this time- therefor not offering patient a appointment.

## 2023-09-14 NOTE — Telephone Encounter (Signed)
Spoke with patient in regards to the [facility] referral being denied & Dr. Irving Burton recommendations. He has asked that we send in a referral to [facility], and while we wait to see if it's accepted then he would like to see Dr. Myrtie Neither for a follow up. OV scheduled for 11/28/23 at 2:00 pm. Samples for Hall Busing has also been placed at the 2nd floor front desk for pt to pick up. He will be by here next week & advised to update [place] once completed samples. Pt verbalized all understanding.

## 2023-09-29 ENCOUNTER — Encounter: Payer: Self-pay | Admitting: Gastroenterology

## 2023-10-02 NOTE — Telephone Encounter (Signed)
 This man has been suffering from constipation for well over a year now and has tried multiple medicines without relief.  I did empirically treat him for the possibility of SIBO with metronidazole  but he had no improvement.  I had done that because rifaximin is typically cost prohibitive and not covered by insurance.  However, rifaximin would be a first-line and better empiric treatment for that possible diagnosis.  As it happens, I have set aside some samples of this medicine.  If he is agreeable to it, I have this one last thing to recommend prior to him getting an evaluation at an academic center.  If he wants to do it, he can be given the samples of rifaximin 550 mg tablets, 1 tablet twice daily for 14 days.  There is a supply of those medicines in my office and I believe it is enough to last that entire course or perhaps it is just 1 or 2 doses short.  VEAR Brand MD

## 2023-10-24 ENCOUNTER — Encounter: Payer: Self-pay | Admitting: Gastroenterology

## 2023-11-01 ENCOUNTER — Other Ambulatory Visit: Payer: Self-pay | Admitting: Cardiovascular Disease

## 2023-11-11 ENCOUNTER — Other Ambulatory Visit: Payer: Self-pay | Admitting: Cardiovascular Disease

## 2023-11-28 ENCOUNTER — Ambulatory Visit: Payer: PPO | Admitting: Gastroenterology

## 2023-11-28 ENCOUNTER — Encounter: Payer: Self-pay | Admitting: Gastroenterology

## 2023-11-28 VITALS — BP 130/80 | HR 65 | Ht 69.0 in | Wt 249.0 lb

## 2023-11-28 DIAGNOSIS — R140 Abdominal distension (gaseous): Secondary | ICD-10-CM

## 2023-11-28 DIAGNOSIS — K5909 Other constipation: Secondary | ICD-10-CM

## 2023-11-28 DIAGNOSIS — R1084 Generalized abdominal pain: Secondary | ICD-10-CM

## 2023-11-28 NOTE — Patient Instructions (Signed)
 You have been scheduled for a CT scan of the abdomen and pelvis at [facility], 1st floor Radiology. You are scheduled on         You should arrive 15 minutes prior to your appointment time for registration.   Please follow the written instructions below on the day of your exam:   1) Do not eat anything after   4 hours prior to your test     You may take any medications as prescribed with a small amount of water, if necessary. If you take any of the following medications: METFORMIN, GLUCOPHAGE, GLUCOVANCE, AVANDAMET, RIOMET, FORTAMET, ACTOPLUS MET, JANUMET, GLUMETZA or METAGLIP, you MAY be asked to HOLD this medication 48 hours AFTER the exam.   The purpose of you drinking the oral contrast is to aid in the visualization of your intestinal tract. The contrast solution may cause some diarrhea. Depending on your individual set of symptoms, you may also receive an intravenous injection of x-ray contrast/dye. Plan on being at [facility] for 45 minutes or longer, depending on the type of exam you are having performed.   If you have any questions regarding your exam or if you need to reschedule, you may call [facility] at [phone] between the hours of 8:00 am and 5:00 pm, Monday-Friday.    _______________________________________________________  If your blood pressure at your visit was 140/90 or greater, please contact your primary care physician to follow up on this.  _______________________________________________________  If you are age 69 or older, your body mass index should be between 23-30. Your Body mass index is 36.77 kg/m. If this is out of the aforementioned range listed, please consider follow up with your Primary Care Provider.  If you are age 6 or younger, your body mass index should be between 19-25. Your Body mass index is 36.77 kg/m. If this is out of the aformentioned range listed, please consider follow up with your Primary Care Provider.    ________________________________________________________  The [facility] providers would like to encourage you to use [facility] to communicate with providers for non-urgent requests or questions.  Due to long hold times on the telephone, sending your provider a message by [facility] may be a faster and more efficient way to get a response.  Please allow 48 business hours for a response.  Please remember that this is for non-urgent requests.  _______________________________________________________ It was a pleasure to see you today!  Thank you for trusting me with your gastrointestinal care!

## 2023-11-28 NOTE — Progress Notes (Signed)
 [facility] progress note:  History: Ryan Scott 11/28/2023  Referring provider: Jackelyn Poling, DO  Reason for consult/chief complaint: Constipation (Constipation hasn't gotten better, also have abd bloating )   Subjective  Prior history: I saw Ryan Scott for a surveillance colonoscopy August 2023, removed 7 tubular adenomas and sessile serrated polyps.  Redundant colon, challenging scope passage.   He was seen by our APP December 20 for persistent alteration of bowel habits that began acutely with diarrhea and bloating in mid to late October 2023.  It initially started with diarrhea with no preceding antibiotic use or other change in medicines.  Different OTC remedies were tried, and he became constipated more bloated.  He went back and forth with those treatments for a while.  Overall it sounded most like some postinfectious syndrome, though it was difficult to tell whether his predominant bowel habit was constipation or diarrhea.  Fecal lactoferrin positive, C. difficile negative. Trial of Entocort and dicyclomine prescribed as empiric therapy for possible postinfectious microscopic colitis -no improvement, may have worsened constipation. CT abdomen and pelvis March 2024 without evidence of colonic dilatation or obstruction. Seen by Dr. Myrtie Neither in clinic March 2024.  Every available prescription treatment for constipation has not helped or was limited by side effects (typically causing explosive loose stool): Linzess, Amitiza, Motegrity, Ibsrela.  No improvement with empiric treatment trials for SIBO including ciprofloxacin and metronidazole earlier in 2024 and then rifaximin in the fall 2024. He was then referred for another opinion and he wished to see providers at [facility].  They were not accepting second opinions at that time, and patient currently has an appointment scheduled with [facility] in July of this year. _____________________   Ryan Scott continues to struggle  constipation for which she needs to take Dulcolax as well as twice daily MiraLAX.  He puts the MiraLAX in his coffee, and afterward he has on average 2 loose bowel movements.  With that he still feels incompletely evacuated and has bloating and distention, all of which she feels has increased in recent months.  He has not had any rectal bleeding along the way it still does not now.  Denies vomiting or weight loss.  He recovered from a hip surgery, and also notes that when he tried the samples of Motegrity it was around that same time.  So he wonders if it may not have worked as well as it could have because of pain meds, decreased mobility and other perioperative issues.  He was interested to try Motegrity again.   ROS:  Review of Systems  Constitutional:  Negative for appetite change and unexpected weight change.  HENT:  Negative for mouth sores and voice change.   Eyes:  Negative for pain and redness.  Respiratory:  Negative for cough and shortness of breath.   Cardiovascular:  Negative for chest pain and palpitations.  Genitourinary:  Negative for dysuria and hematuria.  Musculoskeletal:  Positive for arthralgias. Negative for myalgias.  Skin:  Negative for pallor and rash.  Neurological:  Negative for weakness and headaches.  Hematological:  Negative for adenopathy.     Past Medical History: Past Medical History:  Diagnosis Date   Arthritis    "back, neck, shoulders" (12/19/2017)   Bladder cancer (HCC)    Borderline hypertension    BPH (benign prostatic hyperplasia)    Chronic lower back pain    ED (erectile dysfunction)    High cholesterol    "started BP RX 12/18/2017"   History of  adenomatous polyp of colon    tubular adenoma   History of kidney stones    Hypertension    Hypogonadism male    Left ureteral stone    Lower urinary tract symptoms (LUTS)    RBBB (right bundle branch block)      Past Surgical History: Past Surgical History:  Procedure Laterality Date    CARDIOVASCULAR STRESS TEST  10/16/2012   normal myocardial perfusion study,  no ischemia/  not gated   COLONOSCOPY  last one 07-26-2013   CORONARY STENT INTERVENTION N/A 12/19/2017   Procedure: CORONARY STENT INTERVENTION;  Surgeon: Lennette Bihari, MD;  Location: [facility] INVASIVE CV LAB;  Service: Cardiovascular;  Laterality: N/A;   CYSTOSCOPY/RETROGRADE/URETEROSCOPY/STONE EXTRACTION WITH BASKET  1989   KNEE SURGERY Left 08/2021   LEFT HEART CATH AND CORONARY ANGIOGRAPHY N/A 12/19/2017   Procedure: LEFT HEART CATH AND CORONARY ANGIOGRAPHY;  Surgeon: Lennette Bihari, MD;  Location: [facility] INVASIVE CV LAB;  Service: Cardiovascular;  Laterality: N/A;  LAD    PROSTATE SURGERY  2002   PROSTATE SURGERY  2002   REFRACTIVE SURGERY Bilateral    SHOULDER ARTHROSCOPY WITH SUBACROMIAL DECOMPRESSION, ROTATOR CUFF REPAIR AND BICEP TENDON REPAIR Left 2013   SHOULDER SURGERY  09/2020   rotator cuff surgery   TONSILLECTOMY  ~ 1956   TRANSTHORACIC ECHOCARDIOGRAM  10/16/2012   normal LVF, ef 55-65%,  mild AV sclerosis without stenosis,  mild MR and TR,  mild LAE,  trivial PR   TRANSURETHRAL RESECTION OF BLADDER TUMOR WITH GYRUS (TURBT-GYRUS)  1989   and URETEROSCOPIC STONE EXTRACTION     Family History: Family History  Problem Relation Age of Onset   Breast cancer Mother    Thyroid disease Mother    Heart attack Father    Heart disease Father    Arthritis Father    Hypertension Father    Arthritis Sister    Stroke Maternal Grandmother    Lung cancer Maternal Grandfather    Other Paternal Grandmother        brain tumor   Aneurysm Paternal Grandfather    Colon cancer Neg Hx    Esophageal cancer Neg Hx    Rectal cancer Neg Hx    Stomach cancer Neg Hx    Liver cancer Neg Hx    Pancreatic cancer Neg Hx     Social History: Social History   Socioeconomic History   Marital status: Married    Spouse name: Not on file   Number of children: Not on file   Years of education: Not on file   Highest  education level: Not on file  Occupational History   Not on file  Tobacco Use   Smoking status: Never   Smokeless tobacco: Former    Types: Chew    Quit date: 1991  Vaping Use   Vaping status: Never Used  Substance and Sexual Activity   Alcohol use: Yes    Alcohol/week: 2.0 standard drinks of alcohol    Types: 2 Standard drinks or equivalent per week   Drug use: Not Currently   Sexual activity: Not on file  Other Topics Concern   Not on file  Social History Narrative   Not on file   Social Drivers of Health   Financial Resource Strain: Not on file  Food Insecurity: No Food Insecurity (06/06/2023)   Hunger Vital Sign    Worried About Running Out of Food in the Last Year: Never true    Ran Out of Food in the  Last Year: Never true  Transportation Needs: No Transportation Needs (06/06/2023)   PRAPARE - Administrator, Civil Service (Medical): No    Lack of Transportation (Non-Medical): No  Physical Activity: Not on file  Stress: Not on file  Social Connections: Not on file    Allergies: No Known Allergies  Outpatient Meds: Current Outpatient Medications  Medication Sig Dispense Refill   allopurinol (ZYLOPRIM) 100 MG tablet Take 100 mg by mouth daily.     aspirin 81 MG chewable tablet Chew 1 tablet (81 mg total) by mouth daily.     atorvastatin (LIPITOR) 40 MG tablet TAKE 1 TABLET BY MOUTH EVERY DAY 90 tablet 3   bisacodyl 5 MG EC tablet Take 5 mg by mouth daily as needed for moderate constipation.     clopidogrel (PLAVIX) 75 MG tablet TAKE 1 TABLET BY MOUTH EVERY DAY 90 tablet 3   Cyanocobalamin (VITAMIN B12) 1000 MCG TBCR Take 1 tablet by mouth daily.     ezetimibe (ZETIA) 10 MG tablet TAKE 1 TABLET BY MOUTH EVERY DAY 90 tablet 0   isosorbide mononitrate (IMDUR) 30 MG 24 hr tablet TAKE 1/2 TABLET (15 MG TOTAL) BY MOUTH DAILY. 45 tablet 3   metoprolol tartrate (LOPRESSOR) 25 MG tablet TAKE 1/2 TABLET TWICE A DAY BY MOUTH 90 tablet 3   polyethylene glycol  (MIRALAX / GLYCOLAX) 17 g packet Take 34 g by mouth daily.     testosterone cypionate (DEPOTESTOTERONE CYPIONATE) 200 MG/ML injection Inject 200 mg into the muscle every 28 (twenty-eight) days.      nitroGLYCERIN (NITROSTAT) 0.4 MG SL tablet Place 1 tablet (0.4 mg total) under the tongue every 5 (five) minutes as needed for chest pain. 25 tablet 3   No current facility-administered medications for this visit.      ___________________________________________________________________ Objective   Exam:  BP 130/80   Pulse 65   Ht 5\' 9"  (1.753 m)   Wt 249 lb (112.9 kg)   BMI 36.77 kg/m  Wt Readings from Last 3 Encounters:  11/28/23 249 lb (112.9 kg)  08/01/23 245 lb (111.1 kg)  07/04/23 235 lb (106.6 kg)    General: Well-appearing Eyes: sclera anicteric, no redness ENT: oral mucosa moist without lesions, no cervical or supraclavicular lymphadenopathy CV: Regular without appreciable murmur, no JVD, no peripheral edema Resp: clear to auscultation bilaterally, normal RR and effort noted GI: soft, obese, rectus diastasis, seems mildly distended and tympanitic without tenderness. Active bowel sounds of normal character. No guarding or palpable organomegaly noted. Skin; warm and dry, no rash or jaundice noted Neuro: awake, alert and oriented x 3. Normal gross motor function and fluent speech   Labs:  Normal TSH September 2024  Radiologic Studies:  CTAP March 2024 as noted above (report on file)   Encounter Diagnoses  Name Primary?   Generalized abdominal pain Yes   Abdominal bloating    Chronic constipation     Assessment and Plan About a year and a half of protracted constipation that has not responded well to every treatment we have tried.  He does have bowel movements with the current regimen but it is always loose.  It still remains puzzling how this all seem to happen abruptly in October 2023 with the symptoms described above.  This would seem to be a motility issue,  since he had challenging colonic anatomy with redundancy and descending colon tortuosity at the time of his August 2023 colonoscopy.  And he is quite clear that he was  not struggling with constipation at the time of that colonoscopy, nor afterward until 2 months later when this began. He is somewhat more distended than I recall him being on prior exam, and he feels as if his bloating and distention is progressive in recent months.  So I am going to get another CT abdomen and pelvis to be sure he has not developed a focal area of partial obstruction in his colon.  He has had no abdominal or pelvic surgery in the past to lead to adhesions.  No prior episodes of diverticulitis that would likely cause a left colonic stricture. He would also like to try Motegrity again, so I gave him 3 weeks worth of samples-take a 2 mg tablet once daily.  I would like him to do that instead of his current Dulcolax and MiraLAX regimen, at least initially.  He plans to keep his appointment with me for his GI several months from now.   Charlie Pitter III  CC: Referring provider noted above

## 2023-12-01 DIAGNOSIS — E291 Testicular hypofunction: Secondary | ICD-10-CM | POA: Diagnosis not present

## 2023-12-13 ENCOUNTER — Other Ambulatory Visit: Payer: Self-pay | Admitting: Nurse Practitioner

## 2023-12-14 ENCOUNTER — Encounter: Payer: Self-pay | Admitting: Gastroenterology

## 2023-12-14 ENCOUNTER — Ambulatory Visit (HOSPITAL_BASED_OUTPATIENT_CLINIC_OR_DEPARTMENT_OTHER)
Admission: RE | Admit: 2023-12-14 | Discharge: 2023-12-14 | Disposition: A | Discharge status: Home / Self Care | Source: Ambulatory Visit | Attending: Gastroenterology | Admitting: Gastroenterology

## 2023-12-14 DIAGNOSIS — R1084 Generalized abdominal pain: Secondary | ICD-10-CM | POA: Diagnosis not present

## 2023-12-14 DIAGNOSIS — K5909 Other constipation: Secondary | ICD-10-CM | POA: Diagnosis not present

## 2023-12-14 DIAGNOSIS — K8020 Calculus of gallbladder without cholecystitis without obstruction: Secondary | ICD-10-CM | POA: Diagnosis not present

## 2023-12-14 DIAGNOSIS — R140 Abdominal distension (gaseous): Secondary | ICD-10-CM | POA: Insufficient documentation

## 2023-12-14 DIAGNOSIS — C679 Malignant neoplasm of bladder, unspecified: Secondary | ICD-10-CM | POA: Diagnosis not present

## 2023-12-14 DIAGNOSIS — N281 Cyst of kidney, acquired: Secondary | ICD-10-CM | POA: Diagnosis not present

## 2023-12-14 DIAGNOSIS — N200 Calculus of kidney: Secondary | ICD-10-CM | POA: Diagnosis not present

## 2023-12-14 LAB — POCT I-STAT CREATININE: Creatinine, Ser: 1.4 mg/dL — ABNORMAL HIGH (ref 0.61–1.24)

## 2023-12-14 MED ORDER — IOHEXOL 300 MG/ML  SOLN
100.0000 mL | Freq: Once | INTRAMUSCULAR | Status: AC | PRN
  Administered 2023-12-14: 100 mL via INTRAVENOUS

## 2023-12-14 NOTE — Telephone Encounter (Signed)
 FYI Dr Myrtie Neither see pt update

## 2023-12-18 DIAGNOSIS — M25532 Pain in left wrist: Secondary | ICD-10-CM | POA: Diagnosis not present

## 2023-12-25 ENCOUNTER — Encounter: Payer: Self-pay | Admitting: Gastroenterology

## 2023-12-28 DIAGNOSIS — K5909 Other constipation: Secondary | ICD-10-CM | POA: Diagnosis not present

## 2023-12-28 DIAGNOSIS — E7800 Pure hypercholesterolemia, unspecified: Secondary | ICD-10-CM | POA: Diagnosis not present

## 2023-12-28 DIAGNOSIS — Z0000 Encounter for general adult medical examination without abnormal findings: Secondary | ICD-10-CM | POA: Diagnosis not present

## 2023-12-28 DIAGNOSIS — I2510 Atherosclerotic heart disease of native coronary artery without angina pectoris: Secondary | ICD-10-CM | POA: Diagnosis not present

## 2023-12-28 DIAGNOSIS — N1831 Chronic kidney disease, stage 3a: Secondary | ICD-10-CM | POA: Diagnosis not present

## 2023-12-28 DIAGNOSIS — E538 Deficiency of other specified B group vitamins: Secondary | ICD-10-CM | POA: Diagnosis not present

## 2023-12-29 DIAGNOSIS — E291 Testicular hypofunction: Secondary | ICD-10-CM | POA: Diagnosis not present

## 2024-01-10 DIAGNOSIS — M654 Radial styloid tenosynovitis [de Quervain]: Secondary | ICD-10-CM | POA: Diagnosis not present

## 2024-01-21 ENCOUNTER — Other Ambulatory Visit: Payer: Self-pay | Admitting: Cardiovascular Disease

## 2024-01-26 DIAGNOSIS — E291 Testicular hypofunction: Secondary | ICD-10-CM | POA: Diagnosis not present

## 2024-02-07 ENCOUNTER — Telehealth: Payer: Self-pay | Admitting: Cardiovascular Disease

## 2024-02-07 ENCOUNTER — Telehealth: Payer: Self-pay

## 2024-02-07 DIAGNOSIS — M654 Radial styloid tenosynovitis [de Quervain]: Secondary | ICD-10-CM | POA: Diagnosis not present

## 2024-02-07 DIAGNOSIS — M1611 Unilateral primary osteoarthritis, right hip: Secondary | ICD-10-CM | POA: Diagnosis not present

## 2024-02-07 NOTE — Telephone Encounter (Signed)
   Name: Ryan Scott  DOB: Dec 08, 1950  MRN: [id number]  Primary Cardiologist: Magnus Schuller, MD   Preoperative team, please contact this patient and set up a phone call appointment for further preoperative risk assessment. Please obtain consent and complete medication review. Thank you for your help.  I confirm that guidance regarding antiplatelet and oral anticoagulation therapy has been completed and, if necessary, noted below.  Per office protocol, if patient is without any new symptoms or concerns at the time of their virtual visit, he may hold Plavix  for 5 days prior to procedure. Please resume Plavix  as soon as possible postprocedure, at the discretion of the surgeon.   Regarding ASA therapy, we recommend continuation of ASA throughout the perioperative period.   I also confirmed the patient resides in the state of [place] . As per [facility] Medical Board telemedicine laws, the patient must reside in the state in which the provider is licensed.   Jude Norton, NP 02/07/2024, 1:54 PM [facility]

## 2024-02-07 NOTE — Telephone Encounter (Signed)
 Patient needs to get a pre-op clearance sent to [facility]- ATTN: Valinda Gault in scheduling.  Located in: [facility] Address: [address], [place], [place] [address] Phone: [phone]  Thank you

## 2024-02-07 NOTE — Telephone Encounter (Signed)
  Patient Consent for Virtual Visit        Ryan Scott has provided verbal consent on 02/07/2024 for a virtual visit (video or telephone).  Appt scheduled for 02/13/24 @ 10am. Med rec anc consent complete.   CONSENT FOR VIRTUAL VISIT FOR:  Ryan Scott  By participating in this virtual visit I agree to the following:  I hereby voluntarily request, consent and authorize [facility] and its employed or contracted physicians, physician assistants, nurse practitioners or other licensed health care professionals (the Practitioner), to provide me with telemedicine health care services (the "Services") as deemed necessary by the treating Practitioner. I acknowledge and consent to receive the Services by the Practitioner via telemedicine. I understand that the telemedicine visit will involve communicating with the Practitioner through live audiovisual communication technology and the disclosure of certain medical information by electronic transmission. I acknowledge that I have been given the opportunity to request an in-person assessment or other available alternative prior to the telemedicine visit and am voluntarily participating in the telemedicine visit.  I understand that I have the right to withhold or withdraw my consent to the use of telemedicine in the course of my care at any time, without affecting my right to future care or treatment, and that the Practitioner or I may terminate the telemedicine visit at any time. I understand that I have the right to inspect all information obtained and/or recorded in the course of the telemedicine visit and may receive copies of available information for a reasonable fee.  I understand that some of the potential risks of receiving the Services via telemedicine include:  Delay or interruption in medical evaluation due to technological equipment failure or disruption; Information transmitted may not be sufficient (e.g. poor resolution of  images) to allow for appropriate medical decision making by the Practitioner; and/or  In rare instances, security protocols could fail, causing a breach of personal health information.  Furthermore, I acknowledge that it is my responsibility to provide information about my medical history, conditions and care that is complete and accurate to the best of my ability. I acknowledge that Practitioner's advice, recommendations, and/or decision may be based on factors not within their control, such as incomplete or inaccurate data provided by me or distortions of diagnostic images or specimens that may result from electronic transmissions. I understand that the practice of medicine is not an exact science and that Practitioner makes no warranties or guarantees regarding treatment outcomes. I acknowledge that a copy of this consent can be made available to me via my patient portal [facility] MyChart), or I can request a printed copy by calling the office of [facility].    I understand that my insurance will be billed for this visit.   I have read or had this consent read to me. I understand the contents of this consent, which adequately explains the benefits and risks of the Services being provided via telemedicine.  I have been provided ample opportunity to ask questions regarding this consent and the Services and have had my questions answered to my satisfaction. I give my informed consent for the services to be provided through the use of telemedicine in my medical care

## 2024-02-07 NOTE — Telephone Encounter (Signed)
 Appt scheduled for 02/13/24 @ 10am. Med rec anc consent complete.

## 2024-02-07 NOTE — Telephone Encounter (Signed)
   Pre-operative Risk Assessment    Patient Name: Ryan Scott  DOB: 1951/05/07 MRN: [id number]   Date of last office visit: 08/01/23 Magnus Schuller, MD Date of next office visit: NONE   Request for Surgical Clearance    Procedure:  LEFT DEQUERVAINS RELEASE  Date of Surgery:  Clearance TBD                                Surgeon:  Neville Barbone, MD Surgeon's Group or Practice Name:  [facility] Phone number:  [phone]  EXT. [phone] Fax number:  [phone]  ATTN: Mark Sil   Type of Clearance Requested:   - Medical  - Pharmacy:  Hold Aspirin  and Clopidogrel  (Plavix ) (CLEARANCE FORM REQUESTING ELIQUIS MED NOT LISTED IN EPIC)   Type of Anesthesia:  CHOICE   Additional requests/questions:    Signed, Collin Deal   02/07/2024, 12:32 PM

## 2024-02-08 NOTE — Telephone Encounter (Signed)
 Pre-op clearance has been entered.

## 2024-02-13 ENCOUNTER — Ambulatory Visit: Attending: Cardiovascular Disease | Admitting: Emergency Medicine

## 2024-02-13 DIAGNOSIS — Z01810 Encounter for preprocedural cardiovascular examination: Secondary | ICD-10-CM

## 2024-02-13 NOTE — Progress Notes (Signed)
 Virtual Visit via Telephone Note   Because of DANIEL RITTHALER co-morbid illnesses, Ryan Scott is at least at moderate risk for complications without adequate follow up.  This format is felt to be most appropriate for this patient at this time.  Due to technical limitations with video connection Web designer), today's appointment will be conducted as an audio only telehealth visit, and Ryan Scott verbally agreed to proceed in this manner.   All issues noted in this document were discussed and addressed.  No physical exam could be performed with this format.  Evaluation Performed:  Preoperative cardiovascular risk assessment _____________   Date:  02/13/2024   Patient ID:  Ryan Scott, DOB 31-Dec-1950, MRN [id number] Patient Location:  Home Provider location:   Office  Primary Care Provider:  Mordechai April, DO Primary Cardiologist:  Magnus Schuller, MD  Chief Complaint / Patient Profile   73 y.o. y/o male with a h/o coronary artery disease s/p PCI with DES to proximal LAD and PTCA of distal LAD, hypertension, first-degree AV block, hyperlipidemia, history of bladder cancer, incomplete right bundle branch block who is pending Left Dequervains release on date TBD by Dr. Agatha Horsfall with [facility] and presents today for telephonic preoperative cardiovascular risk assessment.  History of Present Illness    Ryan Scott is a 73 y.o. male who presents via audio/video conferencing for a telehealth visit today.  Pt was last seen in cardiology clinic on 08/01/2023 for by Dr. Loetta Ringer.  At that time Ryan Scott was doing well.  The patient is now pending procedure as outlined above. Since his last visit, Ryan Scott denies chest pain, shortness of breath, lower extremity edema, fatigue, palpitations, melena, hematuria, hemoptysis, diaphoresis, weakness, presyncope, syncope, orthopnea, and PND.  Today patient is doing well overall.  Ryan Scott is without any acute cardiovascular concerns or complaints.   Ryan Scott stays very active where Ryan Scott can walk 1-2 blocks, climb a flight of stairs, do heavy work around the house, and does yard work.  Patient also notes Ryan Scott does deep sea fishing several times a year which can be fairly active.  Overall Ryan Scott denies any chest pains or exertional angina.  Ryan Scott can achieve greater than 4 METS.  Past Medical History    Past Medical History:  Diagnosis Date   Arthritis    "back, neck, shoulders" (12/19/2017)   Bladder cancer (HCC)    Borderline hypertension    BPH (benign prostatic hyperplasia)    Chronic lower back pain    ED (erectile dysfunction)    High cholesterol    "started BP RX 12/18/2017"   History of adenomatous polyp of colon    tubular adenoma   History of kidney stones    Hypertension    Hypogonadism male    Left ureteral stone    Lower urinary tract symptoms (LUTS)    RBBB (right bundle branch block)    Past Surgical History:  Procedure Laterality Date   CARDIOVASCULAR STRESS TEST  10/16/2012   normal myocardial perfusion study,  no ischemia/  not gated   COLONOSCOPY  last one 07-26-2013   CORONARY STENT INTERVENTION N/A 12/19/2017   Procedure: CORONARY STENT INTERVENTION;  Surgeon: Millicent Ally, MD;  Location: [facility];  Service: Cardiovascular;  Laterality: N/A;   CYSTOSCOPY/RETROGRADE/URETEROSCOPY/STONE EXTRACTION WITH BASKET  1989   KNEE SURGERY Left 08/2021   LEFT HEART CATH AND CORONARY ANGIOGRAPHY N/A 12/19/2017   Procedure: LEFT HEART CATH AND CORONARY ANGIOGRAPHY;  Surgeon: Millicent Ally,  MD;  Location: [facility];  Service: Cardiovascular;  Laterality: N/A;  LAD    PROSTATE SURGERY  2002   PROSTATE SURGERY  2002   REFRACTIVE SURGERY Bilateral    SHOULDER ARTHROSCOPY WITH SUBACROMIAL DECOMPRESSION, ROTATOR CUFF REPAIR AND BICEP TENDON REPAIR Left 2013   SHOULDER SURGERY  09/2020   rotator cuff surgery   TONSILLECTOMY  ~ 1956   TRANSTHORACIC ECHOCARDIOGRAM  10/16/2012   normal LVF, ef 55-65%,  mild AV  sclerosis without stenosis,  mild MR and TR,  mild LAE,  trivial PR   TRANSURETHRAL RESECTION OF BLADDER TUMOR WITH GYRUS (TURBT-GYRUS)  1989   and URETEROSCOPIC STONE EXTRACTION    Allergies  No Known Allergies  Home Medications    Prior to Admission medications   Medication Sig Start Date End Date Taking? Authorizing Provider  allopurinol  (ZYLOPRIM ) 100 MG tablet Take 100 mg by mouth daily.    [provider]  aspirin  81 MG chewable tablet Chew 1 tablet (81 mg total) by mouth daily. 12/21/17   Meng, Hao, PA  atorvastatin  (LIPITOR) 40 MG tablet TAKE 1 TABLET BY MOUTH EVERY DAY 11/02/23   Millicent Ally, MD  bisacodyl  5 MG EC tablet Take 5 mg by mouth daily as needed for moderate constipation.    [provider]  clopidogrel  (PLAVIX ) 75 MG tablet TAKE 1 TABLET BY MOUTH EVERY DAY 11/13/23   Millicent Ally, MD  Cyanocobalamin  (VITAMIN B12) 1000 MCG TBCR Take 1 tablet by mouth daily.    [provider]  ezetimibe  (ZETIA ) 10 MG tablet TAKE 1 TABLET BY MOUTH EVERY DAY 01/24/24   Millicent Ally, MD  isosorbide  mononitrate (IMDUR ) 30 MG 24 hr tablet TAKE 1/2 TABLET (15 MG TOTAL) BY MOUTH DAILY. 12/14/23   Millicent Ally, MD  metoprolol  tartrate (LOPRESSOR ) 25 MG tablet TAKE 1/2 TABLET TWICE A DAY BY MOUTH 08/02/23   Millicent Ally, MD  nitroGLYCERIN  (NITROSTAT ) 0.4 MG SL tablet Place 1 tablet (0.4 mg total) under the tongue every 5 (five) minutes as needed for chest pain. 08/16/18 06/06/23  Millicent Ally, MD  polyethylene glycol (MIRALAX  / GLYCOLAX ) 17 g packet Take 34 g by mouth daily.    [provider]  testosterone  cypionate (DEPOTESTOTERONE CYPIONATE) 200 MG/ML injection Inject 200 mg into the muscle every 28 (twenty-eight) days.  12/04/12   [provider]    Physical Exam    Vital Signs:  Ryan Scott does not have vital signs available for review today.  Given telephonic nature of communication, physical exam is limited. AAOx3. NAD.  Normal affect.  Speech and respirations are unlabored.  Accessory Clinical Findings    None  Assessment & Plan    1.  Preoperative Cardiovascular Risk Assessment: According to the Revised Cardiac Risk Index (RCRI), his Perioperative Risk of Major Cardiac Event is (%): 0.9. His Functional Capacity in METs is: 8.33 according to the Duke Activity Status Index (DASI). Therefore, based on ACC/AHA guidelines, patient would be at acceptable risk for the planned procedure without further cardiovascular testing.  The patient was advised that if Ryan Scott develops new symptoms prior to surgery to contact our office to arrange for a follow-up visit, and Ryan Scott verbalized understanding.  Ryan Scott may hold Plavix  for 5 days prior to procedure. Please resume Plavix  as soon as possible postprocedure, at the discretion of the surgeon.    Regarding ASA therapy, we recommend continuation of ASA throughout the perioperative period.   A copy of  this note will be routed to requesting surgeon.  Time:   Today, I have spent 8 minutes with the patient with telehealth technology discussing medical history, symptoms, and management plan.     Ava Boatman, NP  02/13/2024, 10:04 AM

## 2024-02-26 DIAGNOSIS — E291 Testicular hypofunction: Secondary | ICD-10-CM | POA: Diagnosis not present

## 2024-03-06 NOTE — H&P (Signed)
 PREOPERATIVE H&P  Chief Complaint: left wrist pain  HPI: Ryan Scott is a 73 y.o. male who presents for his left wrist pain.  He has pain over the first dorsal compartment.  Worse with movement of the thumb.  We have done injections, anti-inflammatories and topical medicines.  He is on Aspirin  and Plavix  for previous stent in his heart.  He has been using a thumb spica brace that has not really improved symptoms.  It has been going on for at least 3-4 months.  He is frustrated with his limited function.   This is significantly impairing activities of daily living.  He has elected for surgical management.   Past Medical History:  Diagnosis Date   Arthritis    back, neck, shoulders (12/19/2017)   Bladder cancer (HCC)    Borderline hypertension    BPH (benign prostatic hyperplasia)    Chronic lower back pain    ED (erectile dysfunction)    High cholesterol    started BP RX 12/18/2017   History of adenomatous polyp of colon    tubular adenoma   History of kidney stones    Hypertension    Hypogonadism male    Left ureteral stone    Lower urinary tract symptoms (LUTS)    RBBB (right bundle branch block)    Past Surgical History:  Procedure Laterality Date   CARDIOVASCULAR STRESS TEST  10/16/2012   normal myocardial perfusion study,  no ischemia/  not gated   COLONOSCOPY  last one 07-26-2013   CORONARY STENT INTERVENTION N/A 12/19/2017   Procedure: CORONARY STENT INTERVENTION;  Surgeon: Millicent Ally, MD;  Location: [facility];  Service: Cardiovascular;  Laterality: N/A;   CYSTOSCOPY/RETROGRADE/URETEROSCOPY/STONE EXTRACTION WITH BASKET  1989   KNEE SURGERY Left 08/2021   LEFT HEART CATH AND CORONARY ANGIOGRAPHY N/A 12/19/2017   Procedure: LEFT HEART CATH AND CORONARY ANGIOGRAPHY;  Surgeon: Millicent Ally, MD;  Location: [facility];  Service: Cardiovascular;  Laterality: N/A;  LAD    PROSTATE SURGERY  2002   PROSTATE SURGERY  2002   REFRACTIVE SURGERY  Bilateral    SHOULDER ARTHROSCOPY WITH SUBACROMIAL DECOMPRESSION, ROTATOR CUFF REPAIR AND BICEP TENDON REPAIR Left 2013   SHOULDER SURGERY  09/2020   rotator cuff surgery   TONSILLECTOMY  ~ 1956   TRANSTHORACIC ECHOCARDIOGRAM  10/16/2012   normal LVF, ef 55-65%,  mild AV sclerosis without stenosis,  mild MR and TR,  mild LAE,  trivial PR   TRANSURETHRAL RESECTION OF BLADDER TUMOR WITH GYRUS (TURBT-GYRUS)  1989   and URETEROSCOPIC STONE EXTRACTION   Social History   Socioeconomic History   Marital status: Married    Spouse name: Not on file   Number of children: Not on file   Years of education: Not on file   Highest education level: Not on file  Occupational History   Not on file  Tobacco Use   Smoking status: Never   Smokeless tobacco: Former    Types: Chew    Quit date: 1991  Vaping Use   Vaping status: Never Used  Substance and Sexual Activity   Alcohol use: Yes    Alcohol/week: 2.0 standard drinks of alcohol    Types: 2 Standard drinks or equivalent per week   Drug use: Not Currently   Sexual activity: Not on file  Other Topics Concern   Not on file  Social History Narrative   Not on file   Social Drivers of Health   Financial  Resource Strain: Not on file  Food Insecurity: No Food Insecurity (06/06/2023)   Hunger Vital Sign    Worried About Running Out of Food in the Last Year: Never true    Ran Out of Food in the Last Year: Never true  Transportation Needs: No Transportation Needs (06/06/2023)   PRAPARE - Administrator, Civil Service (Medical): No    Lack of Transportation (Non-Medical): No  Physical Activity: Not on file  Stress: Not on file  Social Connections: Not on file   Family History  Problem Relation Age of Onset   Breast cancer Mother    Thyroid  disease Mother    Heart attack Father    Heart disease Father    Arthritis Father    Hypertension Father    Arthritis Sister    Stroke Maternal Grandmother    Lung cancer Maternal  Grandfather    Other Paternal Grandmother        brain tumor   Aneurysm Paternal Grandfather    Colon cancer Neg Hx    Esophageal cancer Neg Hx    Rectal cancer Neg Hx    Stomach cancer Neg Hx    Liver cancer Neg Hx    Pancreatic cancer Neg Hx    No Known Allergies Prior to Admission medications   Medication Sig Start Date End Date Taking? Authorizing Provider  allopurinol  (ZYLOPRIM ) 100 MG tablet Take 100 mg by mouth daily.    [provider]  aspirin  81 MG chewable tablet Chew 1 tablet (81 mg total) by mouth daily. 12/21/17   Meng, Hao, PA  atorvastatin  (LIPITOR) 40 MG tablet TAKE 1 TABLET BY MOUTH EVERY DAY 11/02/23   Millicent Ally, MD  bisacodyl  5 MG EC tablet Take 5 mg by mouth daily as needed for moderate constipation.    [provider]  clopidogrel  (PLAVIX ) 75 MG tablet TAKE 1 TABLET BY MOUTH EVERY DAY 11/13/23   Millicent Ally, MD  Cyanocobalamin  (VITAMIN B12) 1000 MCG TBCR Take 1 tablet by mouth daily.    [provider]  ezetimibe  (ZETIA ) 10 MG tablet TAKE 1 TABLET BY MOUTH EVERY DAY 01/24/24   Millicent Ally, MD  isosorbide  mononitrate (IMDUR ) 30 MG 24 hr tablet TAKE 1/2 TABLET (15 MG TOTAL) BY MOUTH DAILY. 12/14/23   Millicent Ally, MD  metoprolol  tartrate (LOPRESSOR ) 25 MG tablet TAKE 1/2 TABLET TWICE A DAY BY MOUTH 08/02/23   Millicent Ally, MD  nitroGLYCERIN  (NITROSTAT ) 0.4 MG SL tablet Place 1 tablet (0.4 mg total) under the tongue every 5 (five) minutes as needed for chest pain. 08/16/18 06/06/23  Millicent Ally, MD  polyethylene glycol (MIRALAX  / GLYCOLAX ) 17 g packet Take 34 g by mouth daily.    [provider]  testosterone  cypionate (DEPOTESTOTERONE CYPIONATE) 200 MG/ML injection Inject 200 mg into the muscle every 28 (twenty-eight) days.  12/04/12   [provider]     Positive ROS: All other systems have been reviewed and were otherwise negative with the exception of those mentioned in the HPI and as above.  Physical  Exam: General: Alert, no acute distress Cardiovascular: No pedal edema Respiratory: No cyanosis, no use of accessory musculature GI: No organomegaly, abdomen is soft and non-tender Skin: No lesions in the area of chief complaint Neurologic: Sensation intact distally Psychiatric: Patient is competent for consent with normal mood and affect Lymphatic: No axillary or cervical lymphadenopathy  MUSCULOSKELETAL: On exam positive Finkelstein's test.  Pain is recreated with  activation of the thumb.  He does not have that much in the way of pain over the [facility] joint.    Assessment: Left thumb de Quervain's.   Plan: Plan for Procedure(s): INCISION, TENDON SHEATH  The risks benefits and alternatives were discussed with the patient including but not limited to the risks of nonoperative treatment, versus surgical intervention including infection, bleeding, nerve injury,  blood clots, cardiopulmonary complications, morbidity, mortality, among others, and they were willing to proceed.   Ryan Esper K Joene Gelder, PA-C    03/06/2024 1:24 PM

## 2024-03-20 ENCOUNTER — Encounter (HOSPITAL_BASED_OUTPATIENT_CLINIC_OR_DEPARTMENT_OTHER): Payer: Self-pay | Admitting: Orthopedic Surgery

## 2024-03-20 ENCOUNTER — Other Ambulatory Visit: Payer: Self-pay

## 2024-03-25 ENCOUNTER — Telehealth: Payer: Self-pay | Admitting: Cardiology

## 2024-03-25 DIAGNOSIS — E291 Testicular hypofunction: Secondary | ICD-10-CM | POA: Diagnosis not present

## 2024-03-25 DIAGNOSIS — I10 Essential (primary) hypertension: Secondary | ICD-10-CM

## 2024-03-25 DIAGNOSIS — I25119 Atherosclerotic heart disease of native coronary artery with unspecified angina pectoris: Secondary | ICD-10-CM

## 2024-03-25 DIAGNOSIS — E785 Hyperlipidemia, unspecified: Secondary | ICD-10-CM

## 2024-03-25 DIAGNOSIS — Z79899 Other long term (current) drug therapy: Secondary | ICD-10-CM

## 2024-03-25 DIAGNOSIS — N1831 Chronic kidney disease, stage 3a: Secondary | ICD-10-CM | POA: Diagnosis not present

## 2024-03-25 DIAGNOSIS — I2510 Atherosclerotic heart disease of native coronary artery without angina pectoris: Secondary | ICD-10-CM | POA: Diagnosis not present

## 2024-03-25 NOTE — Telephone Encounter (Signed)
 Patient wants to get lab orders to be done prior to his visit on 11/7.

## 2024-03-26 ENCOUNTER — Other Ambulatory Visit: Payer: Self-pay

## 2024-03-26 ENCOUNTER — Encounter (HOSPITAL_BASED_OUTPATIENT_CLINIC_OR_DEPARTMENT_OTHER)
Admission: RE | Disposition: A | Discharge status: Home / Self Care | Payer: Self-pay | Source: Ambulatory Visit | Attending: Orthopedic Surgery

## 2024-03-26 ENCOUNTER — Ambulatory Visit (HOSPITAL_BASED_OUTPATIENT_CLINIC_OR_DEPARTMENT_OTHER)
Admission: RE | Admit: 2024-03-26 | Discharge: 2024-03-26 | Disposition: A | Discharge status: Home / Self Care | Source: Ambulatory Visit | Attending: Orthopedic Surgery | Admitting: Orthopedic Surgery

## 2024-03-26 ENCOUNTER — Ambulatory Visit (HOSPITAL_BASED_OUTPATIENT_CLINIC_OR_DEPARTMENT_OTHER): Admitting: Anesthesiology

## 2024-03-26 ENCOUNTER — Encounter (HOSPITAL_BASED_OUTPATIENT_CLINIC_OR_DEPARTMENT_OTHER): Payer: Self-pay | Admitting: Orthopedic Surgery

## 2024-03-26 DIAGNOSIS — M654 Radial styloid tenosynovitis [de Quervain]: Secondary | ICD-10-CM

## 2024-03-26 DIAGNOSIS — Z79899 Other long term (current) drug therapy: Secondary | ICD-10-CM | POA: Diagnosis not present

## 2024-03-26 DIAGNOSIS — Z7902 Long term (current) use of antithrombotics/antiplatelets: Secondary | ICD-10-CM | POA: Diagnosis not present

## 2024-03-26 DIAGNOSIS — Z7982 Long term (current) use of aspirin: Secondary | ICD-10-CM | POA: Insufficient documentation

## 2024-03-26 DIAGNOSIS — N183 Chronic kidney disease, stage 3 unspecified: Secondary | ICD-10-CM | POA: Diagnosis not present

## 2024-03-26 DIAGNOSIS — I129 Hypertensive chronic kidney disease with stage 1 through stage 4 chronic kidney disease, or unspecified chronic kidney disease: Secondary | ICD-10-CM | POA: Diagnosis not present

## 2024-03-26 DIAGNOSIS — I2510 Atherosclerotic heart disease of native coronary artery without angina pectoris: Secondary | ICD-10-CM | POA: Insufficient documentation

## 2024-03-26 DIAGNOSIS — Z955 Presence of coronary angioplasty implant and graft: Secondary | ICD-10-CM | POA: Diagnosis not present

## 2024-03-26 DIAGNOSIS — I10 Essential (primary) hypertension: Secondary | ICD-10-CM | POA: Diagnosis not present

## 2024-03-26 DIAGNOSIS — Z01818 Encounter for other preprocedural examination: Secondary | ICD-10-CM

## 2024-03-26 DIAGNOSIS — I25119 Atherosclerotic heart disease of native coronary artery with unspecified angina pectoris: Secondary | ICD-10-CM | POA: Diagnosis not present

## 2024-03-26 HISTORY — PX: TENOLYSIS: SHX396

## 2024-03-26 SURGERY — INCISION, TENDON SHEATH
Anesthesia: Monitor Anesthesia Care | Site: Hand | Laterality: Left

## 2024-03-26 MED ORDER — HYDROCODONE-ACETAMINOPHEN 10-325 MG PO TABS: 1.0000 | ORAL_TABLET | ORAL | 0 refills | Status: AC | PRN

## 2024-03-26 MED ORDER — LACTATED RINGERS IV SOLN: INTRAVENOUS | Status: DC

## 2024-03-26 MED ORDER — BUPIVACAINE HCL (PF) 0.5 % IJ SOLN
INTRAMUSCULAR | Status: DC | PRN
  Administered 2024-03-26: 10 mL

## 2024-03-26 MED ORDER — FENTANYL CITRATE (PF) 100 MCG/2ML IJ SOLN: 25.0000 ug | INTRAMUSCULAR | Status: DC | PRN

## 2024-03-26 MED ORDER — CEFAZOLIN SODIUM-DEXTROSE 2-4 GM/100ML-% IV SOLN
2.0000 g | INTRAVENOUS | Status: AC
  Administered 2024-03-26: 2 g via INTRAVENOUS

## 2024-03-26 MED ORDER — ONDANSETRON HCL 4 MG/2ML IJ SOLN
INTRAMUSCULAR | Status: DC | PRN
  Administered 2024-03-26: 4 mg via INTRAVENOUS

## 2024-03-26 MED ORDER — POVIDONE-IODINE 10 % EX SWAB
2.0000 | Freq: Once | CUTANEOUS | Status: AC
Start: 2024-03-26 — End: 2024-03-26
  Administered 2024-03-26: 2 via TOPICAL

## 2024-03-26 MED ORDER — PROPOFOL 500 MG/50ML IV EMUL
INTRAVENOUS | Status: DC | PRN
  Administered 2024-03-26: 100 ug/kg/min via INTRAVENOUS

## 2024-03-26 MED ORDER — FENTANYL CITRATE (PF) 100 MCG/2ML IJ SOLN
INTRAMUSCULAR | Status: DC | PRN
  Administered 2024-03-26: 50 ug via INTRAVENOUS

## 2024-03-26 MED ORDER — FENTANYL CITRATE (PF) 100 MCG/2ML IJ SOLN
INTRAMUSCULAR | Status: AC
Start: 2024-03-26 — End: 2024-03-26
  Filled 2024-03-26: qty 2

## 2024-03-26 MED ORDER — AMISULPRIDE (ANTIEMETIC) 5 MG/2ML IV SOLN: 10.0000 mg | Freq: Once | INTRAVENOUS | Status: DC | PRN

## 2024-03-26 MED ORDER — POVIDONE-IODINE 7.5 % EX SOLN
Freq: Once | CUTANEOUS | Status: AC
  Filled 2024-03-26: qty 118

## 2024-03-26 MED ORDER — DEXMEDETOMIDINE HCL IN NACL 200 MCG/50ML IV SOLN
INTRAVENOUS | Status: DC | PRN
  Administered 2024-03-26 (×2): 4 ug via INTRAVENOUS

## 2024-03-26 MED ORDER — PROPOFOL 10 MG/ML IV BOLUS
INTRAVENOUS | Status: DC | PRN
  Administered 2024-03-26: 10 mg via INTRAVENOUS
  Administered 2024-03-26: 20 mg via INTRAVENOUS
  Administered 2024-03-26: 10 mg via INTRAVENOUS

## 2024-03-26 MED ORDER — EPHEDRINE SULFATE (PRESSORS) 50 MG/ML IJ SOLN
INTRAMUSCULAR | Status: DC | PRN
  Administered 2024-03-26 (×3): 5 mg via INTRAVENOUS

## 2024-03-26 MED ORDER — CEFAZOLIN SODIUM-DEXTROSE 2-4 GM/100ML-% IV SOLN
INTRAVENOUS | Status: AC
  Filled 2024-03-26: qty 100

## 2024-03-26 MED ORDER — ACETAMINOPHEN 500 MG PO TABS
1000.0000 mg | ORAL_TABLET | Freq: Once | ORAL | Status: AC
  Administered 2024-03-26: 1000 mg via ORAL

## 2024-03-26 MED ORDER — ACETAMINOPHEN 500 MG PO TABS
ORAL_TABLET | ORAL | Status: AC
Start: 2024-03-26 — End: 2024-03-26
  Filled 2024-03-26: qty 2

## 2024-03-26 MED ORDER — LIDOCAINE 2% (20 MG/ML) 5 ML SYRINGE
INTRAMUSCULAR | Status: DC | PRN
  Administered 2024-03-26: 40 mg via INTRAVENOUS

## 2024-03-26 MED ORDER — 0.9 % SODIUM CHLORIDE (POUR BTL) OPTIME
TOPICAL | Status: DC | PRN
  Administered 2024-03-26: 200 mL

## 2024-03-26 MED ORDER — ONDANSETRON HCL 4 MG/2ML IJ SOLN
4.0000 mg | Freq: Once | INTRAMUSCULAR | Status: DC | PRN
Start: 2024-03-26 — End: 2024-03-26

## 2024-03-26 SURGICAL SUPPLY — 32 items
BLADE SURG 15 STRL LF DISP TIS (BLADE) ×1 IMPLANT
BNDG ELASTIC 3INX 5YD STR LF (GAUZE/BANDAGES/DRESSINGS) ×1 IMPLANT
BNDG ESMARK 4X9 LF (GAUZE/BANDAGES/DRESSINGS) IMPLANT
CORD BIPOLAR FORCEPS 12FT (ELECTRODE) ×1 IMPLANT
COVER BACK TABLE 60X90IN (DRAPES) ×1 IMPLANT
CUFF TOURN SGL QUICK 18X4 (TOURNIQUET CUFF) IMPLANT
DRAPE EXTREMITY T 121X128X90 (DISPOSABLE) ×1 IMPLANT
DRAPE IMP U-DRAPE 54X76 (DRAPES) ×1 IMPLANT
DRAPE SURG 17X23 STRL (DRAPES) ×1 IMPLANT
DURAPREP 26ML APPLICATOR (WOUND CARE) ×1 IMPLANT
GAUZE SPONGE 4X4 12PLY STRL (GAUZE/BANDAGES/DRESSINGS) ×1 IMPLANT
GLOVE BIO SURGEON STRL SZ7 (GLOVE) ×1 IMPLANT
GLOVE BIOGEL PI IND STRL 7.0 (GLOVE) ×1 IMPLANT
GLOVE BIOGEL PI IND STRL 8 (GLOVE) ×2 IMPLANT
GLOVE ORTHO TXT STRL SZ7.5 (GLOVE) ×1 IMPLANT
GOWN STRL REUS W/ TWL LRG LVL3 (GOWN DISPOSABLE) ×1 IMPLANT
GOWN STRL REUS W/ TWL XL LVL3 (GOWN DISPOSABLE) ×2 IMPLANT
NDL HYPO 25X1 1.5 SAFETY (NEEDLE) IMPLANT
NEEDLE HYPO 25X1 1.5 SAFETY (NEEDLE) ×1 IMPLANT
NS IRRIG 1000ML POUR BTL (IV SOLUTION) ×1 IMPLANT
PACK BASIN DAY SURGERY FS (CUSTOM PROCEDURE TRAY) ×1 IMPLANT
PAD CAST 3X4 CTTN HI CHSV (CAST SUPPLIES) ×1 IMPLANT
PADDING CAST ABS COTTON 3X4 (CAST SUPPLIES) ×1 IMPLANT
PADDING CAST ABS COTTON 4X4 ST (CAST SUPPLIES) ×1 IMPLANT
SPLINT PLASTER CAST XFAST 3X15 (CAST SUPPLIES) IMPLANT
SPLINT PLASTER CAST XFAST 4X15 (CAST SUPPLIES) IMPLANT
STOCKINETTE 4X48 STRL (DRAPES) ×1 IMPLANT
SUT ETHILON 4 0 PS 2 18 (SUTURE) ×1 IMPLANT
SYR BULB EAR ULCER 3OZ GRN STR (SYRINGE) ×1 IMPLANT
SYR CONTROL 10ML LL (SYRINGE) IMPLANT
TOWEL GREEN STERILE FF (TOWEL DISPOSABLE) ×1 IMPLANT
UNDERPAD 30X36 HEAVY ABSORB (UNDERPADS AND DIAPERS) ×1 IMPLANT

## 2024-03-26 NOTE — Discharge Instructions (Addendum)
 Diet: As you were doing prior to hospitalization   Shower:  May shower but keep the wounds dry, use an occlusive plastic wrap, NO SOAKING IN TUB.  If the bandage gets wet, change with a clean dry gauze.  If you have a splint on, leave the splint in place and keep the splint dry with a plastic bag.  Dressing:  You may change your dressing 3-5 days after surgery, unless you have a splint.  If you have a splint, then just leave the splint in place and we will change your bandages during your first follow-up appointment.    If you had hand or foot surgery, we will plan to remove your stitches in about 2 weeks in the office.  For all other surgeries, there are sticky tapes (steri-strips) on your wounds and all the stitches are absorbable.  Leave the steri-strips in place when changing your dressings, they will peel off with time, usually 2-3 weeks.  Activity:  Increase activity slowly as tolerated, but follow the weight bearing instructions below.  The rules on driving is that you can not be taking narcotics while you drive, and you must feel in control of the vehicle.    Weight Bearing:   No gripping or twisting with left hand. Limit weight bearing to nothing heavier than your phone.   To prevent constipation: you may use a stool softener such as -  Colace (over the counter) 100 mg by mouth twice a day  Drink plenty of fluids (prune juice may be helpful) and high fiber foods Miralax  (over the counter) for constipation as needed.    Itching:  If you experience itching with your medications, try taking only a single pain pill, or even half a pain pill at a time.  You may take up to 10 pain pills per day, and you can also use benadryl over the counter for itching or also to help with sleep.   Precautions:  If you experience chest pain or shortness of breath - call 911 immediately for transfer to the hospital emergency department!!  If you develop a fever greater that 101 F, purulent drainage from  wound, increased redness or drainage from wound, or calf pain -- Call the office at [phone]                                                Follow- Up Appointment:  Please call for an appointment to be seen in 2 weeks [facility] - [phone]   No Tylenol  until after 2:30pm today, if needed.    Post Anesthesia Home Care Instructions  Activity: Get plenty of rest for the remainder of the day. A responsible individual must stay with you for 24 hours following the procedure.  For the next 24 hours, DO NOT: -Drive a car -Advertising copywriter -Drink alcoholic beverages -Take any medication unless instructed by your physician -Make any legal decisions or sign important papers.  Meals: Start with liquid foods such as gelatin or soup. Progress to regular foods as tolerated. Avoid greasy, spicy, heavy foods. If nausea and/or vomiting occur, drink only clear liquids until the nausea and/or vomiting subsides. Call your physician if vomiting continues.  Special Instructions/Symptoms: Your throat may feel dry or sore from the anesthesia or the breathing tube placed in your throat during surgery. If this causes discomfort, gargle with warm salt water.  The discomfort should disappear within 24 hours.  If you had a scopolamine patch placed behind your ear for the management of post- operative nausea and/or vomiting:  1. The medication in the patch is effective for 72 hours, after which it should be removed.  Wrap patch in a tissue and discard in the trash. Wash hands thoroughly with soap and water. 2. You may remove the patch earlier than 72 hours if you experience unpleasant side effects which may include dry mouth, dizziness or visual disturbances. 3. Avoid touching the patch. Wash your hands with soap and water after contact with the patch.

## 2024-03-26 NOTE — Transfer of Care (Signed)
 Immediate Anesthesia Transfer of Care Note  Patient: Ryan Scott  Procedure(s) Performed: INCISION, TENDON SHEATH, Dequarvains left thumb (Left: Hand)  Patient Location: PACU  Anesthesia Type:MAC  Level of Consciousness: awake, alert , and oriented  Airway & Oxygen Therapy: Patient Spontanous Breathing and Patient connected to nasal cannula oxygen  Post-op Assessment: Report given to RN and Post -op Vital signs reviewed and stable  Post vital signs: Reviewed and stable  Last Vitals:  Vitals Value Taken Time  BP 117/72 03/26/24 11:31  Temp    Pulse 55 03/26/24 11:37  Resp 18 03/26/24 11:37  SpO2 96 % 03/26/24 11:37  Vitals shown include unfiled device data.  Last Pain:  Vitals:   03/26/24 0822  TempSrc:   PainSc: 0-No pain         Complications: No notable events documented.

## 2024-03-26 NOTE — Telephone Encounter (Signed)
 Spoke with pt and advised per Dr Anner, lab orders have been placed an pt may come prior to 08/02/24 appointment for fasting labs.  Lab is available 8am-430pm Monday - Friday.  Pt verbalizes understanding and agrees with current plan.

## 2024-03-26 NOTE — Interval H&P Note (Signed)
 History and Physical Interval Note:  03/26/2024 9:48 AM  Ryan Scott  has presented today for surgery, with the diagnosis of De Quervain's tenosynovitis.  The various methods of treatment have been discussed with the patient and family. After consideration of risks, benefits and other options for treatment, the patient has consented to  left dequervains release as a surgical intervention.  The patient's history has been reviewed, patient examined, no change in status, stable for surgery.  I have reviewed the patient's chart and labs.  Questions were answered to the patient's satisfaction.     [name]

## 2024-03-26 NOTE — Anesthesia Postprocedure Evaluation (Signed)
 Anesthesia Post Note  Patient: Ryan Scott  Procedure(s) Performed: INCISION, TENDON SHEATH, Dequarvains left thumb (Left: Hand)     Patient location during evaluation: PACU Anesthesia Type: MAC Level of consciousness: awake Pain management: pain level controlled Vital Signs Assessment: post-procedure vital signs reviewed and stable Respiratory status: spontaneous breathing, nonlabored ventilation and respiratory function stable Cardiovascular status: blood pressure returned to baseline and stable Postop Assessment: no apparent nausea or vomiting Anesthetic complications: no   No notable events documented.  Last Vitals:  Vitals:   03/26/24 1200 03/26/24 1210  BP: 114/74 117/78  Pulse: (!) 55 (!) 51  Resp: 13 14  Temp:  (!) 36.2 C  SpO2: 92% 93%    Last Pain:  Vitals:   03/26/24 1210  TempSrc:   PainSc: 0-No pain                 Lametria Klunk P Phuc Kluttz

## 2024-03-26 NOTE — Telephone Encounter (Signed)
 That would be fine.  We usually check lipids, CMP and A1c.

## 2024-03-26 NOTE — Op Note (Signed)
 03/26/2024  11:21 AM  PATIENT:  Ryan Scott    PRE-OPERATIVE DIAGNOSIS: Left De Quervain's stenosing tenosynovitis, first dorsal compartment  POST-OPERATIVE DIAGNOSIS:  Same  PROCEDURE: Left first dorsal compartment release  SURGEON:  [name], MD  PHYSICIAN ASSISTANT: Army Daring, PA-C, present and scrubbed throughout the case, critical for completion in a timely fashion, and for retraction, instrumentation, and closure.  ANESTHESIA:   General  PREOPERATIVE INDICATIONS:  Ryan Scott is a  73 y.o. male with a diagnosis of De Quervain's tenosynovitis who failed conservative measures and elected for surgical management.    The risks benefits and alternatives were discussed with the patient preoperatively including but not limited to the risks of infection, bleeding, nerve injury, cardiopulmonary complications, the need for revision surgery, among others, and the patient was willing to proceed.  ESTIMATED BLOOD LOSS: Minimal  OPERATIVE IMPLANTS:   * No implants in log *  OPERATIVE FINDINGS: He had thickening of the first dorsal compartment, although I did not encounter a separate sheath for the abductor pollicis longus.  There were many slips however to this tendon.  OPERATIVE PROCEDURE: The patient was brought to the operating room and placed in the supine position.  Local monitored anesthesia care was administered.  Timeout was performed.  The left dorsal wrist was prepped with alcohol and then injected with a local anesthetic.  The upper extremity was then prepped and draped in usual sterile fashion.  Timeout performed again.  Transverse incision was made 1 cm proximal to the radial styloid.  Dissection carried down bluntly through the subcutaneous tissue taking care to protect any branches of the dorsal radial nerve, the first dorsal compartment was identified, and then incised in line with its fibers.  The abductor pollicis longus was identified, and explored, and I  did not find a second sheath.  The extensor pollicis brevis was also identified, and both tendons were moving freely after release of the dorsal compartment.  The wounds were irrigated copiously, the tourniquet was released, total tourniquet time was 23 minutes.  I used a bipolar cautery to optimize hemostasis, and the skin was closed with nylon followed by sterile gauze and a thumb spica splint.  He was awakened and returned to the PACU in stable and satisfactory condition.  There were no complications and he tolerated the procedure well.  [name], MD

## 2024-03-26 NOTE — Anesthesia Preprocedure Evaluation (Addendum)
 Anesthesia Evaluation  Patient identified by MRN, date of birth, ID band Patient awake    Reviewed: Allergy & Precautions, NPO status , Patient's Chart, lab work & pertinent test results  Airway Mallampati: II  TM Distance: >3 FB Neck ROM: Full    Dental no notable dental hx.    Pulmonary neg pulmonary ROS   Pulmonary exam normal        Cardiovascular hypertension, Pt. on home beta blockers + CAD and + Cardiac Stents  Normal cardiovascular exam     Neuro/Psych negative neurological ROS  negative psych ROS   GI/Hepatic negative GI ROS, Neg liver ROS,,,  Endo/Other  negative endocrine ROS    Renal/GU Renal disease     Musculoskeletal  (+) Arthritis ,    Abdominal  (+) + obese  Peds  Hematology  (+) Blood dyscrasia (Plavix )   Anesthesia Other Findings De Quervain's tenosynovitis  Reproductive/Obstetrics                             Anesthesia Physical Anesthesia Plan  ASA: 3  Anesthesia Plan: MAC   Post-op Pain Management:    Induction:   PONV Risk Score and Plan: 1 and Ondansetron , Dexamethasone, Propofol infusion and Treatment may vary due to age or medical condition  Airway Management Planned: Simple Face Mask  Additional Equipment:   Intra-op Plan:   Post-operative Plan:   Informed Consent: I have reviewed the patients History and Physical, chart, labs and discussed the procedure including the risks, benefits and alternatives for the proposed anesthesia with the patient or authorized representative who has indicated his/her understanding and acceptance.     Dental advisory given  Plan Discussed with: CRNA  Anesthesia Plan Comments:        Anesthesia Quick Evaluation

## 2024-03-27 ENCOUNTER — Encounter (HOSPITAL_BASED_OUTPATIENT_CLINIC_OR_DEPARTMENT_OTHER): Payer: Self-pay | Admitting: Orthopedic Surgery

## 2024-04-10 DIAGNOSIS — R140 Abdominal distension (gaseous): Secondary | ICD-10-CM | POA: Diagnosis not present

## 2024-04-10 DIAGNOSIS — K582 Mixed irritable bowel syndrome: Secondary | ICD-10-CM | POA: Diagnosis not present

## 2024-04-22 DIAGNOSIS — E291 Testicular hypofunction: Secondary | ICD-10-CM | POA: Diagnosis not present

## 2024-05-20 DIAGNOSIS — E291 Testicular hypofunction: Secondary | ICD-10-CM | POA: Diagnosis not present

## 2024-06-18 DIAGNOSIS — E291 Testicular hypofunction: Secondary | ICD-10-CM | POA: Diagnosis not present

## 2024-06-18 DIAGNOSIS — C672 Malignant neoplasm of lateral wall of bladder: Secondary | ICD-10-CM | POA: Diagnosis not present

## 2024-06-18 DIAGNOSIS — R350 Frequency of micturition: Secondary | ICD-10-CM | POA: Diagnosis not present

## 2024-06-18 DIAGNOSIS — N132 Hydronephrosis with renal and ureteral calculous obstruction: Secondary | ICD-10-CM | POA: Diagnosis not present

## 2024-06-18 DIAGNOSIS — N401 Enlarged prostate with lower urinary tract symptoms: Secondary | ICD-10-CM | POA: Diagnosis not present

## 2024-06-18 DIAGNOSIS — K8020 Calculus of gallbladder without cholecystitis without obstruction: Secondary | ICD-10-CM | POA: Diagnosis not present

## 2024-06-18 DIAGNOSIS — R311 Benign essential microscopic hematuria: Secondary | ICD-10-CM | POA: Diagnosis not present

## 2024-06-18 DIAGNOSIS — N202 Calculus of kidney with calculus of ureter: Secondary | ICD-10-CM | POA: Diagnosis not present

## 2024-06-18 DIAGNOSIS — K8050 Calculus of bile duct without cholangitis or cholecystitis without obstruction: Secondary | ICD-10-CM | POA: Diagnosis not present

## 2024-06-19 ENCOUNTER — Encounter: Payer: Self-pay | Admitting: Gastroenterology

## 2024-06-20 DIAGNOSIS — E291 Testicular hypofunction: Secondary | ICD-10-CM | POA: Diagnosis not present

## 2024-06-20 DIAGNOSIS — N401 Enlarged prostate with lower urinary tract symptoms: Secondary | ICD-10-CM | POA: Diagnosis not present

## 2024-06-21 ENCOUNTER — Other Ambulatory Visit: Payer: Self-pay | Admitting: Urology

## 2024-06-21 ENCOUNTER — Telehealth (HOSPITAL_BASED_OUTPATIENT_CLINIC_OR_DEPARTMENT_OTHER): Payer: Self-pay | Admitting: *Deleted

## 2024-06-21 ENCOUNTER — Telehealth: Payer: Self-pay | Admitting: Cardiology

## 2024-06-21 NOTE — Telephone Encounter (Signed)
   Name: Ryan Scott  DOB: 07/17/1951  MRN: [id number]  Primary Cardiologist: Debby Sor, MD (Inactive)   Preoperative team, please contact this patient and set up a phone call appointment for further preoperative risk assessment. Please obtain consent and complete medication review. Thank you for your help.  I confirm that guidance regarding antiplatelet and oral anticoagulation therapy has been completed and, if necessary, noted below.  Per office protocol, if patient is without any new symptoms or concerns at the time of their virtual visit, he may  hold Plavix  for 5 days prior to procedure. Please resume Plavix  as soon as possible postprocedure, at the discretion of the surgeon.    I also confirmed the patient resides in the state of [place] . As per [facility] Medical Board telemedicine laws, the patient must reside in the state in which the provider is licensed.   Lamarr Satterfield, NP 06/21/2024, 3:23 PM [facility]

## 2024-06-21 NOTE — Telephone Encounter (Signed)
 Pt has been scheduled tele preop appt 06/27/24. Med rec and consent are done.      Patient Consent for Virtual Visit        Ryan Scott has provided verbal consent on 06/21/2024 for a virtual visit (video or telephone).   CONSENT FOR VIRTUAL VISIT FOR:  Ryan Scott  By participating in this virtual visit I agree to the following:  I hereby voluntarily request, consent and authorize [facility] and its employed or contracted physicians, physician assistants, nurse practitioners or other licensed health care professionals (the Practitioner), to provide me with telemedicine health care services (the "Services) as deemed necessary by the treating Practitioner. I acknowledge and consent to receive the Services by the Practitioner via telemedicine. I understand that the telemedicine visit will involve communicating with the Practitioner through live audiovisual communication technology and the disclosure of certain medical information by electronic transmission. I acknowledge that I have been given the opportunity to request an in-person assessment or other available alternative prior to the telemedicine visit and am voluntarily participating in the telemedicine visit.  I understand that I have the right to withhold or withdraw my consent to the use of telemedicine in the course of my care at any time, without affecting my right to future care or treatment, and that the Practitioner or I may terminate the telemedicine visit at any time. I understand that I have the right to inspect all information obtained and/or recorded in the course of the telemedicine visit and may receive copies of available information for a reasonable fee.  I understand that some of the potential risks of receiving the Services via telemedicine include:  Delay or interruption in medical evaluation due to technological equipment failure or disruption; Information transmitted may not be sufficient (e.g. poor  resolution of images) to allow for appropriate medical decision making by the Practitioner; and/or  In rare instances, security protocols could fail, causing a breach of personal health information.  Furthermore, I acknowledge that it is my responsibility to provide information about my medical history, conditions and care that is complete and accurate to the best of my ability. I acknowledge that Practitioner's advice, recommendations, and/or decision may be based on factors not within their control, such as incomplete or inaccurate data provided by me or distortions of diagnostic images or specimens that may result from electronic transmissions. I understand that the practice of medicine is not an exact science and that Practitioner makes no warranties or guarantees regarding treatment outcomes. I acknowledge that a copy of this consent can be made available to me via my patient portal [facility] MyChart), or I can request a printed copy by calling the office of [facility].    I understand that my insurance will be billed for this visit.   I have read or had this consent read to me. I understand the contents of this consent, which adequately explains the benefits and risks of the Services being provided via telemedicine.  I have been provided ample opportunity to ask questions regarding this consent and the Services and have had my questions answered to my satisfaction. I give my informed consent for the services to be provided through the use of telemedicine in my medical care

## 2024-06-21 NOTE — Telephone Encounter (Signed)
   Pre-operative Risk Assessment    Patient Name: Ryan Scott  DOB: May 23, 1951 MRN: [id number]      Request for Surgical Clearance    Procedure:  Right Ureteroscopy Stone Removal   Date of Surgery:  Clearance 07/16/24                                 Surgeon: Dr. Donnice Brooks Surgeon's Group or Practice Name: [facility] Phone number: [phone] Fax number: [phone]   Type of Clearance Requested:   - Medical  - Pharmacy:  Hold Clopidogrel  (Plavix ) 5 days prior   Type of Anesthesia:  General    Additional requests/questions:     [name]   06/21/2024, 3:11 PM

## 2024-06-21 NOTE — Telephone Encounter (Signed)
 Pt has been scheduled tele preop appt 06/27/24. Med rec and consent are done.

## 2024-06-26 NOTE — Telephone Encounter (Signed)
 Sorry Dr Avram I will send to Dr Legrand

## 2024-06-27 ENCOUNTER — Ambulatory Visit: Attending: Internal Medicine

## 2024-06-27 DIAGNOSIS — Z01810 Encounter for preprocedural cardiovascular examination: Secondary | ICD-10-CM | POA: Diagnosis not present

## 2024-06-27 DIAGNOSIS — N132 Hydronephrosis with renal and ureteral calculous obstruction: Secondary | ICD-10-CM | POA: Diagnosis not present

## 2024-06-27 DIAGNOSIS — E291 Testicular hypofunction: Secondary | ICD-10-CM | POA: Diagnosis not present

## 2024-06-27 DIAGNOSIS — N200 Calculus of kidney: Secondary | ICD-10-CM | POA: Diagnosis not present

## 2024-06-27 DIAGNOSIS — R399 Unspecified symptoms and signs involving the genitourinary system: Secondary | ICD-10-CM | POA: Diagnosis not present

## 2024-06-27 NOTE — Progress Notes (Signed)
 Virtual Visit via Telephone Note   Because of ZAKYE BABY co-morbid illnesses, he is at least at moderate risk for complications without adequate follow up.  This format is felt to be most appropriate for this patient at this time.  Due to technical limitations with video connection Web designer), today's appointment will be conducted as an audio only telehealth visit, and LATHANIEL LEGATE verbally agreed to proceed in this manner.   All issues noted in this document were discussed and addressed.  No physical exam could be performed with this format.  Evaluation Performed:  Preoperative cardiovascular risk assessment _____________   Date:  06/27/2024   Patient ID:  JAELYNN CURRIER, DOB [birth year], MRN [id number] Patient Location:  Home Provider location:   Office  Primary Care Provider:  Dayna Motto, DO Primary Cardiologist:  Debby Sor, MD (Inactive)  Chief Complaint / Patient Profile   73 y.o. y/o male with a h/o  CAD s/p DES to LAD, HLD, HTN, bladder cancer  who is pending Lithotripsy  and presents today for telephonic preoperative cardiovascular risk assessment.  History of Present Illness    MACEDONIO SCALLON is a 73 y.o. male who presents via audio/video conferencing for a telehealth visit today.  Pt was last seen in cardiology clinic on 08/01/2023 by Dr. Sor.  At that time ZIQUAN FIDEL was doing well with no new cardiac complaints.. The patient is now pending procedure as outlined above. Since his last visit, he reports that he is still doing good from a cardiac standpoint with no new complaints.  He denies chest pain, shortness of breath, lower extremity edema, fatigue, palpitations, melena, hematuria, hemoptysis, diaphoresis, weakness, presyncope, syncope, orthopnea, and PND.    Past Medical History    Past Medical History:  Diagnosis Date   Arthritis    back, neck, shoulders (12/19/2017)   Bladder cancer (HCC)    Borderline hypertension    BPH (benign  prostatic hyperplasia)    Chronic lower back pain    Coronary artery disease 2019   stent LAD   ED (erectile dysfunction)    High cholesterol    started BP RX 12/18/2017   History of adenomatous polyp of colon    tubular adenoma   History of kidney stones    Hypertension    Hypogonadism male    Left ureteral stone    Lower urinary tract symptoms (LUTS)    RBBB (right bundle branch block)    Past Surgical History:  Procedure Laterality Date   CARDIOVASCULAR STRESS TEST  10/16/2012   normal myocardial perfusion study,  no ischemia/  not gated   COLONOSCOPY  last one 07-26-2013   CORONARY STENT INTERVENTION N/A 12/19/2017   Procedure: CORONARY STENT INTERVENTION;  Surgeon: Sor Debby LABOR, MD;  Location: [facility];  Service: Cardiovascular;  Laterality: N/A;   CYSTOSCOPY/RETROGRADE/URETEROSCOPY/STONE EXTRACTION WITH BASKET  1989   KNEE SURGERY Left 08/2021   LEFT HEART CATH AND CORONARY ANGIOGRAPHY N/A 12/19/2017   Procedure: LEFT HEART CATH AND CORONARY ANGIOGRAPHY;  Surgeon: Sor Debby LABOR, MD;  Location: [facility];  Service: Cardiovascular;  Laterality: N/A;  LAD    PROSTATE SURGERY  2002   PROSTATE SURGERY  2002   REFRACTIVE SURGERY Bilateral    SHOULDER ARTHROSCOPY WITH SUBACROMIAL DECOMPRESSION, ROTATOR CUFF REPAIR AND BICEP TENDON REPAIR Left 2013   SHOULDER SURGERY  09/2020   rotator cuff surgery   TENOLYSIS Left 03/26/2024   Procedure: INCISION, TENDON SHEATH, Dequarvains left thumb;  Surgeon: Josefina Chew, MD;  Location: [facility];  Service: Orthopedics;  Laterality: Left;   TONSILLECTOMY  ~ 1956   TRANSTHORACIC ECHOCARDIOGRAM  10/16/2012   normal LVF, ef 55-65%,  mild AV sclerosis without stenosis,  mild MR and TR,  mild LAE,  trivial PR   TRANSURETHRAL RESECTION OF BLADDER TUMOR WITH GYRUS (TURBT-GYRUS)  1989   and URETEROSCOPIC STONE EXTRACTION    Allergies  No Known Allergies  Home Medications    Prior to Admission  medications   Medication Sig Start Date End Date Taking? Authorizing Provider  allopurinol  (ZYLOPRIM ) 100 MG tablet Take 100 mg by mouth daily.    [provider]  aspirin  81 MG chewable tablet Chew 1 tablet (81 mg total) by mouth daily. 12/21/17   Meng, Hao, PA  atorvastatin  (LIPITOR) 40 MG tablet TAKE 1 TABLET BY MOUTH EVERY DAY 11/02/23   Burnard Debby LABOR, MD  bisacodyl  5 MG EC tablet Take 5 mg by mouth daily as needed for moderate constipation.    [provider]  clopidogrel  (PLAVIX ) 75 MG tablet TAKE 1 TABLET BY MOUTH EVERY DAY 11/13/23   Burnard Debby LABOR, MD  Cyanocobalamin  (VITAMIN B12) 1000 MCG TBCR Take 1 tablet by mouth daily.    [provider]  ezetimibe  (ZETIA ) 10 MG tablet TAKE 1 TABLET BY MOUTH EVERY DAY 01/24/24   Burnard Debby LABOR, MD  HYDROcodone -acetaminophen  (NORCO) 10-325 MG tablet Take 1 tablet by mouth every 4 (four) hours as needed for severe pain (pain score 7-10). 03/26/24   Brown, Blaine K, PA-C  isosorbide  mononitrate (IMDUR ) 30 MG 24 hr tablet TAKE 1/2 TABLET (15 MG TOTAL) BY MOUTH DAILY. 12/14/23   Burnard Debby LABOR, MD  metoprolol  tartrate (LOPRESSOR ) 25 MG tablet TAKE 1/2 TABLET TWICE A DAY BY MOUTH 08/02/23   Burnard Debby LABOR, MD  nitroGLYCERIN  (NITROSTAT ) 0.4 MG SL tablet Place 1 tablet (0.4 mg total) under the tongue every 5 (five) minutes as needed for chest pain. 08/16/18 06/21/24  Burnard Debby LABOR, MD  polyethylene glycol (MIRALAX  / GLYCOLAX ) 17 g packet Take 34 g by mouth daily.    [provider]  testosterone  cypionate (DEPOTESTOTERONE CYPIONATE) 200 MG/ML injection Inject 200 mg into the muscle every 28 (twenty-eight) days.  12/04/12   [provider]    Physical Exam    Vital Signs:  AQUAN KOPE does not have vital signs available for review today.  Given telephonic nature of communication, physical exam is limited. AAOx3. NAD. Normal affect.  Speech and respirations are unlabored.  Accessory Clinical Findings     None   Assessment & Plan    1.  Preoperative Cardiovascular Risk Assessment: - Patient's RCRI score is 0.9%  The patient affirms he has been doing well without any new cardiac symptoms. They are able to achieve 7 METS without cardiac limitations. Therefore, based on ACC/AHA guidelines, the patient would be at acceptable risk for the planned procedure without further cardiovascular testing. The patient was advised that if he develops new symptoms prior to surgery to contact our office to arrange for a follow-up visit, and he verbalized understanding.   The patient was advised that if he develops new symptoms prior to surgery to contact our office to arrange for a follow-up visit, and he verbalized understanding.  Patient can hold Plavix  5 days prior to procedure and should restart postprocedure when surgically safe and advised by performing provider.  A copy of this note will be routed to requesting  Careers adviser.  Time:   Today, I have spent 6 minutes with the patient with telehealth technology discussing medical history, symptoms, and management plan.     Wyn Raddle, Jackee Shove, NP  06/27/2024, 7:44 AM

## 2024-06-27 NOTE — Telephone Encounter (Signed)
 This man has had 2 years of intractable constipation that has had little or no improvement with every available prescription medicine and over-the-counter treatment we have tried.  I referred him to [facility] where he was seen last summer.  It is not clear whether or not he has scheduled follow-up with them and what other treatment they may be offering.  My last recommendation to him before that was that if there was not further specific testing or treatment planned at that specialty clinic, then he should remain on MiraLAX  twice daily and do a periodic bowel purge as you recently advised.  VEAR Brand MD

## 2024-07-01 ENCOUNTER — Other Ambulatory Visit: Payer: Self-pay | Admitting: Urology

## 2024-07-02 ENCOUNTER — Encounter (HOSPITAL_COMMUNITY): Payer: Self-pay | Admitting: Urology

## 2024-07-02 NOTE — Progress Notes (Addendum)
 LITHO PREOP PHONE CALL   ALLERGIES REVIEWED: YES  MEDICATION REVIEW DONE: YES MEDICATIONS THAT PT SHOULD HOLD (LIST): Plavix  hold 5 days last dose 10/7  ASA hold 72 hr  CAN PT WALK UP STAIRS WITHOUT SHORTNESS OF BREATH: YES HOME O2: NO CPAP: NO  IF YES, INFORMED PT TO BRING CPAP WITH TUBING AND MASK:YES/NO   INFORMED DRIVER NEEDED FOR PROCEDURE: YES- WIFE   PT WAS GIVEN BLUE FOLDER AT UROLOGY APPT: YES PT INFORMED TO BRING BLUE FOLDER WITH ALL CONTENTS: YES  REVIEWED ARRIVAL TIME AND LOCATION: YES 0700 at [facility] Entrance  OTHER PERTINENT INFORMATION: Plavix  hold 5 days last dose 10/7 hold ASA 72 hr

## 2024-07-08 ENCOUNTER — Encounter (HOSPITAL_COMMUNITY): Payer: Self-pay | Admitting: Urology

## 2024-07-08 ENCOUNTER — Ambulatory Visit (HOSPITAL_COMMUNITY)

## 2024-07-08 ENCOUNTER — Other Ambulatory Visit: Payer: Self-pay

## 2024-07-08 ENCOUNTER — Encounter (HOSPITAL_COMMUNITY)
Admission: RE | Disposition: A | Discharge status: Home / Self Care | Payer: Self-pay | Source: Home / Self Care | Attending: Urology

## 2024-07-08 ENCOUNTER — Ambulatory Visit (HOSPITAL_COMMUNITY)
Admission: RE | Admit: 2024-07-08 | Discharge: 2024-07-08 | Disposition: A | Discharge status: Home / Self Care | Attending: Urology | Admitting: Urology

## 2024-07-08 DIAGNOSIS — N201 Calculus of ureter: Secondary | ICD-10-CM | POA: Insufficient documentation

## 2024-07-08 DIAGNOSIS — K8020 Calculus of gallbladder without cholecystitis without obstruction: Secondary | ICD-10-CM | POA: Diagnosis not present

## 2024-07-08 DIAGNOSIS — I878 Other specified disorders of veins: Secondary | ICD-10-CM | POA: Diagnosis not present

## 2024-07-08 DIAGNOSIS — N202 Calculus of kidney with calculus of ureter: Secondary | ICD-10-CM | POA: Diagnosis not present

## 2024-07-08 HISTORY — PX: EXTRACORPOREAL SHOCK WAVE LITHOTRIPSY: SHX1557

## 2024-07-08 SURGERY — LITHOTRIPSY, ESWL
Anesthesia: LOCAL | Laterality: Right

## 2024-07-08 MED ORDER — SODIUM CHLORIDE 0.9 % IV SOLN
INTRAVENOUS | Status: DC
  Administered 2024-07-08: 500 mL via INTRAVENOUS

## 2024-07-08 MED ORDER — TAMSULOSIN HCL 0.4 MG PO CAPS: 0.4000 mg | ORAL_CAPSULE | Freq: Every day | ORAL | 0 refills | Status: AC

## 2024-07-08 MED ORDER — DIAZEPAM 5 MG PO TABS
5.0000 mg | ORAL_TABLET | ORAL | Status: AC
  Administered 2024-07-08: 5 mg via ORAL
  Filled 2024-07-08: qty 1

## 2024-07-08 MED ORDER — ONDANSETRON HCL 4 MG PO TABS: 4.0000 mg | ORAL_TABLET | Freq: Every day | ORAL | 1 refills | Status: AC | PRN

## 2024-07-08 MED ORDER — DIPHENHYDRAMINE HCL 25 MG PO CAPS
25.0000 mg | ORAL_CAPSULE | ORAL | Status: AC
  Administered 2024-07-08: 25 mg via ORAL
  Filled 2024-07-08: qty 1

## 2024-07-08 MED ORDER — CIPROFLOXACIN HCL 500 MG PO TABS
500.0000 mg | ORAL_TABLET | ORAL | Status: AC
  Administered 2024-07-08: 500 mg via ORAL
  Filled 2024-07-08: qty 1

## 2024-07-08 MED ORDER — DIAZEPAM 5 MG PO TABS: 10.0000 mg | ORAL_TABLET | ORAL | Status: DC

## 2024-07-08 NOTE — H&P (Signed)
See scanned [facility] documents for H&P.   

## 2024-07-08 NOTE — Op Note (Signed)
 ESWL Operative Note  Treating Physician: Lonni Han, MD  Pre-op diagnosis: 8 mm right mid ureteral stone  Post-op diagnosis: Same   Procedure: RIGHT ESWL  See Norita Dustman OP note scanned into chart. Also because of the size, density, location and other factors that cannot be anticipated I feel this will likely be a staged procedure. This fact supersedes any indication in the scanned [place] [facility] operative note to the contrary

## 2024-07-09 ENCOUNTER — Encounter (HOSPITAL_COMMUNITY): Payer: Self-pay | Admitting: Urology

## 2024-07-09 ENCOUNTER — Encounter (HOSPITAL_COMMUNITY)

## 2024-07-10 DIAGNOSIS — E7800 Pure hypercholesterolemia, unspecified: Secondary | ICD-10-CM | POA: Diagnosis not present

## 2024-07-10 DIAGNOSIS — Z23 Encounter for immunization: Secondary | ICD-10-CM | POA: Diagnosis not present

## 2024-07-10 DIAGNOSIS — E538 Deficiency of other specified B group vitamins: Secondary | ICD-10-CM | POA: Diagnosis not present

## 2024-07-10 DIAGNOSIS — N1831 Chronic kidney disease, stage 3a: Secondary | ICD-10-CM | POA: Diagnosis not present

## 2024-07-10 DIAGNOSIS — I2510 Atherosclerotic heart disease of native coronary artery without angina pectoris: Secondary | ICD-10-CM | POA: Diagnosis not present

## 2024-07-16 ENCOUNTER — Ambulatory Visit (HOSPITAL_COMMUNITY): Admit: 2024-07-16 | Admitting: Urology

## 2024-07-16 SURGERY — CYSTOSCOPY/URETEROSCOPY/HOLMIUM LASER/STENT PLACEMENT
Anesthesia: General | Laterality: Right

## 2024-07-23 DIAGNOSIS — R399 Unspecified symptoms and signs involving the genitourinary system: Secondary | ICD-10-CM | POA: Diagnosis not present

## 2024-07-23 DIAGNOSIS — N200 Calculus of kidney: Secondary | ICD-10-CM | POA: Diagnosis not present

## 2024-07-23 DIAGNOSIS — E291 Testicular hypofunction: Secondary | ICD-10-CM | POA: Diagnosis not present

## 2024-07-25 LAB — COMPREHENSIVE METABOLIC PANEL WITH GFR
ALT: 22 IU/L (ref 0–44)
AST: 19 IU/L (ref 0–40)
Albumin: 4.3 g/dL (ref 3.8–4.8)
Alkaline Phosphatase: 75 IU/L (ref 47–123)
BUN/Creatinine Ratio: 11 (ref 10–24)
BUN: 19 mg/dL (ref 8–27)
Bilirubin Total: 1.3 mg/dL — ABNORMAL HIGH (ref 0.0–1.2)
CO2: 21 mmol/L (ref 20–29)
Calcium: 9.4 mg/dL (ref 8.6–10.2)
Chloride: 104 mmol/L (ref 96–106)
Creatinine, Ser: 1.79 mg/dL — ABNORMAL HIGH (ref 0.76–1.27)
Globulin, Total: 2.6 g/dL (ref 1.5–4.5)
Glucose: 94 mg/dL (ref 70–99)
Potassium: 4.7 mmol/L (ref 3.5–5.2)
Sodium: 140 mmol/L (ref 134–144)
Total Protein: 6.9 g/dL (ref 6.0–8.5)
eGFR: 39 mL/min/1.73 — ABNORMAL LOW (ref >59)

## 2024-07-25 LAB — LIPID PANEL
Chol/HDL Ratio: 3.2 ratio (ref 0.0–5.0)
Cholesterol, Total: 105 mg/dL (ref 100–199)
HDL: 33 mg/dL — ABNORMAL LOW (ref >39)
LDL Chol Calc (NIH): 53 mg/dL (ref 0–99)
Triglycerides: 100 mg/dL (ref 0–149)
VLDL Cholesterol Cal: 19 mg/dL (ref 5–40)

## 2024-07-25 LAB — HEMOGLOBIN A1C
Est. average glucose Bld gHb Est-mCnc: 120 mg/dL
Hgb A1c MFr Bld: 5.8 % — ABNORMAL HIGH (ref 4.8–5.6)

## 2024-07-30 DIAGNOSIS — K582 Mixed irritable bowel syndrome: Secondary | ICD-10-CM | POA: Diagnosis not present

## 2024-08-01 ENCOUNTER — Ambulatory Visit: Payer: Self-pay | Admitting: Cardiology

## 2024-08-01 DIAGNOSIS — R140 Abdominal distension (gaseous): Secondary | ICD-10-CM | POA: Diagnosis not present

## 2024-08-01 DIAGNOSIS — K5900 Constipation, unspecified: Secondary | ICD-10-CM | POA: Diagnosis not present

## 2024-08-01 DIAGNOSIS — R142 Eructation: Secondary | ICD-10-CM | POA: Diagnosis not present

## 2024-08-02 ENCOUNTER — Ambulatory Visit: Attending: Cardiology | Admitting: Cardiology

## 2024-08-02 ENCOUNTER — Encounter: Payer: Self-pay | Admitting: Cardiology

## 2024-08-02 VITALS — BP 100/60 | HR 55 | Ht 69.5 in | Wt 242.2 lb

## 2024-08-02 DIAGNOSIS — I2510 Atherosclerotic heart disease of native coronary artery without angina pectoris: Secondary | ICD-10-CM

## 2024-08-02 DIAGNOSIS — Z9861 Coronary angioplasty status: Secondary | ICD-10-CM | POA: Diagnosis not present

## 2024-08-02 DIAGNOSIS — Z01810 Encounter for preprocedural cardiovascular examination: Secondary | ICD-10-CM

## 2024-08-02 DIAGNOSIS — I10 Essential (primary) hypertension: Secondary | ICD-10-CM | POA: Diagnosis not present

## 2024-08-02 DIAGNOSIS — I25119 Atherosclerotic heart disease of native coronary artery with unspecified angina pectoris: Secondary | ICD-10-CM | POA: Diagnosis not present

## 2024-08-02 DIAGNOSIS — Z789 Other specified health status: Secondary | ICD-10-CM | POA: Diagnosis not present

## 2024-08-02 DIAGNOSIS — E785 Hyperlipidemia, unspecified: Secondary | ICD-10-CM

## 2024-08-02 MED ORDER — NITROGLYCERIN 0.4 MG SL SUBL: 0.4000 mg | SUBLINGUAL_TABLET | SUBLINGUAL | 3 refills | Status: AC | PRN

## 2024-08-02 NOTE — Patient Instructions (Addendum)
 Medication Instructions:   Discontinue  aspirin  81 mg  Stop taking Isosorbide  ( Imdur )  *If you need a refill on your cardiac medications before your next appointment, please call your pharmacy*   Lab Work:  If you have labs (blood work) drawn today and your tests are completely normal, you will receive your results only by: MyChart Message (if you have MyChart) OR A paper copy in the mail If you have any lab test that is abnormal or we need to change your treatment, we will call you to review the results.   Testing/Procedures:  NOT NEEDED  Follow-Up: At [facility], you and your health needs are our priority.  As part of our continuing mission to provide you with exceptional heart care, we have created designated Provider Care Teams.  These Care Teams include your primary Cardiologist (physician) and Advanced Practice Providers (APPs -  Physician Assistants and Nurse Practitioners) who all work together to provide you with the care you need, when you need it.     Your next appointment:   12 month(s)  The format for your next appointment:   In Person  Provider:   Alm Clay, MD  Other instructions  Okay to hold 5-7 days ( plavix ) for pre-op fro surgeries /or procedures

## 2024-08-02 NOTE — Progress Notes (Signed)
 Cardiology Office Note:  .   Date:  08/07/2024  ID:  [name], DOB 02/11/1951, MRN [id number] PCP: Dayna Motto, DO  [facility] Providers Cardiologist:  Alm Clay, MD     Chief Complaint  Patient presents with   Follow-up    Annual follow-up-establish new cardiologist.  Former Dr. Burnard patient.   Coronary Artery Disease    Two-vessel disease-DES PCI to mid LAD and PTCA of distal LAD with diffuse distal RCA disease filling via collaterals.  No angina.    Patient Profile: .     DAWSON ALBERS is a very pleasant 73 y.o. male  with a PMH notable for notable for CAD-PCI with HLD but not necessarily HTN who presents here for Annual Follow-Up/New Cardiologist Follow-Up at the request of Dayna Motto, DO and his former cardiologist-Dr. Debby Burnard.  PMH CAD- PCI midLAD ( Synergy DES 3 x 16 - 3.2 mm) & PTCA distal LAD (11/2017) HLD R Hip Replacement 04/2019 Currently undergoing Kidney Stone Rx treatment     TYSHAN ENDERLE was last seen by Dr. Burnard in Nov 2024 - doing well - No changes.   Subjective  Discussed the use of AI scribe software for clinical note transcription with the patient, who gave verbal consent to proceed.  History of Present Illness Paxon Propes is a 73 year old male with coronary artery disease who presents for a follow-up visit.  He has a history of coronary artery disease with a stent placed in 2019. He reports no chest pain or pressure. His medication regimen includes atorvastatin  40 mg, Zetia  10 mg, metoprolol  tartrate 12.5 mg twice daily, and Plavix . He has not used nitroglycerin  recently and requires a refill. He experiences some swelling in his legs, particularly since his hip replacement, but it decreases overnight. No dizziness, lightheadedness, or syncope.  He recently underwent lithotripsy for kidney stones, with a noted creatinine level of 1.79, which increased to 2.0 two days post-procedure. He is advised to  hydrate well and will have follow-up labs with his primary care physician.  He has a history of irritable bowel syndrome (IBS) with symptoms of bloating and constipation for the past two years. He recently underwent a hydrogen breath test and is awaiting results. No gastrointestinal bleeding or significant heartburn.  He experienced vertigo in September 2024, initially suspected to be a stroke. He was hospitalized for two nights, underwent CT and MRI, and was diagnosed with vertigo. He completed physical therapy for several weeks and continues exercises at home. He occasionally experiences mild dizziness upon standing but no severe vertigo episodes since.  He has undergone multiple surgeries, including hip replacement last summer and wrist surgery this summer. He has also had surgeries on both shoulders and one knee in the past five years.  No shortness of breath, palpitations, stroke-like symptoms, changes in vision, or episodes of heart racing.   Cardiovascular ROS: no chest pain or dyspnea on exertion positive for - mild ankle / calf swelling  negative for - irregular heartbeat, loss of consciousness, orthopnea, palpitations, paroxysmal nocturnal dyspnea, rapid heart rate, shortness of breath, or near syncope, TIA/CVA/amaurosis fugax, claudication  ROS:  Review of Systems - Negative except recovering from Urologic procedure - Lithotrypsy; Vertigo last Sept; No mor hematuria; major issue is IBS - constipation / bloating     Objective   Current Meds  ASA 81 mg, Plavix  75 mg   Medication Sig   allopurinol  (ZYLOPRIM ) 100 MG tablet Take 100 mg by  mouth daily.   atorvastatin  (LIPITOR) 40 MG tablet TAKE 1 TABLET BY MOUTH EVERY DAY   bisacodyl  5 MG EC tablet Take 5 mg by mouth daily as needed for moderate constipation.   Cyanocobalamin  (VITAMIN B12) 1000 MCG TBCR Take 1 tablet by mouth daily.   ezetimibe  (ZETIA ) 10 MG tablet TAKE 1 TABLET BY MOUTH EVERY DAY   HYDROcodone -acetaminophen  (NORCO)  10-325 MG tablet Take 1 tablet by mouth every 4 (four) hours as needed for severe pain (pain score 7-10).   isosorbide  mononitrate (IMDUR ) 30 MG 24 hr tablet TAKE 1/2 TABLET (15 MG TOTAL) BY MOUTH DAILY.   metoprolol  tartrate (LOPRESSOR ) 25 MG tablet TAKE 1/2 TABLET TWICE A DAY BY MOUTH   nitroGLYCERIN  (NITROSTAT ) 0.4 MG SL tablet Place 1 tablet (0.4 mg total) under the tongue every 5 (five) minutes as needed for chest pain.   ondansetron  (ZOFRAN ) 4 MG tablet Take 1 tablet (4 mg total) by mouth daily as needed for nausea or vomiting.   polyethylene glycol (MIRALAX  / GLYCOLAX ) 17 g packet Take 34 g by mouth daily.   tamsulosin (FLOMAX) 0.4 MG CAPS capsule Take 1 capsule (0.4 mg total) by mouth daily.   testosterone  cypionate (DEPOTESTOTERONE CYPIONATE) 200 MG/ML injection Inject 200 mg into the muscle every 28 (twenty-eight) days.     Studies Reviewed: SABRA   EKG Interpretation Date/Time:  Friday August 02 2024 08:23:51 EST Ventricular Rate:  55 PR Interval:  214 QRS Duration:  112 QT Interval:  398 QTC Calculation: 380 R Axis:   -10  Text Interpretation: Sinus bradycardia with 1st degree A-V block Low voltage QRS Incomplete right bundle branch block When compared with ECG of 01-Aug-2023 11:24, No significant change was found Confirmed by Anner Lenis ([id number]) on 08/02/2024 8:55:17 AM   Lab Results  Component Value Date   NA 140 07/25/2024   K 4.7 07/25/2024   CREATININE 1.79 (H) 07/25/2024   EGFR 39 (L) 07/25/2024   GLUCOSE 94 07/25/2024   Lab Results  Component Value Date   CHOL 105 07/25/2024   HDL 33 (L) 07/25/2024   LDLCALC 53 07/25/2024   TRIG 100 07/25/2024   CHOLHDL 3.2 07/25/2024   Lab Results  Component Value Date   HGBA1C 5.8 (H) 07/25/2024   Lab Results  Component Value Date   WBC 10.4 06/07/2023   HGB 13.7 06/07/2023   HCT 43.0 06/07/2023   MCV 89.8 06/07/2023   PLT 297 06/07/2023   ECHO: Normal EF 55 to 65%.  No RWMA.  Mild LA dilation.  Otherwise normal.   (10/16/2012) Exercise Myoview: Excellent exercise capacity.  Exercised for 11 minutes.  13.4 METS.  Fixed inferior defect consistent with diaphragm attenuation.  Scattered PVCs - unable to do gait.  Hypertensive response to exercise.  LOW RISK.  No evidence of ischemia or infarction.  (10/16/2012) CATH: Low normal EF 50 to 55%.  Significant calcified CAD.Ost-prox LAD calcified 20% with 30% at D1 followed by 80% calcified mid LAD lesion followed by distal LAD 80% lesion after D3.  60% OM1.  50% smooth calcified proximal RCA and sequential 70%, 80% and 95% distal RCA stenosis.  Essentially subtotal occlusion with left-to-right collaterals to the PL system. => Difficult, but successful PCI of calcified mid LAD lesion with a Synergy XD 3.0 x 16 mm DES and PTCA (POBA) of the distal LAD 80% reducing to 5%.  (12/19/2017) Diagnostic        Intervention     Risk Assessment/Calculations:  Mr. Yonan's perioperative risk of a major cardiac event is 0.4% according to the Revised Cardiac Risk Index (RCRI).  Therefore, he is at low risk for perioperative complications.   His functional capacity is excellent at 8.97 METs according to the Duke Activity Status Index (DASI). Recommendations: According to ACC/AHA guidelines, no further cardiovascular testing needed.  The patient may proceed to surgery at acceptable risk.   Antiplatelet and/or Anticoagulation Recommendations: Clopidogrel  (Plavix ) can be held for 5-7 days prior to his surgery and resumed as soon as possible post op.  Restart once safe postop We are stopping aspirin . Patient is not on DOAC.       Physical Exam:   VS:  BP 100/60   Pulse (!) 55   Ht 5' 9.5 (1.765 m)   Wt 242 lb 3.2 oz (109.9 kg)   SpO2 95%   BMI 35.25 kg/m    Wt Readings from Last 3 Encounters:  08/02/24 242 lb 3.2 oz (109.9 kg)  07/08/24 237 lb 9.6 oz (107.8 kg)  03/26/24 241 lb 6.5 oz (109.5 kg)    Physical Exam VITALS: P- 55 MUSCULOSKELETAL: Heart sounds normal,  no murmurs. PULMONARY: Lungs are clear to auscultation bilaterally.   GEN: Well nourished, well groomed; in no acute distress; healthy appearing  NECK: No JVD; No carotid bruits CARDIAC: Normal S1, S2; RRR, no murmurs, rubs, gallops RESPIRATORY:  Clear to auscultation without rales, wheezing or rhonchi ; nonlabored, good air movement. ABDOMEN: Soft, non-tender, non-distended EXTREMITIES:  No edema; No deformity      ASSESSMENT AND PLAN: .    Problem List Items Addressed This Visit       Cardiology Problems   CAD S/P percutaneous coronary angioplasty (Chronic)   6 years out from PCI to the LAD and existing disease in the distal RCA. -Okay to stop aspirin  and continue Plavix  75 mg monotherapy.  (SAPT) Okay to hold Plavix  5 to 7 days preop for surgeries or procedures.      Relevant Medications   nitroGLYCERIN  (NITROSTAT ) 0.4 MG SL tablet   Coronary artery disease involving native coronary artery of native heart with angina pectoris (Chronic)   Two-vessel coronary artery disease with previous stent placement to LAD and existing RCA disease. 6 years post PCI.SABRA No recent angina.  EKG shows sinus rhythm with first-degree AV block and incomplete right bundle branch block.  Cholesterol and blood pressure well-controlled. Plavix  sufficient for cardiac protection. - Okay to stop aspirin , and Imdur  - Continue Plavix  75 mg monotherapy - Refilled nitroglycerin  prescription. - Continue atorvastatin  40 mg and Zetia  10 mg. - Continue metoprolol  tartrate 12.5 mg twice daily.       Relevant Medications   nitroGLYCERIN  (NITROSTAT ) 0.4 MG SL tablet   Other Relevant Orders   EKG 12-Lead (Completed)   Essential hypertension (Chronic)   Blood pressure well-controlled with current medication regimen. - Continue metoprolol  tartrate 12.5 mg twice daily.      Relevant Medications   nitroGLYCERIN  (NITROSTAT ) 0.4 MG SL tablet   Other Relevant Orders   EKG 12-Lead (Completed)   Hyperlipidemia  with target low density lipoprotein (LDL) cholesterol less than 55 mg/dL: Known CAD (Chronic)   Most recent lipids (ordered by PCP 07/25/2024, reviewed above) showed excellent control with an LDL of 55 on atorvastatin  40 mg daily and Zetia  10 mL daily. -Continue current management.      Relevant Medications   nitroGLYCERIN  (NITROSTAT ) 0.4 MG SL tablet     Other   Preoperative cardiovascular examination - Primary  Relevant Orders   EKG 12-Lead (Completed)   Statin intolerance (Chronic)   Has had intolerance to statin but seems to be doing okay with 40 mg Lipitor-for additional lipid management Zetia  added.               Follow-Up: Return in about 1 year (around 08/02/2025) for 1 Yr Follow-up, Routine follow up with me, [employer].  I spent 41 minutes in the care of ANTIONNE ENRIQUE today including reviewing labs (1 minute), reviewing studies (I personally reviewed the cardiac catheterization films and report-6 minutes, and then reviewed his most recent echo and Myoview from 2014-3 minutes.  Total 9 minutes), face to face time discussing treatment options (17 minutes), reviewing records from Dr. Joesphine last note as well as notes dating back to 2014 with Dr. Maye.  (5 minutes), 9 minutes dictating, and documenting in the encounter.  Patient is not a new patient to the practice but is new to me.  Extensive history reviewed.    Signed, Alm MICAEL Clay, MD, MS Alm Clay, M.D., M.S. Interventional Cardiologist  [facility] [place] Pager # [phone]

## 2024-08-06 DIAGNOSIS — R944 Abnormal results of kidney function studies: Secondary | ICD-10-CM | POA: Diagnosis not present

## 2024-08-07 ENCOUNTER — Encounter: Payer: Self-pay | Admitting: Cardiology

## 2024-08-07 DIAGNOSIS — Z01810 Encounter for preprocedural cardiovascular examination: Secondary | ICD-10-CM | POA: Insufficient documentation

## 2024-08-07 NOTE — Assessment & Plan Note (Signed)
 Blood pressure well-controlled with current medication regimen. - Continue metoprolol  tartrate 12.5 mg twice daily.

## 2024-08-07 NOTE — Assessment & Plan Note (Signed)
 Has had intolerance to statin but seems to be doing okay with 40 mg Lipitor-for additional lipid management Zetia  added.

## 2024-08-07 NOTE — Assessment & Plan Note (Signed)
 6 years out from PCI to the LAD and existing disease in the distal RCA. -Okay to stop aspirin  and continue Plavix  75 mg monotherapy.  (SAPT) Okay to hold Plavix  5 to 7 days preop for surgeries or procedures.

## 2024-08-07 NOTE — Assessment & Plan Note (Signed)
 Two-vessel coronary artery disease with previous stent placement to LAD and existing RCA disease. 6 years post PCI.SABRA No recent angina.  EKG shows sinus rhythm with first-degree AV block and incomplete right bundle branch block.  Cholesterol and blood pressure well-controlled. Plavix  sufficient for cardiac protection. - Okay to stop aspirin , and Imdur  - Continue Plavix  75 mg monotherapy - Refilled nitroglycerin  prescription. - Continue atorvastatin  40 mg and Zetia  10 mg. - Continue metoprolol  tartrate 12.5 mg twice daily.

## 2024-08-07 NOTE — Assessment & Plan Note (Signed)
 Most recent lipids (ordered by PCP 07/25/2024, reviewed above) showed excellent control with an LDL of 55 on atorvastatin  40 mg daily and Zetia  10 mL daily. -Continue current management.

## 2024-08-16 ENCOUNTER — Other Ambulatory Visit: Payer: Self-pay | Admitting: Cardiology

## 2024-08-19 MED ORDER — METOPROLOL TARTRATE 25 MG PO TABS: ORAL_TABLET | ORAL | 3 refills | Status: AC

## 2024-08-21 DIAGNOSIS — E291 Testicular hypofunction: Secondary | ICD-10-CM | POA: Diagnosis not present

## 2024-10-31 ENCOUNTER — Other Ambulatory Visit: Payer: Self-pay | Admitting: Cardiology
